# Patient Record
Sex: Male | Born: 1955 | Race: White | Hispanic: No | Marital: Married | Smoking: Never smoker
Health system: Southern US, Community
[De-identification: ages and names within clinical notes are randomized; demographics above are authoritative.]

## PROBLEM LIST (undated history)

## (undated) DIAGNOSIS — N179 Acute kidney failure, unspecified: Secondary | ICD-10-CM

## (undated) DIAGNOSIS — I10 Essential (primary) hypertension: Secondary | ICD-10-CM

## (undated) DIAGNOSIS — G4730 Sleep apnea, unspecified: Secondary | ICD-10-CM

## (undated) DIAGNOSIS — F32A Depression, unspecified: Secondary | ICD-10-CM

## (undated) SURGERY — COLONOSCOPY WITH PROPOFOL
Anesthesia: Monitor Anesthesia Care

---

## 2019-08-27 ENCOUNTER — Other Ambulatory Visit: Payer: Self-pay

## 2019-08-27 ENCOUNTER — Emergency Department (HOSPITAL_COMMUNITY)
Admission: EM | Admit: 2019-08-27 | Discharge: 2019-08-27 | Disposition: A | Discharge status: Home / Self Care | Payer: BC Managed Care – PPO | Attending: Emergency Medicine | Admitting: Emergency Medicine

## 2019-08-27 DIAGNOSIS — R040 Epistaxis: Secondary | ICD-10-CM | POA: Diagnosis not present

## 2019-08-27 DIAGNOSIS — I10 Essential (primary) hypertension: Secondary | ICD-10-CM | POA: Insufficient documentation

## 2019-08-27 MED ORDER — SILVER NITRATE-POT NITRATE 75-25 % EX MISC: 2.0000 | Freq: Once | CUTANEOUS | Status: DC

## 2019-08-27 MED ORDER — OXYMETAZOLINE HCL 0.05 % NA SOLN
1.0000 | Freq: Once | NASAL | Status: AC
  Administered 2019-08-27: 1 via NASAL
  Filled 2019-08-27: qty 30

## 2019-08-27 MED ORDER — LIDOCAINE-EPINEPHRINE (PF) 2 %-1:200000 IJ SOLN: 10.0000 mL | Freq: Once | INTRAMUSCULAR | Status: DC

## 2019-08-27 MED ORDER — LIDOCAINE-EPINEPHRINE 2 %-1:100000 IJ SOLN
20.0000 mL | Freq: Once | INTRAMUSCULAR | Status: AC
  Administered 2019-08-27: 15:00:00 20 mL via INTRADERMAL
  Filled 2019-08-27: qty 1

## 2019-08-27 NOTE — ED Provider Notes (Signed)
[facility] DEPT Provider Note   CSN: [id number] Arrival date & time: 08/27/19  1421     History Chief Complaint  Patient presents with  . Epitaxis    Nose Bleed    Adam Moreno is a 64 y.o. male.  64 y/o male with PMH HTN presenting to the ER for nose bleed. Patient reports over the past 4 days he has had intermittent nose bleed, worse at nighttime and usually stopping on its own. Reports today around 1pm having a nose bleed which did not stop so he came to the ER. Does not check BP at home. Has been taking his BP regularly. Denies nasal trauma        No past medical history on file.  There are no problems to display for this patient.        No family history on file.  Social History   Tobacco Use  . Smoking status: Not on file  Substance Use Topics  . Alcohol use: Not on file  . Drug use: Not on file    Home Medications Prior to Admission medications   Not on File    Allergies    Patient has no allergy information on record.  Review of Systems   Review of Systems  Constitutional: Negative for fever.  HENT: Positive for nosebleeds. Negative for congestion, ear discharge, sinus pressure, sinus pain and sneezing.   Respiratory: Negative for cough and shortness of breath.   Cardiovascular: Negative for chest pain.  Gastrointestinal: Negative for abdominal pain, nausea and vomiting.  Skin: Negative for rash.  Neurological: Negative for dizziness, syncope, light-headedness and headaches.  Hematological: Does not bruise/bleed easily.    Physical Exam Updated Vital Signs BP (!) 185/108   Pulse 97   Temp 98.4 F (36.9 C) (Oral)   Resp 18   SpO2 93%   Physical Exam Vitals and nursing note reviewed.  Constitutional:      General: He is not in acute distress.    Appearance: Normal appearance. He is not toxic-appearing or diaphoretic.  HENT:     Head: Normocephalic.     Nose:     Right Nostril: Epistaxis present.   Comments: Anterior nose bleed noted R nare Eyes:     Conjunctiva/sclera: Conjunctivae normal.  Pulmonary:     Effort: Pulmonary effort is normal.  Skin:    General: Skin is dry.  Neurological:     Mental Status: He is alert and oriented to person, place, and time.  Psychiatric:        Mood and Affect: Mood normal.     ED Results / Procedures / Treatments   Labs (all labs ordered are listed, but only abnormal results are displayed) Labs Reviewed - No data to display  EKG None  Radiology No results found.  Procedures .Epistaxis Management  Date/Time: 08/27/2019 3:42 PM Performed by: Alveria Apley, PA-C Authorized by: Alveria Apley, PA-C   Consent:    Consent obtained:  Verbal   Consent given by:  Patient   Risks discussed:  Bleeding, infection, nasal injury and pain   Alternatives discussed:  No treatment, alternative treatment and referral Anesthesia (see MAR for exact dosages):    Anesthesia method:  Topical application   Topical anesthetic:  Epinephrine and lidocaine gel Procedure details:    Treatment site:  R anterior   Treatment method:  Silver nitrate   Treatment complexity:  Limited   Treatment episode: recurring   Post-procedure details:  Assessment:  Bleeding stopped   Patient tolerance of procedure:  Tolerated well, no immediate complications   (including critical care time)  Medications Ordered in ED Medications  silver nitrate applicators applicator 2 Stick (has no administration in time range)  oxymetazoline (AFRIN) 0.05 % nasal spray 1 spray (1 spray Each Nare Given by Other 08/27/19 1457)  lidocaine-EPINEPHrine (XYLOCAINE W/EPI) 2 %-1:100000 (with pres) injection 20 mL (20 mLs Intradermal Given by Other 08/27/19 1457)    ED Course  I have reviewed the triage vital signs and the nursing notes.  Pertinent labs & imaging results that were available during my care of the patient were reviewed by me and considered in my medical decision making  (see chart for details).  Clinical Course as of Aug 26 1540  Fri Aug 27, 2019  1457 Patient presenting with anterior nose bleed. No lightheadedness or syncope. HTN 185/108, other vitals normal. Attempted to stop bleed with mixture of lidocaine/epinephrine and afrin nasal spray and pressure.    [KM]  [id number] Patient had improvement in the bleed with topical medications but still oozing. Had good relief with silver nitrate sticks. Sent home with return precautions.   [KM]    Clinical Course User Index [KM] Kristine Royal   MDM Rules/Calculators/A&P                      Based on review of vitals, medical screening exam, lab work and/or imaging, there does not appear to be an acute, emergent etiology for the patient's symptoms. Counseled pt on good return precautions and encouraged both PCP and ED follow-up as needed.  Prior to discharge, I also discussed incidental imaging findings with patient in detail and advised appropriate, recommended follow-up in detail.  Clinical Impression: 1. Epistaxis     Disposition: Discharge  Prior to providing a prescription for a controlled substance, I independently reviewed the patient's recent prescription history on the [facility]. The patient had no recent or regular prescriptions and was deemed appropriate for a brief, less than 3 day prescription of narcotic for acute analgesia.  This note was prepared with assistance of Systems analyst. Occasional wrong-word or sound-a-like substitutions may have occurred due to the inherent limitations of voice recognition software.  Final Clinical Impression(s) / ED Diagnoses Final diagnoses:  Epistaxis    Rx / DC Orders ED Discharge Orders    None       Kristine Royal 08/27/19 1542    Lacretia Leigh, MD 08/28/19 1343

## 2019-08-27 NOTE — ED Notes (Signed)
Pt verbalizes understanding of DC instructions. Pt belongings returned and is ambulatory out of ED.  

## 2019-08-27 NOTE — ED Triage Notes (Signed)
Pt arrived POV ambulatory into ED from home CC Nose bleed X 4 days. Pt states " every night I have one but today I just couldn't get it to stop out of right nostril. Pt reports blood is bright red but clots quickly. Pt denies facial trauma/ injury and any new meds.  Upon arrival pt had a medium sized towel soaked in blood.    Hx HTN

## 2019-08-27 NOTE — ED Notes (Signed)
Signature pad N/A

## 2019-08-27 NOTE — Discharge Instructions (Addendum)
You were seen today for a nose bleed. We applied silver nitrate sticks and a topical medication to stop the bleed. If the bleed reoccurs then spray the afrin spray and hold pressure. If the bleeding continues you may return for reevaluation. Follow up with your regular doctor for re-evaluation of your blood pressure. You may get a humidifier to keep the air moist at home and apply Vaseline for moisture. Thank you for allowing me to care for you today. Please return to the emergency department if you have new or worsening symptoms. Take your medications as instructed.

## 2019-09-06 DIAGNOSIS — D485 Neoplasm of uncertain behavior of skin: Secondary | ICD-10-CM | POA: Diagnosis not present

## 2019-09-15 DIAGNOSIS — D485 Neoplasm of uncertain behavior of skin: Secondary | ICD-10-CM | POA: Diagnosis not present

## 2020-02-28 DIAGNOSIS — Z13220 Encounter for screening for lipoid disorders: Secondary | ICD-10-CM | POA: Diagnosis not present

## 2020-02-28 DIAGNOSIS — I10 Essential (primary) hypertension: Secondary | ICD-10-CM | POA: Diagnosis not present

## 2020-02-28 DIAGNOSIS — M79605 Pain in left leg: Secondary | ICD-10-CM | POA: Diagnosis not present

## 2020-02-28 DIAGNOSIS — R7301 Impaired fasting glucose: Secondary | ICD-10-CM | POA: Diagnosis not present

## 2020-04-21 DIAGNOSIS — M5416 Radiculopathy, lumbar region: Secondary | ICD-10-CM | POA: Diagnosis not present

## 2020-04-27 ENCOUNTER — Other Ambulatory Visit: Payer: Self-pay | Admitting: Orthopedic Surgery

## 2020-04-27 DIAGNOSIS — M5416 Radiculopathy, lumbar region: Secondary | ICD-10-CM

## 2020-05-23 ENCOUNTER — Ambulatory Visit
Admission: RE | Admit: 2020-05-23 | Discharge: 2020-05-23 | Disposition: A | Discharge status: Home / Self Care | Payer: BC Managed Care – PPO | Source: Ambulatory Visit | Attending: Orthopedic Surgery | Admitting: Orthopedic Surgery

## 2020-05-23 DIAGNOSIS — M48061 Spinal stenosis, lumbar region without neurogenic claudication: Secondary | ICD-10-CM | POA: Diagnosis not present

## 2020-05-23 DIAGNOSIS — M5416 Radiculopathy, lumbar region: Secondary | ICD-10-CM

## 2020-05-23 IMAGING — MR MR LUMBAR SPINE W/O CM
4 of 5 series · 26 of 48 positions shown · non-contrast
Comparison: None.

CLINICAL DATA: Lumbar radiculopathy.  Bilateral leg numbness.

EXAM:
MRI LUMBAR SPINE WITHOUT CONTRAST
TECHNIQUE: Multiplanar, multisequence MR imaging of the lumbar spine was
performed. No intravenous contrast was administered.

[Series 3: T2 post-contrast · sagittal · 4.0mm · 0.57mm/px · 6 of 16 slices shown]
[im 1/16]
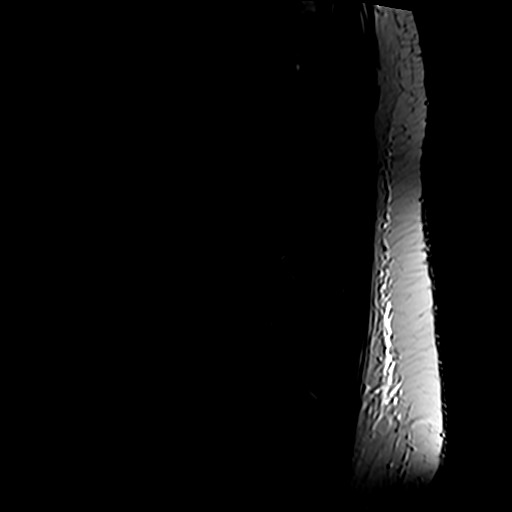
[im 4/16]
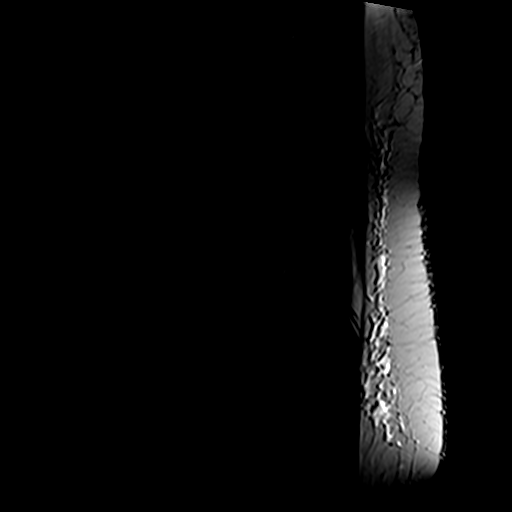
[im 7/16]
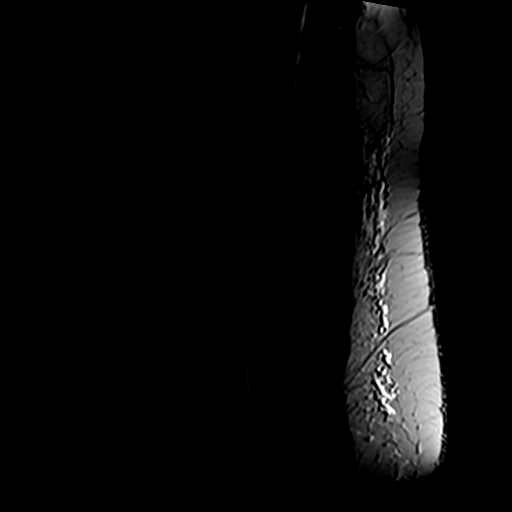
[im 10/16]
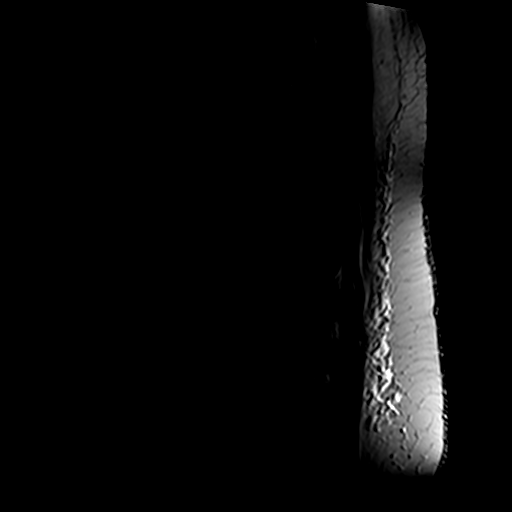
[im 13/16]
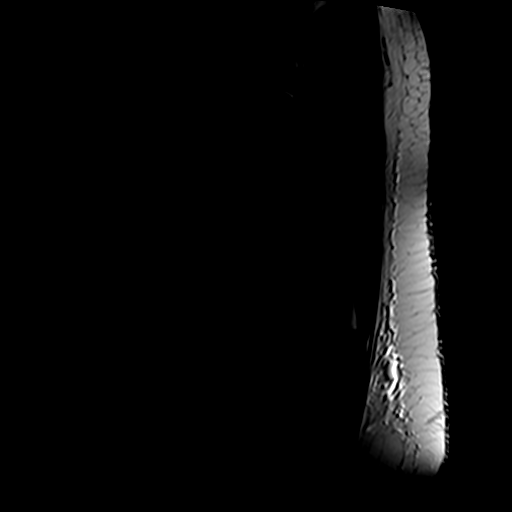
[im 16/16]
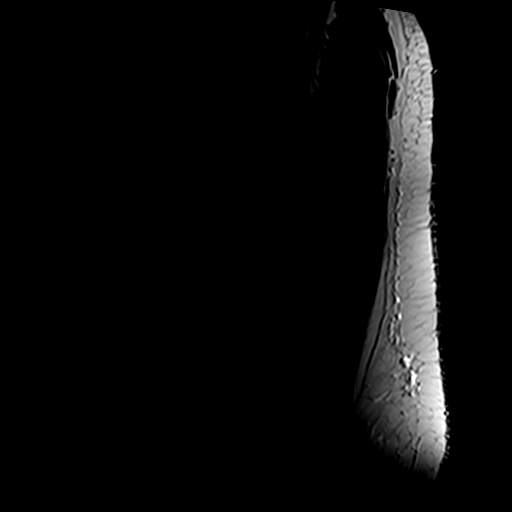

[Series 5: T1 · sagittal · 4.0mm · 0.57mm/px · 6 of 16 slices shown (1 of 2)]
[im 1/16]
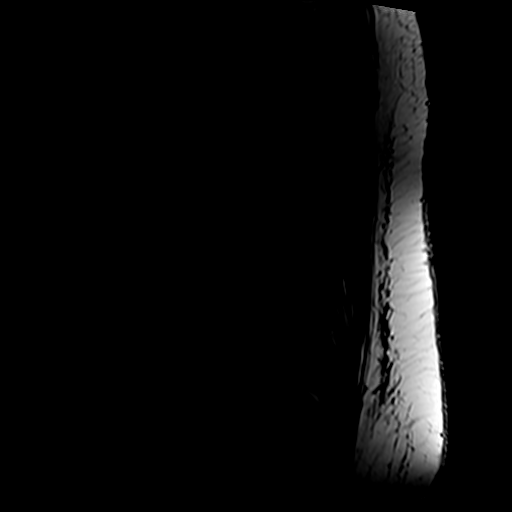
[im 4/16]
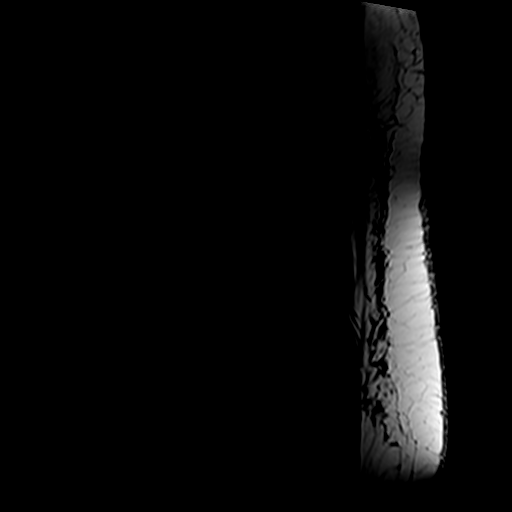
[im 7/16]
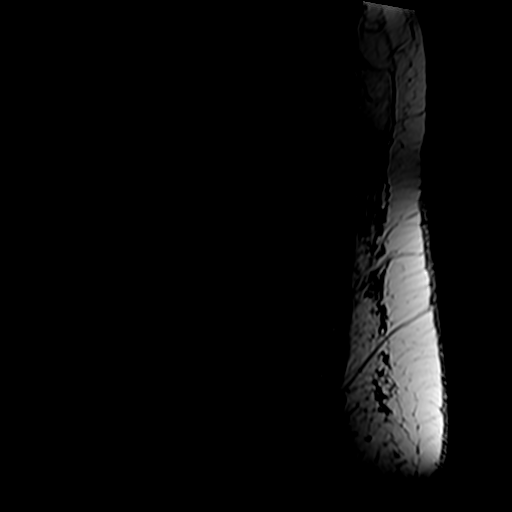
[im 10/16]
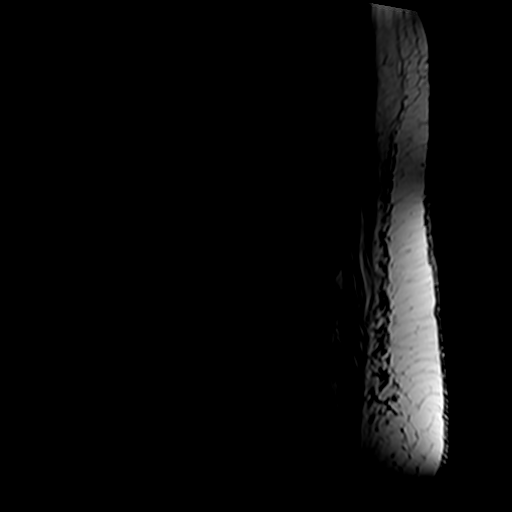
[im 13/16]
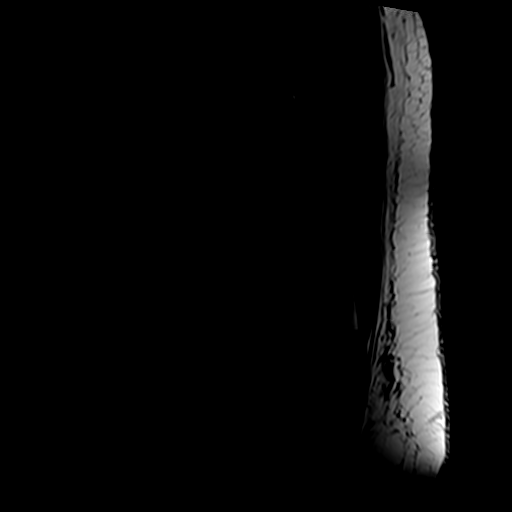
[im 16/16]
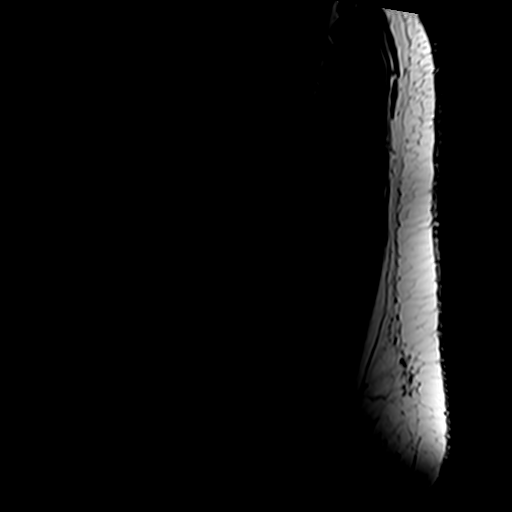

[Series 6: T2 · axial · 4.0mm · 0.70mm/px · z∈[-164,+67]mm · 9 of 41 slices shown]
[im 1/41]
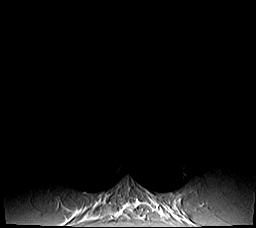
[im 6/41]
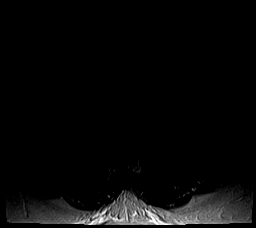
[im 12/41]
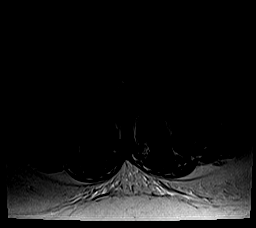
[im 18/41]
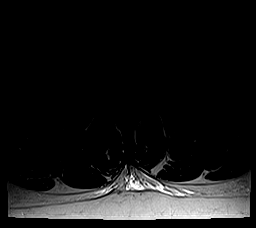
[im 21/41]
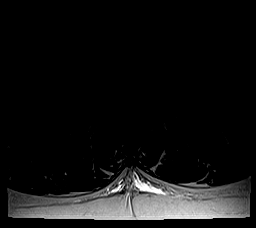
[im 23/41]
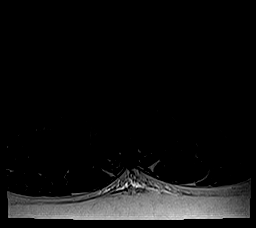
[im 29/41]
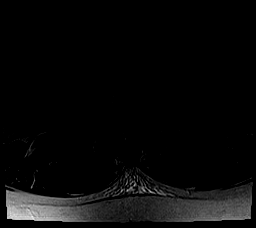
[im 35/41]
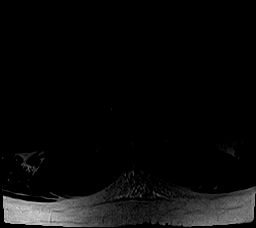
[im 41/41]
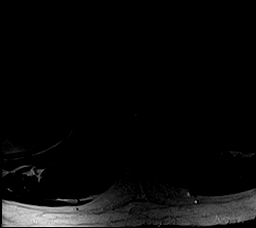

[Series 7: T1 · axial · 4.0mm · 0.35mm/px · z∈[-164,+36]mm · 5 of 42 slices shown (2 of 2)]
[im 1/42]
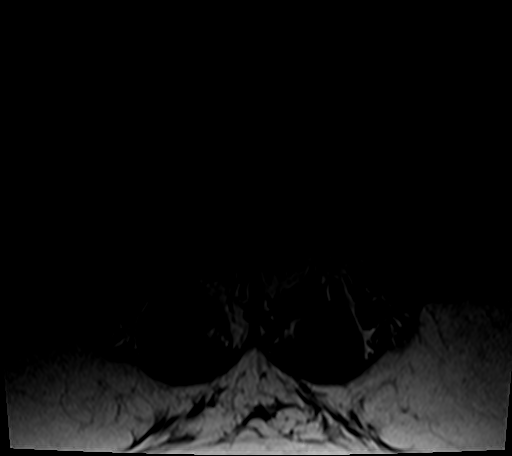
[im 6/42]
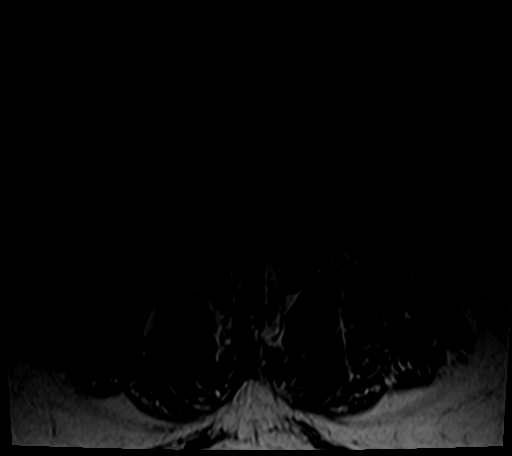
[im 12/42]
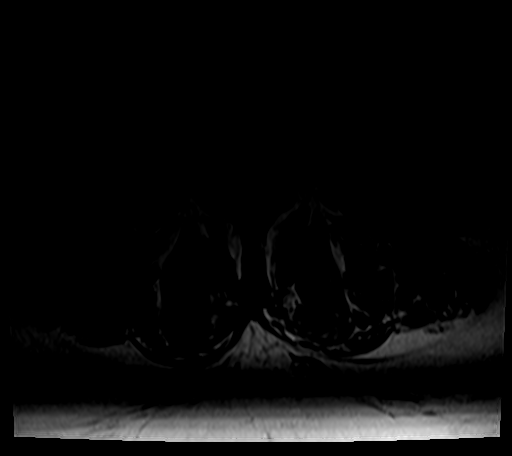
[im 21/42]
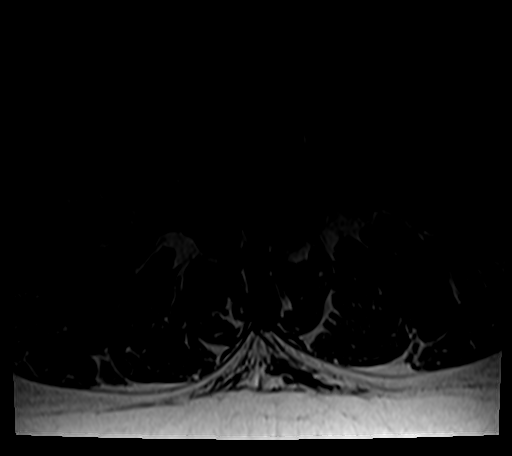
[im 36/42]
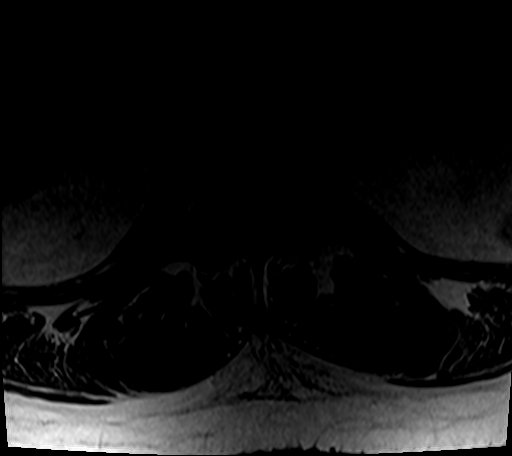

[26 of 48 positions shown; findings below may reference images not displayed]

FINDINGS: Segmentation:  Normal

Alignment:  Normal alignment with straightening of the lordosis.

Vertebrae:  Normal bone marrow.  Negative for fracture or mass.

Conus medullaris and cauda equina: Conus extends to the L1-2 level.
Conus and cauda equina appear normal.

Paraspinal and other soft tissues: Negative for paraspinous mass,
adenopathy, or fluid collection.

Disc levels:

T12-L1: Asymmetric facet hypertrophy on the left causing
subarticular stenosis.

L1-2: Normal disc. Mild facet degeneration without significant
stenosis

L2-3: Advanced facet and ligamentum flavum hypertrophy causing
moderate spinal stenosis. Mild subarticular stenosis bilaterally

L3-4: Severe facet and ligamentum flavum hypertrophy bilaterally
causing moderate to severe spinal stenosis. Moderate subarticular
stenosis bilaterally

L4-5: Mild disc degeneration and endplate spurring. Moderate facet
and ligamentum flavum hypertrophy. Mild spinal stenosis. Moderate
subarticular stenosis bilaterally

L5-S1: Small central disc protrusion. Diffuse facet hypertrophy
bilaterally. Moderate subarticular and foraminal stenosis on the
right. Mild left subarticular stenosis.
IMPRESSION: Multilevel spinal and foraminal stenosis as above. Stenosis
predominately due to multilevel facet degeneration.

## 2020-05-30 DIAGNOSIS — R7303 Prediabetes: Secondary | ICD-10-CM | POA: Diagnosis not present

## 2020-05-30 DIAGNOSIS — F3342 Major depressive disorder, recurrent, in full remission: Secondary | ICD-10-CM | POA: Diagnosis not present

## 2020-05-30 DIAGNOSIS — Z0000 Encounter for general adult medical examination without abnormal findings: Secondary | ICD-10-CM | POA: Diagnosis not present

## 2020-05-30 DIAGNOSIS — Z125 Encounter for screening for malignant neoplasm of prostate: Secondary | ICD-10-CM | POA: Diagnosis not present

## 2020-05-30 DIAGNOSIS — I10 Essential (primary) hypertension: Secondary | ICD-10-CM | POA: Diagnosis not present

## 2020-07-12 DIAGNOSIS — R202 Paresthesia of skin: Secondary | ICD-10-CM | POA: Diagnosis not present

## 2020-07-12 DIAGNOSIS — I10 Essential (primary) hypertension: Secondary | ICD-10-CM | POA: Diagnosis not present

## 2020-08-12 ENCOUNTER — Other Ambulatory Visit: Payer: Self-pay | Admitting: Gastroenterology

## 2020-09-26 ENCOUNTER — Other Ambulatory Visit: Payer: Self-pay | Admitting: Gastroenterology

## 2020-10-09 DIAGNOSIS — H6092 Unspecified otitis externa, left ear: Secondary | ICD-10-CM | POA: Diagnosis not present

## 2020-10-09 DIAGNOSIS — M26602 Left temporomandibular joint disorder, unspecified: Secondary | ICD-10-CM | POA: Diagnosis not present

## 2020-10-27 ENCOUNTER — Other Ambulatory Visit: Payer: Self-pay | Admitting: Gastroenterology

## 2020-10-30 DIAGNOSIS — I10 Essential (primary) hypertension: Secondary | ICD-10-CM | POA: Diagnosis not present

## 2020-10-30 DIAGNOSIS — R7303 Prediabetes: Secondary | ICD-10-CM | POA: Diagnosis not present

## 2020-11-01 ENCOUNTER — Encounter (HOSPITAL_COMMUNITY): Payer: Self-pay | Admitting: Gastroenterology

## 2020-11-02 ENCOUNTER — Other Ambulatory Visit: Payer: Self-pay

## 2020-11-03 ENCOUNTER — Other Ambulatory Visit (HOSPITAL_COMMUNITY)
Admission: RE | Admit: 2020-11-03 | Discharge: 2020-11-03 | Disposition: A | Discharge status: Home / Self Care | Payer: BC Managed Care – PPO | Source: Ambulatory Visit | Attending: Gastroenterology | Admitting: Gastroenterology

## 2020-11-03 DIAGNOSIS — Z01812 Encounter for preprocedural laboratory examination: Secondary | ICD-10-CM | POA: Diagnosis not present

## 2020-11-03 DIAGNOSIS — Z20822 Contact with and (suspected) exposure to covid-19: Secondary | ICD-10-CM | POA: Insufficient documentation

## 2020-11-04 LAB — SARS CORONAVIRUS 2 (TAT 6-24 HRS): SARS Coronavirus 2: NEGATIVE

## 2020-11-07 ENCOUNTER — Encounter (HOSPITAL_COMMUNITY): Payer: Self-pay | Admitting: Gastroenterology

## 2020-11-07 ENCOUNTER — Encounter (HOSPITAL_COMMUNITY)
Admission: RE | Disposition: A | Discharge status: Home / Self Care | Payer: Self-pay | Source: Ambulatory Visit | Attending: Gastroenterology

## 2020-11-07 ENCOUNTER — Other Ambulatory Visit: Payer: Self-pay

## 2020-11-07 ENCOUNTER — Ambulatory Visit (HOSPITAL_COMMUNITY)
Admission: RE | Admit: 2020-11-07 | Discharge: 2020-11-07 | Disposition: A | Discharge status: Home / Self Care | Payer: BC Managed Care – PPO | Source: Ambulatory Visit | Attending: Gastroenterology | Admitting: Gastroenterology

## 2020-11-07 ENCOUNTER — Ambulatory Visit (HOSPITAL_COMMUNITY): Payer: BC Managed Care – PPO | Admitting: Anesthesiology

## 2020-11-07 DIAGNOSIS — D124 Benign neoplasm of descending colon: Secondary | ICD-10-CM | POA: Diagnosis not present

## 2020-11-07 DIAGNOSIS — Z825 Family history of asthma and other chronic lower respiratory diseases: Secondary | ICD-10-CM | POA: Insufficient documentation

## 2020-11-07 DIAGNOSIS — K5730 Diverticulosis of large intestine without perforation or abscess without bleeding: Secondary | ICD-10-CM | POA: Insufficient documentation

## 2020-11-07 DIAGNOSIS — Z79899 Other long term (current) drug therapy: Secondary | ICD-10-CM | POA: Insufficient documentation

## 2020-11-07 DIAGNOSIS — I10 Essential (primary) hypertension: Secondary | ICD-10-CM | POA: Diagnosis not present

## 2020-11-07 DIAGNOSIS — Z1211 Encounter for screening for malignant neoplasm of colon: Secondary | ICD-10-CM | POA: Insufficient documentation

## 2020-11-07 DIAGNOSIS — D125 Benign neoplasm of sigmoid colon: Secondary | ICD-10-CM | POA: Diagnosis not present

## 2020-11-07 DIAGNOSIS — K641 Second degree hemorrhoids: Secondary | ICD-10-CM | POA: Diagnosis not present

## 2020-11-07 DIAGNOSIS — Z86010 Personal history of colonic polyps: Secondary | ICD-10-CM | POA: Diagnosis not present

## 2020-11-07 DIAGNOSIS — K640 First degree hemorrhoids: Secondary | ICD-10-CM | POA: Insufficient documentation

## 2020-11-07 DIAGNOSIS — Z8249 Family history of ischemic heart disease and other diseases of the circulatory system: Secondary | ICD-10-CM | POA: Insufficient documentation

## 2020-11-07 DIAGNOSIS — K635 Polyp of colon: Secondary | ICD-10-CM | POA: Diagnosis not present

## 2020-11-07 DIAGNOSIS — D123 Benign neoplasm of transverse colon: Secondary | ICD-10-CM | POA: Diagnosis not present

## 2020-11-07 HISTORY — PX: COLONOSCOPY WITH PROPOFOL: SHX5780

## 2020-11-07 HISTORY — PX: POLYPECTOMY: SHX5525

## 2020-11-07 HISTORY — DX: Essential (primary) hypertension: I10

## 2020-11-07 HISTORY — DX: Sleep apnea, unspecified: G47.30

## 2020-11-07 HISTORY — PX: BIOPSY: SHX5522

## 2020-11-07 SURGERY — COLONOSCOPY WITH PROPOFOL
Anesthesia: Monitor Anesthesia Care

## 2020-11-07 MED ORDER — PROPOFOL 500 MG/50ML IV EMUL
INTRAVENOUS | Status: DC | PRN
  Administered 2020-11-07: 30 mg via INTRAVENOUS

## 2020-11-07 MED ORDER — LIDOCAINE HCL (CARDIAC) PF 100 MG/5ML IV SOSY
PREFILLED_SYRINGE | INTRAVENOUS | Status: DC | PRN
  Administered 2020-11-07: 60 mg via INTRAVENOUS

## 2020-11-07 MED ORDER — SODIUM CHLORIDE 0.9 % IV SOLN: INTRAVENOUS | Status: DC

## 2020-11-07 MED ORDER — PROPOFOL 500 MG/50ML IV EMUL
INTRAVENOUS | Status: DC | PRN
  Administered 2020-11-07: 100 ug/kg/min via INTRAVENOUS

## 2020-11-07 MED ORDER — PROPOFOL 500 MG/50ML IV EMUL
INTRAVENOUS | Status: AC
  Filled 2020-11-07: qty 50

## 2020-11-07 MED ORDER — LACTATED RINGERS IV SOLN: INTRAVENOUS | Status: DC | PRN

## 2020-11-07 SURGICAL SUPPLY — 21 items

## 2020-11-07 NOTE — Anesthesia Preprocedure Evaluation (Addendum)
Anesthesia Evaluation  Patient identified by MRN, date of birth, ID band Patient awake    Reviewed: Allergy & Precautions, NPO status , Patient's Chart, lab work & pertinent test results, reviewed documented beta blocker date and time   Airway Mallampati: III  TM Distance: >3 FB Neck ROM: Full    Dental no notable dental hx. (+) Teeth Intact, Dental Advisory Given   Pulmonary sleep apnea (PCP is "certain" he has sleep apnea but hasnt had formal sleep stuydy yet) ,    Pulmonary exam normal breath sounds clear to auscultation       Cardiovascular hypertension, Pt. on medications and Pt. on home beta blockers Normal cardiovascular exam Rhythm:Regular Rate:Normal  BP in preop 174/80- per pt, this is higher than usual. hasn't taken any BP meds today    Neuro/Psych negative neurological ROS  negative psych ROS   GI/Hepatic Neg liver ROS, Hx colon polyps    Endo/Other  Morbid obesityBMI 47  Renal/GU negative Renal ROS  negative genitourinary   Musculoskeletal negative musculoskeletal ROS (+)   Abdominal (+) + obese,   Peds  Hematology negative hematology ROS (+)   Anesthesia Other Findings   Reproductive/Obstetrics negative OB ROS                            Anesthesia Physical Anesthesia Plan  ASA: III  Anesthesia Plan: MAC   Post-op Pain Management:    Induction:   PONV Risk Score and Plan: Treatment may vary due to age or medical condition, Propofol infusion and TIVA  Airway Management Planned: Natural Airway and Simple Face Mask  Additional Equipment: None  Intra-op Plan:   Post-operative Plan:   Informed Consent: I have reviewed the patients History and Physical, chart, labs and discussed the procedure including the risks, benefits and alternatives for the proposed anesthesia with the patient or authorized representative who has indicated his/her understanding and acceptance.        Plan Discussed with: CRNA  Anesthesia Plan Comments:        Anesthesia Quick Evaluation

## 2020-11-07 NOTE — H&P (Signed)
Date of Initial H&P: 10/31/20  History reviewed, patient examined, no change in status, stable for surgery.

## 2020-11-07 NOTE — Transfer of Care (Signed)
Immediate Anesthesia Transfer of Care Note  Patient: Adam Moreno  Procedure(s) Performed: Procedure(s): COLONOSCOPY WITH PROPOFOL (N/A) POLYPECTOMY BIOPSY  Patient Location: PACU  Anesthesia Type:General  Level of Consciousness:  sedated, patient cooperative and responds to stimulation  Airway & Oxygen Therapy:Patient Spontanous Breathing and Patient connected to face mask oxgen  Post-op Assessment:  Report given to PACU RN and Post -op Vital signs reviewed and stable  Post vital signs:  Reviewed and stable  Last Vitals:  Vitals:   11/07/20 1023  BP: (!) 174/80  Pulse: 80  Resp: (!) 21  Temp: 36.5 C  SpO2: 93%    Complications: No apparent anesthesia complications

## 2020-11-07 NOTE — Interval H&P Note (Signed)
History and Physical Interval Note:  11/07/2020 11:36 AM  Adam Moreno  has presented today for surgery, with the diagnosis of history of colon polyps.  The various methods of treatment have been discussed with the patient and family. After consideration of risks, benefits and other options for treatment, the patient has consented to  Procedure(s): COLONOSCOPY WITH PROPOFOL (N/A) as a surgical intervention.  The patient's history has been reviewed, patient examined, no change in status, stable for surgery.  I have reviewed the patient's chart and labs.  Questions were answered to the patient's satisfaction.     [name]

## 2020-11-07 NOTE — Anesthesia Postprocedure Evaluation (Signed)
Anesthesia Post Note  Patient: Adam Moreno  Procedure(s) Performed: COLONOSCOPY WITH PROPOFOL (N/A ) POLYPECTOMY BIOPSY     Patient location during evaluation: PACU Anesthesia Type: MAC Level of consciousness: awake and alert Pain management: pain level controlled Vital Signs Assessment: post-procedure vital signs reviewed and stable Respiratory status: spontaneous breathing, nonlabored ventilation and respiratory function stable Cardiovascular status: blood pressure returned to baseline and stable Postop Assessment: no apparent nausea or vomiting Anesthetic complications: no   No complications documented.  Last Vitals:  Vitals:   11/07/20 1241 11/07/20 1251  BP: 140/61 (!) 151/74  Pulse: 78 77  Resp: 16 (!) 22  Temp:    SpO2: 96% 97%    Last Pain:  Vitals:   11/07/20 1251  TempSrc:   PainSc: 0-No pain                 Pervis Hocking

## 2020-11-07 NOTE — Discharge Instructions (Signed)

## 2020-11-07 NOTE — Op Note (Signed)
[facility] Patient Name: Adam Moreno Procedure Date: 11/07/2020 MRN: [id number] Attending MD: [name] , MD Date of Birth: 10-Apr-1956 CSN: [id number] Age: 65 Admit Type: Outpatient Procedure:                Colonoscopy Indications:              High risk colon cancer surveillance: Personal                            history of colonic polyps, Last colonoscopy:                            September 2015 Providers:                [name], MD, Cleda Daub, RN, Benetta Spar, Technician Referring MD:             Sela Hilding Medicines:                Propofol per Anesthesia, Monitored Anesthesia Care Complications:            No immediate complications. Estimated Blood Loss:     Estimated blood loss was minimal. Procedure:                Pre-Anesthesia Assessment:                           - Prior to the procedure, a History and Physical                            was performed, and patient medications and                            allergies were reviewed. The patient's tolerance of                            previous anesthesia was also reviewed. The risks                            and benefits of the procedure and the sedation                            options and risks were discussed with the patient.                            All questions were answered, and informed consent                            was obtained. Prior Anticoagulants: The patient has                            taken no previous anticoagulant or antiplatelet  agents. ASA Grade Assessment: III - A patient with                            severe systemic disease. After reviewing the risks                            and benefits, the patient was deemed in                            satisfactory condition to undergo the procedure.                           After obtaining informed consent, the colonoscope                             was passed under direct vision. Throughout the                            procedure, the patient's blood pressure, pulse, and                            oxygen saturations were monitored continuously. The                            PCF-H190DL ([id number] Olympus pediatric colonscope                            was introduced through the anus and advanced to the                            the cecum, identified by appendiceal orifice and                            ileocecal valve. The colonoscopy was performed                            without difficulty. The patient tolerated the                            procedure well. The quality of the bowel                            preparation was fair. The terminal ileum, ileocecal                            valve, appendiceal orifice, and rectum were                            photographed. Scope In: 11:49:04 AM Scope Out: 12:25:32 PM Scope Withdrawal Time: 0 hours 28 minutes 25 seconds  Total Procedure Duration: 0 hours 36 minutes 28 seconds  Findings:      The perianal and digital rectal examinations were normal.      A 14 mm polyp was found in the descending colon. The polyp  was sessile.       The polyp was removed with a hot snare. Resection and retrieval were       complete. Estimated blood loss: none.      Four sessile and semi-sessile polyps were found in the sigmoid colon and       descending colon. The polyps were 4 to 10 mm in size. These polyps were       removed with a hot snare. Resection and retrieval were complete.       Estimated blood loss: none.      A 2 mm polyp was found in the sigmoid colon. The polyp was semi-sessile.       The polyp was removed with a cold biopsy forceps. Resection and       retrieval were complete. Estimated blood loss was minimal.      A 3 mm polyp was found in the sigmoid colon. The polyp was semi-sessile.       The polyp was removed with a cold snare. Resection and retrieval were        complete. Estimated blood loss was minimal.      A 4 mm polyp was found in the transverse colon. The polyp was       semi-sessile. The polyp was removed with a hot snare. Resection and       retrieval were complete. Estimated blood loss: none.      A 20 mm polyp was found in the transverse colon. The polyp was sessile.       The polyp was removed with a hot snare. Resection and retrieval were       complete. Estimated blood loss: none.      Multiple small and large-mouthed diverticula were found in the sigmoid       colon.      Internal hemorrhoids were found during retroflexion. The hemorrhoids       were medium-sized and Grade I (internal hemorrhoids that do not       prolapse).      A tattoo was seen in the sigmoid colon. A post-polypectomy scar was       found at the tattoo site. There was no evidence of residual polyp tissue.      The terminal ileum appeared normal. Impression:               - Preparation of the colon was fair.                           - One 14 mm polyp in the descending colon, removed                            with a hot snare. Resected and retrieved.                           - Four 4 to 10 mm polyps in the sigmoid colon and                            in the descending colon, removed with a hot snare.                            Resected and retrieved.                           -  One 2 mm polyp in the sigmoid colon, removed with                            a cold biopsy forceps. Resected and retrieved.                           - One 3 mm polyp in the sigmoid colon, removed with                            a cold snare. Resected and retrieved.                           - One 4 mm polyp in the transverse colon, removed                            with a hot snare. Resected and retrieved.                           - One 20 mm polyp in the transverse colon, removed                            with a hot snare. Resected and retrieved.                           -  Diverticulosis in the sigmoid colon.                           - Internal hemorrhoids.                           - A tattoo was seen in the sigmoid colon. A                            post-polypectomy scar was found at the tattoo site.                            There was no evidence of residual polyp tissue.                           - The examined portion of the ileum was normal. Moderate Sedation:      Not Applicable - Patient had care per Anesthesia. Recommendation:           - Patient has a contact number available for                            emergencies. The signs and symptoms of potential                            delayed complications were discussed with the                            patient. Return to normal activities tomorrow.  Written discharge instructions were provided to the                            patient.                           - High fiber diet.                           - Await pathology results.                           - Repeat colonoscopy for surveillance based on                            pathology results.                           - No aspirin, ibuprofen, naproxen, or other                            non-steroidal anti-inflammatory drugs for 2 weeks. Procedure Code(s):        --- Professional ---                           [phone], Colonoscopy, flexible; with removal of                            tumor(s), polyp(s), or other lesion(s) by snare                            technique                           [id number], 15, Colonoscopy, flexible; with biopsy,                            single or multiple Diagnosis Code(s):        --- Professional ---                           Z86.010, Personal history of colonic polyps                           K63.5, Polyp of colon                           K64.0, First degree hemorrhoids                           K57.30, Diverticulosis of large intestine without                             perforation or abscess without bleeding CPT copyright 2019 [employer]. All rights reserved. The codes documented in this report are preliminary and upon coder review may  be revised to meet current compliance requirements. [name], MD 11/07/2020 12:44:26 PM This report has  been signed electronically. Number of Addenda: 0

## 2020-11-08 ENCOUNTER — Encounter (HOSPITAL_COMMUNITY): Payer: Self-pay | Admitting: Gastroenterology

## 2020-11-08 LAB — SURGICAL PATHOLOGY

## 2021-06-08 DIAGNOSIS — R7303 Prediabetes: Secondary | ICD-10-CM | POA: Diagnosis not present

## 2021-06-08 DIAGNOSIS — Z0000 Encounter for general adult medical examination without abnormal findings: Secondary | ICD-10-CM | POA: Diagnosis not present

## 2021-06-08 DIAGNOSIS — Z13220 Encounter for screening for lipoid disorders: Secondary | ICD-10-CM | POA: Diagnosis not present

## 2021-06-08 DIAGNOSIS — F3342 Major depressive disorder, recurrent, in full remission: Secondary | ICD-10-CM | POA: Diagnosis not present

## 2021-06-08 DIAGNOSIS — I10 Essential (primary) hypertension: Secondary | ICD-10-CM | POA: Diagnosis not present

## 2021-06-08 DIAGNOSIS — Z23 Encounter for immunization: Secondary | ICD-10-CM | POA: Diagnosis not present

## 2021-06-08 DIAGNOSIS — R0609 Other forms of dyspnea: Secondary | ICD-10-CM | POA: Diagnosis not present

## 2021-06-08 DIAGNOSIS — Z125 Encounter for screening for malignant neoplasm of prostate: Secondary | ICD-10-CM | POA: Diagnosis not present

## 2021-06-13 NOTE — Progress Notes (Signed)
  Patient referred by Timberlake, Kathryn S, * for shortness of breath  Subjective:   Adam Moreno, male    DOB: 09/17/1955, 65 y.o.   MRN: [id number]   Chief Complaint  Patient presents with   DOE   New Patient (Initial Visit)    HPI  65 y.o. Caucasian male with hypertension, obesity, OSA not on CPAP, referred for evaluation of shortness of breath.  Patient moved to [place] from [place] 3 years ago.  He is worked in private [employer].  His physical activity is limited due to orthopedic issues, especially with his knees.  He has had exertional dyspnea for last several years with no significant recent change.  He denies any chest pain.  He has been overweight and obese for a long time.  He has mild edema, denies any orthopnea, PND symptoms.   Past Medical History:  Diagnosis Date   Hypertension    Sleep apnea    doesn't wear CPAP     Past Surgical History:  Procedure Laterality Date   BIOPSY  11/07/2020   Procedure: BIOPSY;  Surgeon: Schooler, Vincent, MD;  Location: [facility];  Service: Endoscopy;;   COLONOSCOPY WITH PROPOFOL N/A 11/07/2020   Procedure: COLONOSCOPY WITH PROPOFOL;  Surgeon: Schooler, Vincent, MD;  Location: [facility];  Service: Endoscopy;  Laterality: N/A;   POLYPECTOMY  11/07/2020   Procedure: POLYPECTOMY;  Surgeon: Schooler, Vincent, MD;  Location: [facility];  Service: Endoscopy;;     Social History   Tobacco Use  Smoking Status Never  Smokeless Tobacco Never    Social History   Substance and Sexual Activity  Alcohol Use None     Family History  Problem Relation Age of Onset   Heart attack Father 60       only 1   Hypertension Brother      Current Outpatient Medications on File Prior to Visit  Medication Sig Dispense Refill   amLODipine (NORVASC) 10 MG tablet Take 10 mg by mouth daily.     Cholecalciferol (DIALYVITE VITAMIN D 5000) 125 MCG (5000 UT) capsule Take 5,000 Units by mouth daily.     DULoxetine (CYMBALTA) 60  MG capsule Take 60 mg by mouth daily.     metoprolol succinate (TOPROL-XL) 25 MG 24 hr tablet Take 25 mg by mouth daily.     valsartan-hydrochlorothiazide (DIOVAN-HCT) 320-25 MG tablet Take 1 tablet by mouth daily.     No current facility-administered medications on file prior to visit.    Cardiovascular and other pertinent studies:  EKG 06/14/2021: Sinus rhythm 89 bpm Normal EKG     Recent labs: 06/08/2021: Glucose 110, BUN/Cr 24/0.92. EGFR 93. Na/K 3.8/101. Rest of the CMP normal H/H 15.2/44.6. MCV 85.4. Platelets 269 HbA1C 6.1% Chol 164, TG 134, HDL 35, LDL 105 TSH 2.49 normal    Review of Systems  Cardiovascular:  Positive for dyspnea on exertion and leg swelling. Negative for chest pain, palpitations and syncope.        Vitals:   06/14/21 1319  BP: 139/82  Pulse: 94  Resp: 16  Temp: 98 F (36.7 C)  SpO2: 95%     Body mass index is 45.75 kg/m. Filed Weights   06/14/21 1319  Weight: (!) 328 lb (148.8 kg)     Objective:   Physical Exam Vitals and nursing note reviewed.  Constitutional:      General: He is not in acute distress. Neck:     Vascular: No JVD.  Cardiovascular:       Rate and Rhythm: Normal rate and regular rhythm.     Heart sounds: Normal heart sounds. No murmur heard. Pulmonary:     Effort: Pulmonary effort is normal.     Breath sounds: Normal breath sounds. No wheezing or rales.  Musculoskeletal:     Right lower leg: Edema (Trace) present.     Left lower leg: Edema (Trace) present.        Assessment & Recommendations:   65 y.o. Caucasian male with hypertension, obesity, OSA not on CPAP, referred for evaluation of shortness of breath.  Exertional dyspnea: Likely related to obesity, deconditioning, untreated obstructive sleep apnea.  Low suspicion for ischemic etiology.  Will obtain echocardiogram.  Recommended Lasix for as needed use.  Discussed low-salt diet, calorie negative diet, and increase physical activity as tolerated by  his orthopedic issues.  Further recommendations after echocardiogram.   Thank you for referring the patient to us. Please feel free to contact with any questions.    J , MD Pager: [phone] Office: [phone]  

## 2021-06-14 ENCOUNTER — Ambulatory Visit: Payer: BC Managed Care – PPO | Admitting: Cardiology

## 2021-06-14 ENCOUNTER — Other Ambulatory Visit: Payer: Self-pay

## 2021-06-14 ENCOUNTER — Encounter: Payer: Self-pay | Admitting: Cardiology

## 2021-06-14 VITALS — BP 139/82 | HR 94 | Temp 98.0°F | Resp 16 | Ht 71.0 in | Wt 328.0 lb

## 2021-06-14 DIAGNOSIS — R002 Palpitations: Secondary | ICD-10-CM | POA: Diagnosis not present

## 2021-06-14 DIAGNOSIS — R0609 Other forms of dyspnea: Secondary | ICD-10-CM

## 2021-06-14 MED ORDER — FUROSEMIDE 20 MG PO TABS: 20.0000 mg | ORAL_TABLET | Freq: Every day | ORAL | 3 refills | Status: DC

## 2021-06-15 ENCOUNTER — Encounter: Payer: Self-pay | Admitting: Cardiology

## 2021-06-15 DIAGNOSIS — R0609 Other forms of dyspnea: Secondary | ICD-10-CM | POA: Insufficient documentation

## 2021-07-11 ENCOUNTER — Other Ambulatory Visit: Payer: BC Managed Care – PPO

## 2021-07-19 ENCOUNTER — Ambulatory Visit: Payer: BC Managed Care – PPO

## 2021-07-19 ENCOUNTER — Other Ambulatory Visit: Payer: Self-pay

## 2021-07-19 DIAGNOSIS — R0609 Other forms of dyspnea: Secondary | ICD-10-CM | POA: Diagnosis not present

## 2021-07-23 NOTE — Progress Notes (Signed)
Called and spoke with patient regarding his echocardiogram results.  ?

## 2021-08-07 NOTE — Progress Notes (Signed)
Patient referred by Glenis Smoker, * for shortness of breath  Subjective:   Adam Moreno, male    DOB: [birth year], 66 y.o.   MRN: [id number]   Chief Complaint  Patient presents with   Shortness of Breath   Follow-up    4 week   Results    echo    HPI  66 y.o. Caucasian male with hypertension, obesity, OSA not on CPAP, referred for evaluation of shortness of breath  Reviewed recent echocardiogram results with the patient, details below.   Initial consultation visit 06/2021: Patient moved to [facility] from [place] 3 years ago.  He works in [employer].  His physical activity is limited due to orthopedic issues, especially with his knees.  He has had exertional dyspnea for last several years with no significant recent change.  He denies any chest pain.  He has been overweight and obese for a long time.  He has mild edema, denies any orthopnea, PND symptoms.  Current Outpatient Medications on File Prior to Visit  Medication Sig Dispense Refill   amLODipine (NORVASC) 10 MG tablet Take 10 mg by mouth daily.     Cholecalciferol (DIALYVITE VITAMIN D 5000) 125 MCG (5000 UT) capsule Take 5,000 Units by mouth daily.     DULoxetine (CYMBALTA) 60 MG capsule Take 60 mg by mouth daily.     furosemide (LASIX) 20 MG tablet Take 1 tablet (20 mg total) by mouth daily. 30 tablet 3   metoprolol succinate (TOPROL-XL) 25 MG 24 hr tablet Take 25 mg by mouth daily.     valsartan-hydrochlorothiazide (DIOVAN-HCT) 320-25 MG tablet Take 1 tablet by mouth daily.     No current facility-administered medications on file prior to visit.    Cardiovascular and other pertinent studies:  EKG 06/14/2021: Sinus rhythm 89 bpm Normal EKG   Echocardiogram 07/19/2021:  Study Quality: Technically difficult study.  Normal LV systolic function with visual EF 55-60%. Left ventricle cavity  is normal in size. Moderate left ventricular hypertrophy. Normal global  wall motion. Normal  diastolic filling pattern, normal LAP.  No significant valvular heart disease.  No prior study for comparison.  Recent labs: 06/08/2021: Glucose 110, BUN/Cr 24/0.92. EGFR 93. Na/K 3.8/101. Rest of the CMP normal H/H 15.2/44.6. MCV 85.4. Platelets 269 HbA1C 6.1% Chol 164, TG 134, HDL 35, LDL 105 TSH 2.49 normal    Review of Systems  Cardiovascular:  Positive for dyspnea on exertion and leg swelling. Negative for chest pain, palpitations and syncope.        Vitals:   08/08/21 1048  BP: 124/80  Pulse: 72  Resp: 16  Temp: 98 F (36.7 C)  SpO2: 95%    Body mass index is 48.4 kg/m. Filed Weights   08/08/21 1048  Weight: (!) 347 lb (157.4 kg)     Objective:   Physical Exam Vitals and nursing note reviewed.  Constitutional:      General: He is not in acute distress. Neck:     Vascular: No JVD.  Cardiovascular:     Rate and Rhythm: Normal rate and regular rhythm.     Heart sounds: Normal heart sounds. No murmur heard. Pulmonary:     Effort: Pulmonary effort is normal.     Breath sounds: Normal breath sounds. No wheezing or rales.  Musculoskeletal:     Right lower leg: Edema (Trace) present.     Left lower leg: Edema (Trace) present.        Assessment & Recommendations:  66 y.o. Caucasian male with hypertension, obesity, OSA not on CPAP, referred for evaluation of shortness of breath.  Exertional dyspnea: Likely related to obesity, deconditioning, obstructive sleep apnea.  Low suspicion for ischemic etiology.   Echocardiogram with no significant abnormalities. Recommended Lasix for as needed use.  Discussed low-salt diet, calorie negative diet, and increase physical activity as tolerated by his orthopedic issues. Could consider pulmonary evaluation.  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 He is prediabetic with A1C 6.1%. Recommend Crestor 10 mg daily.   F/u in 6 months   Nigel Mormon, MD Pager: [phone] Office: [phone]

## 2021-08-08 ENCOUNTER — Ambulatory Visit: Payer: BC Managed Care – PPO | Admitting: Cardiology

## 2021-08-08 ENCOUNTER — Other Ambulatory Visit: Payer: Self-pay

## 2021-08-08 ENCOUNTER — Encounter: Payer: Self-pay | Admitting: Cardiology

## 2021-08-08 VITALS — BP 124/80 | HR 72 | Temp 98.0°F | Resp 16 | Ht 71.0 in | Wt 347.0 lb

## 2021-08-08 DIAGNOSIS — E782 Mixed hyperlipidemia: Secondary | ICD-10-CM | POA: Diagnosis not present

## 2021-08-08 DIAGNOSIS — R0609 Other forms of dyspnea: Secondary | ICD-10-CM | POA: Diagnosis not present

## 2021-08-08 MED ORDER — ROSUVASTATIN CALCIUM 10 MG PO TABS: 10.0000 mg | ORAL_TABLET | Freq: Every day | ORAL | 3 refills | Status: AC

## 2021-09-14 ENCOUNTER — Other Ambulatory Visit: Payer: Self-pay | Admitting: Cardiology

## 2021-09-14 DIAGNOSIS — R0609 Other forms of dyspnea: Secondary | ICD-10-CM

## 2021-11-14 ENCOUNTER — Emergency Department (HOSPITAL_COMMUNITY)
Admission: EM | Admit: 2021-11-14 | Discharge: 2021-11-14 | Disposition: A | Discharge status: Home / Self Care | Payer: BC Managed Care – PPO | Attending: Emergency Medicine | Admitting: Emergency Medicine

## 2021-11-14 ENCOUNTER — Encounter (HOSPITAL_COMMUNITY): Payer: Self-pay | Admitting: Emergency Medicine

## 2021-11-14 ENCOUNTER — Emergency Department (HOSPITAL_COMMUNITY): Payer: BC Managed Care – PPO

## 2021-11-14 DIAGNOSIS — R062 Wheezing: Secondary | ICD-10-CM | POA: Diagnosis not present

## 2021-11-14 DIAGNOSIS — D3616 Benign neoplasm of peripheral nerves and autonomic nervous system of pelvis: Secondary | ICD-10-CM | POA: Diagnosis not present

## 2021-11-14 DIAGNOSIS — D3610 Benign neoplasm of peripheral nerves and autonomic nervous system, unspecified: Secondary | ICD-10-CM | POA: Insufficient documentation

## 2021-11-14 DIAGNOSIS — R0902 Hypoxemia: Secondary | ICD-10-CM | POA: Diagnosis not present

## 2021-11-14 DIAGNOSIS — I10 Essential (primary) hypertension: Secondary | ICD-10-CM | POA: Diagnosis not present

## 2021-11-14 DIAGNOSIS — M2578 Osteophyte, vertebrae: Secondary | ICD-10-CM | POA: Diagnosis not present

## 2021-11-14 DIAGNOSIS — G459 Transient cerebral ischemic attack, unspecified: Secondary | ICD-10-CM | POA: Diagnosis not present

## 2021-11-14 DIAGNOSIS — R471 Dysarthria and anarthria: Secondary | ICD-10-CM | POA: Insufficient documentation

## 2021-11-14 DIAGNOSIS — Z8673 Personal history of transient ischemic attack (TIA), and cerebral infarction without residual deficits: Secondary | ICD-10-CM | POA: Diagnosis not present

## 2021-11-14 DIAGNOSIS — Z79899 Other long term (current) drug therapy: Secondary | ICD-10-CM | POA: Insufficient documentation

## 2021-11-14 IMAGING — CT CT HEAD W/O CM
3 series · 15 of 47 positions shown, 18 images · non-contrast
Comparison: None.

CLINICAL DATA: TIA



[Series 2: head wo · axial · 0.48mm/px · z∈[-80,+65]mm · 9 of 35 slices shown, 12 images]
[im 3/35  brain]
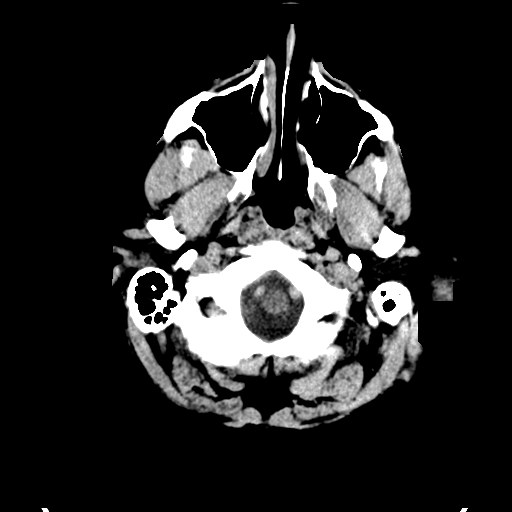
[im 3/35  bone]
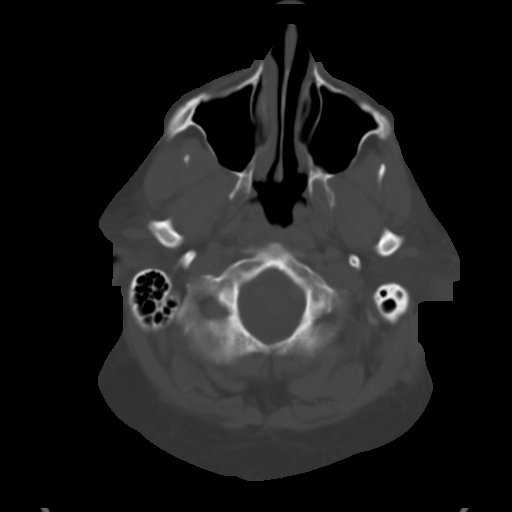
[im 6/35  brain]
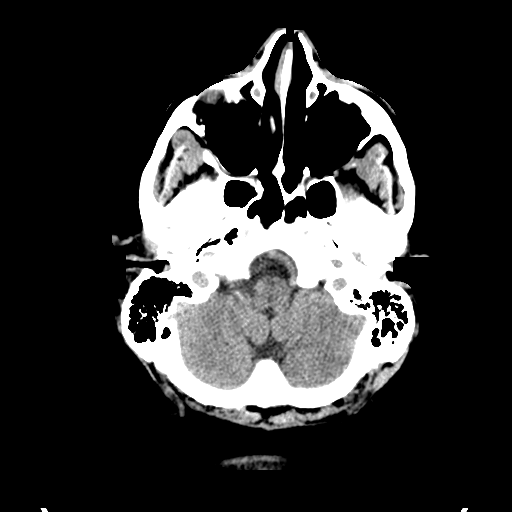
[im 10/35  brain]
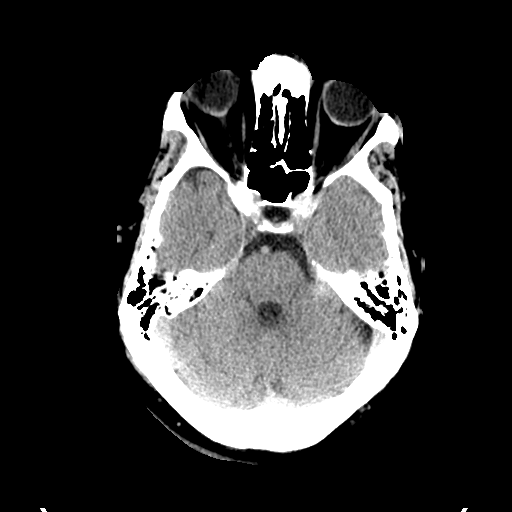
[im 13/35  brain]
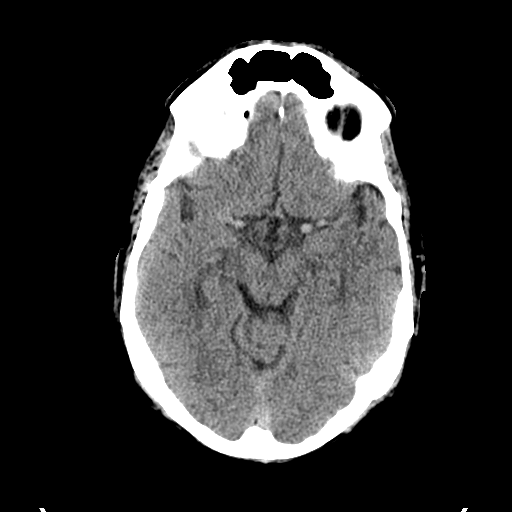
[im 18/35  brain]
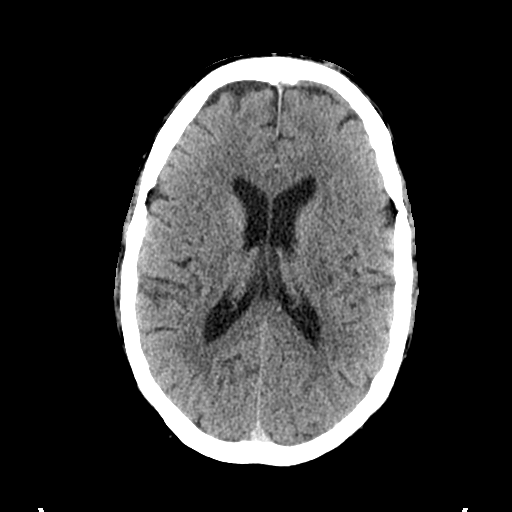
[im 18/35  bone]
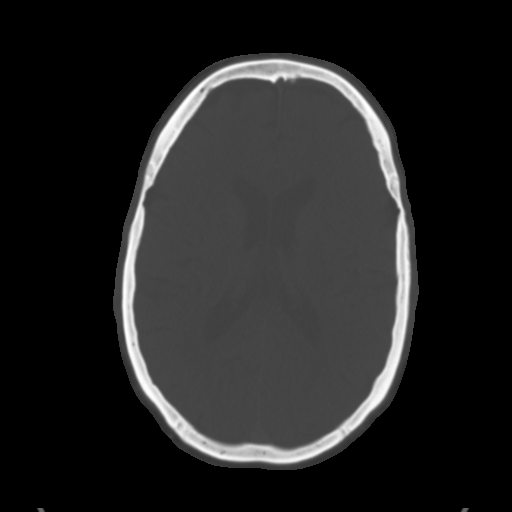
[im 22/35  brain]
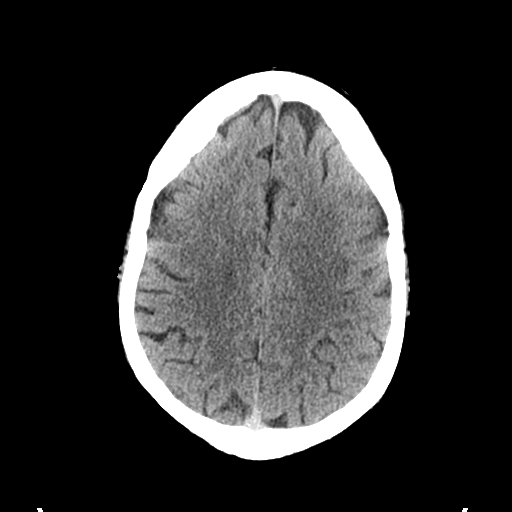
[im 25/35  brain]
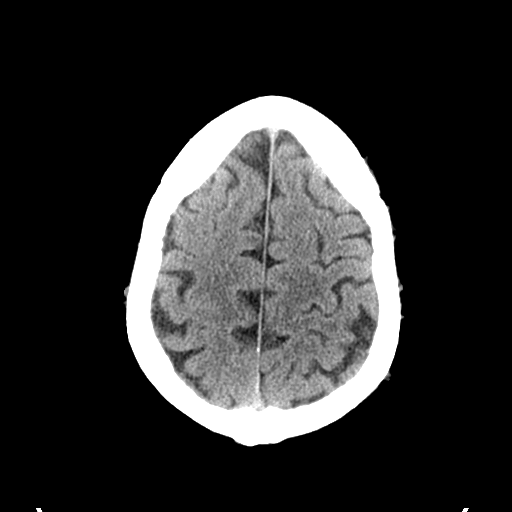
[im 29/35  brain]
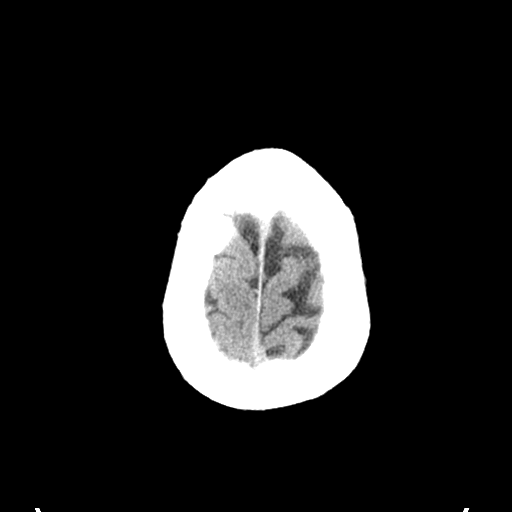
[im 32/35  brain]
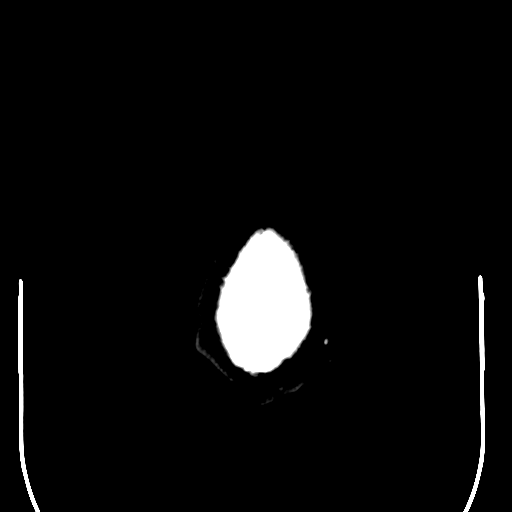
[im 32/35  bone]
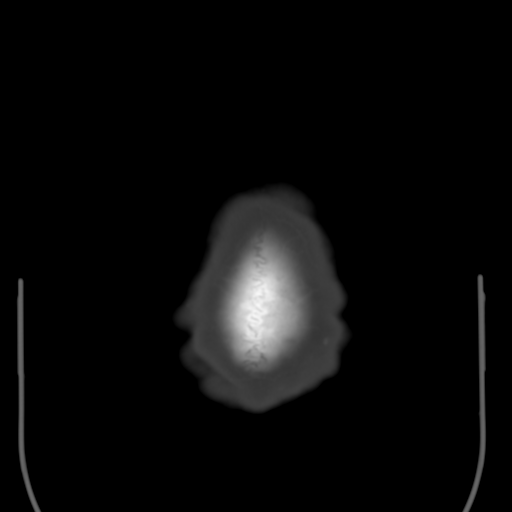

[Series 5: coronal soft tissue · coronal · 0.34mm/px · 3 of 81 slices shown]
[im 27/81  brain]
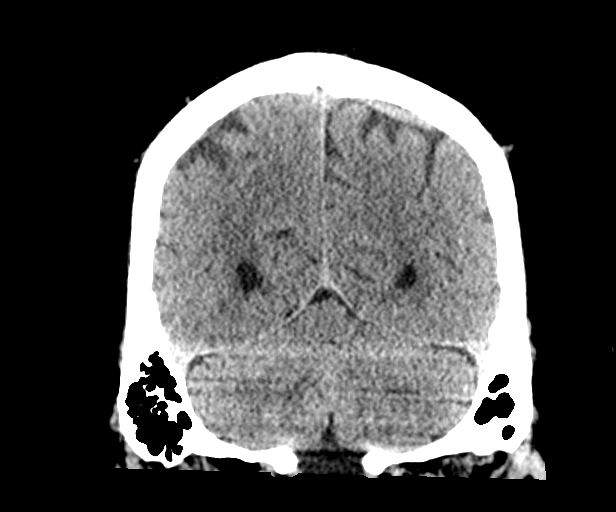
[im 36/81  brain]
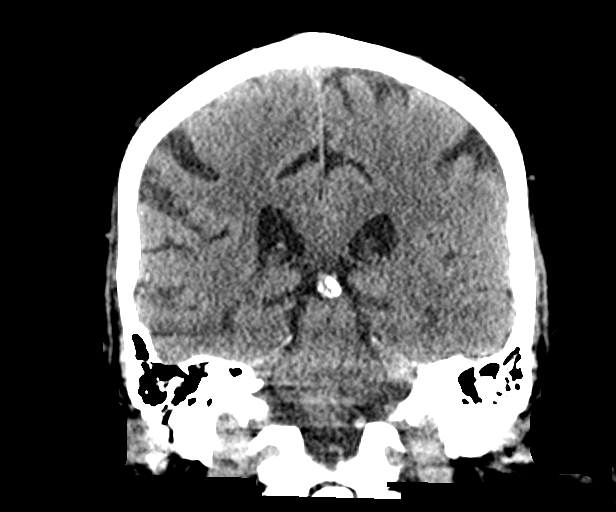
[im 45/81  brain]
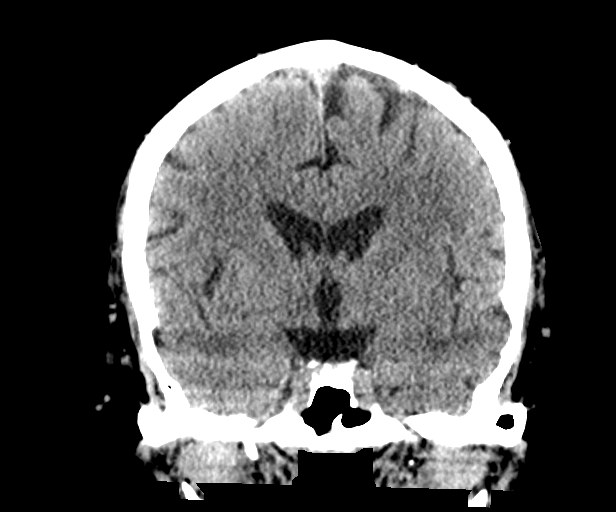

[Series 6: sagittal soft tissue · sagittal · 0.36mm/px · 3 of 61 slices shown]
[im 21/61  brain]
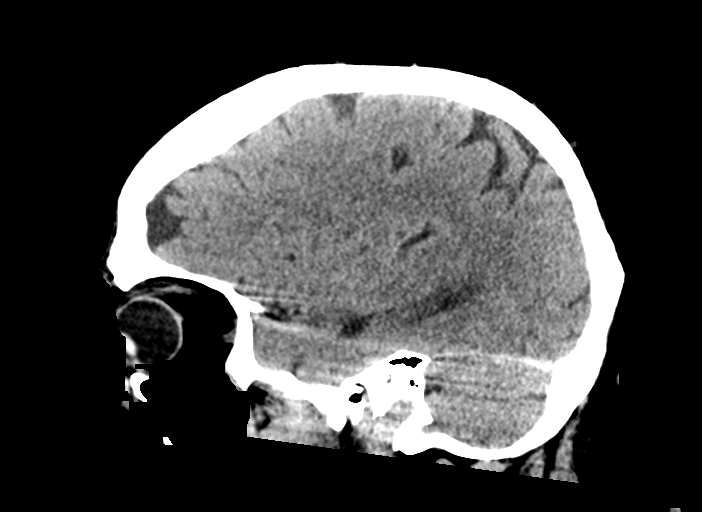
[im 31/61  brain]
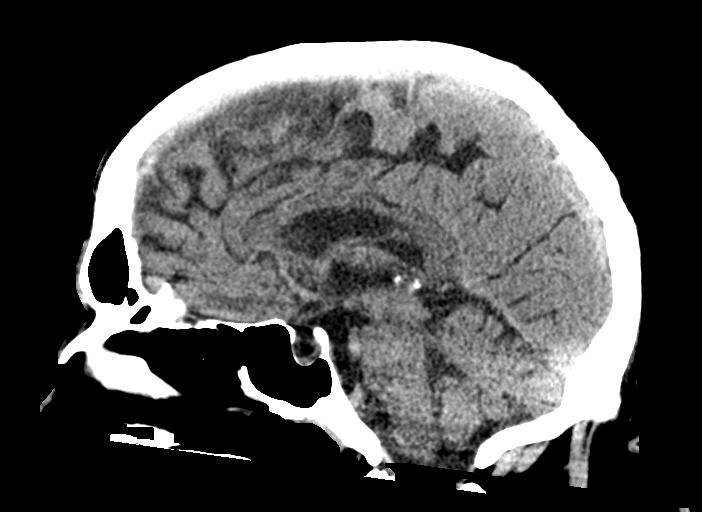
[im 41/61  brain]
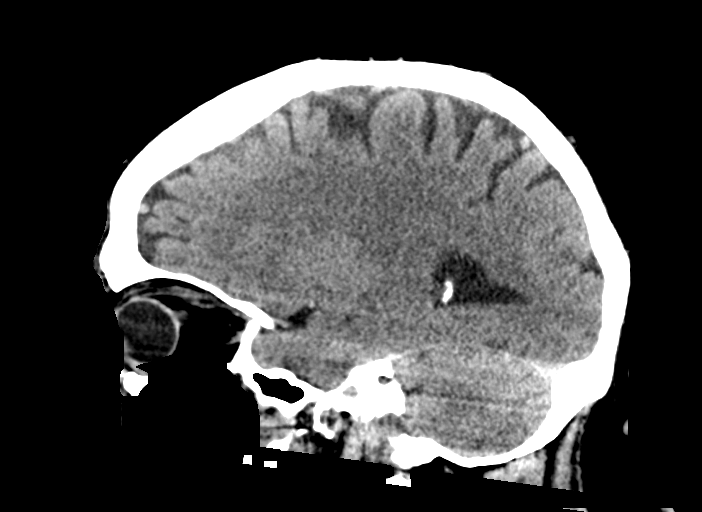

[15 of 47 positions shown; findings below may reference images not displayed]

FINDINGS: Brain: No evidence of acute infarction, hemorrhage, hydrocephalus,
extra-axial collection or mass lesion/mass effect.

Vascular: Negative for hyperdense vessel

Skull: Negative

Sinuses/Orbits: Paranasal sinuses clear.  Negative orbit

Other: None
IMPRESSION: Negative CT head

## 2021-11-14 IMAGING — MR MR HEAD W/O CM
10 series · 48 of 48 positions shown · non-contrast
Comparison: None.

CLINICAL DATA: Transient ischemic attack (TIA)

EXAM:
MRI HEAD WITHOUT CONTRAST
TECHNIQUE: Multiplanar, multiecho pulse sequences of the brain and surrounding
structures were obtained without intravenous contrast.

[Series 5: DWI · axial · 3.0mm · 1.36mm/px · z∈[-29,+111]mm · 12 of 104 slices shown (1 of 4)]
[im 1/104]
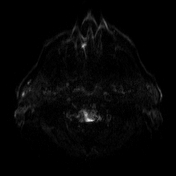
[im 10/104]
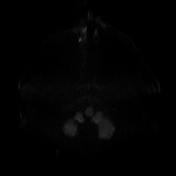
[im 19/104]
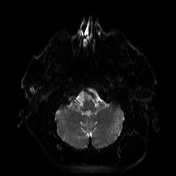
[im 29/104]
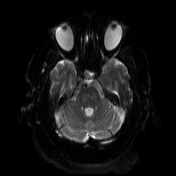
[im 38/104]
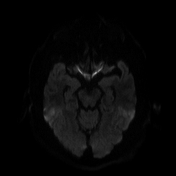
[im 47/104]
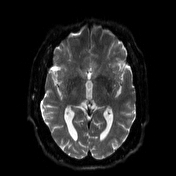
[im 57/104]
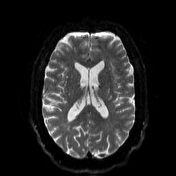
[im 66/104]
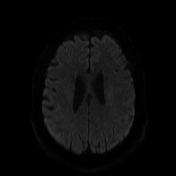
[im 75/104]
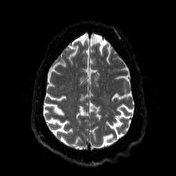
[im 85/104]
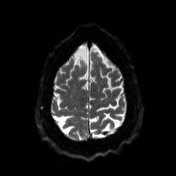
[im 94/104]
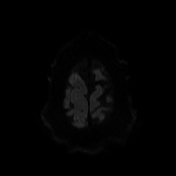
[im 104/104]
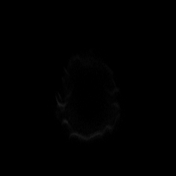

[Series 6: DWI · axial · 3.0mm · 1.36mm/px · z∈[-29,+111]mm · 5 of 52 slices shown (2 of 4)]
[im 1/52]
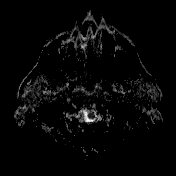
[im 13/52]
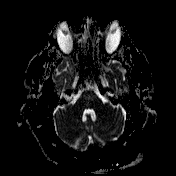
[im 26/52]
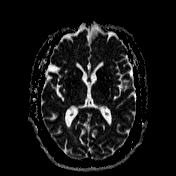
[im 39/52]
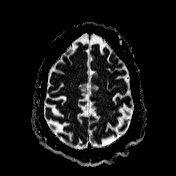
[im 52/52]
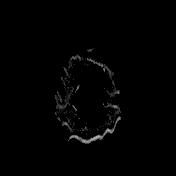

[Series 7: T1 · sagittal · 5.0mm · 0.75mm/px · 2 of 24 slices shown (1 of 2)]
[im 1/24]
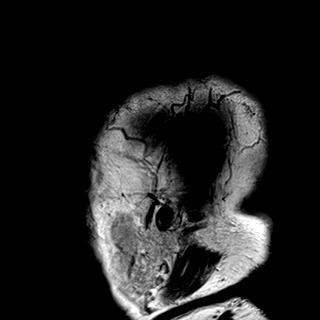
[im 24/24]
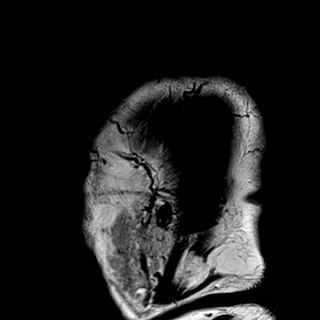

[Series 8: T2 · axial · 5.0mm · 0.65mm/px · z∈[-36,+112]mm · 3 of 26 slices shown (1 of 2)]
[im 1/26]
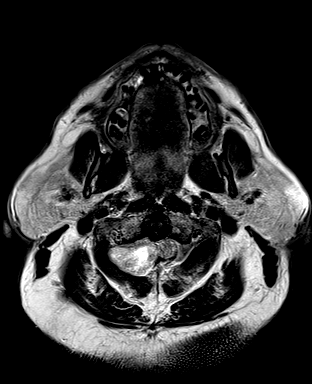
[im 13/26]
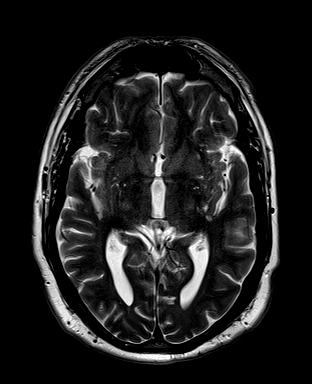
[im 26/26]
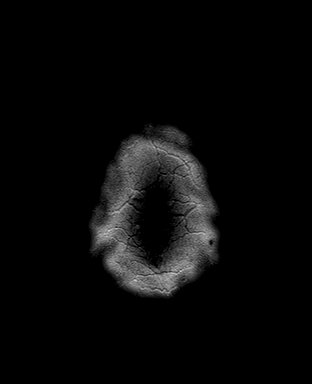

[Series 9: swi_images · axial · 3.0mm · 0.78mm/px · z∈[-37,+113]mm · 6 of 56 slices shown]
[im 1/56]
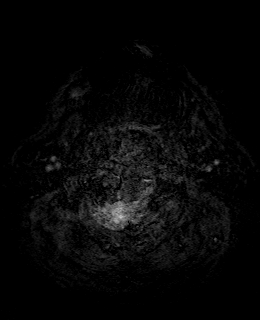
[im 12/56]
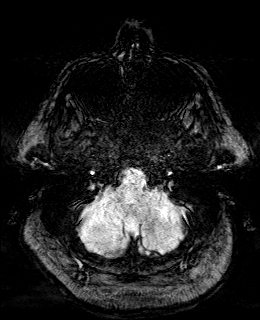
[im 23/56]
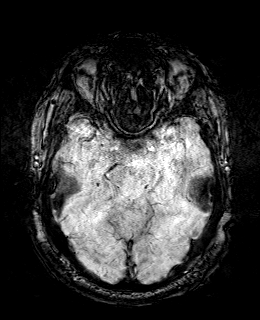
[im 34/56]
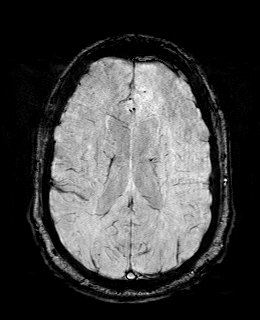
[im 45/56]
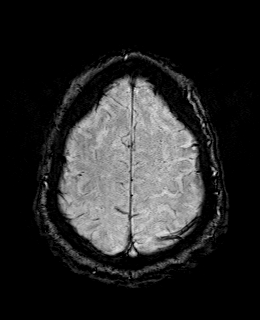
[im 56/56]
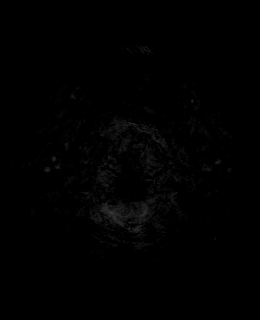

[Series 11: FLAIR · axial · 3.0mm · 0.98mm/px · z∈[-33,+112]mm · 5 of 54 slices shown]
[im 1/54]
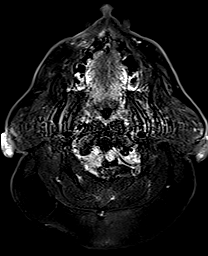
[im 14/54]
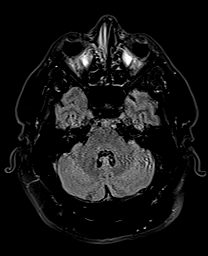
[im 27/54]
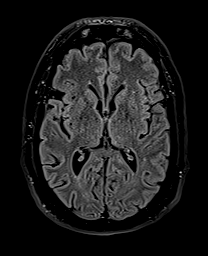
[im 40/54]
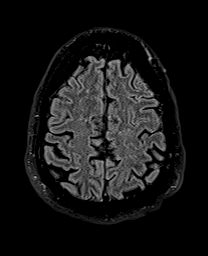
[im 54/54]
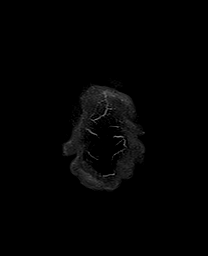

[Series 12: T1 · axial · 3.0mm · 0.49mm/px · z∈[-32,+113]mm · 5 of 54 slices shown (2 of 2)]
[im 1/54]
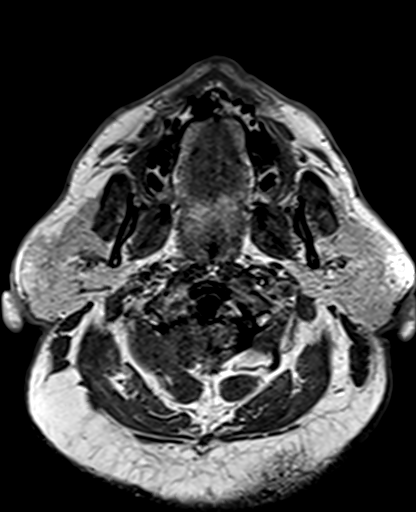
[im 14/54]
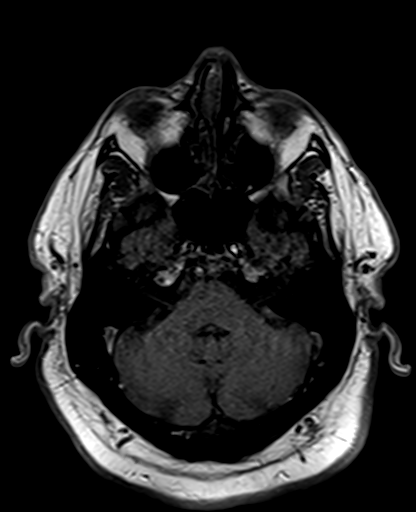
[im 27/54]
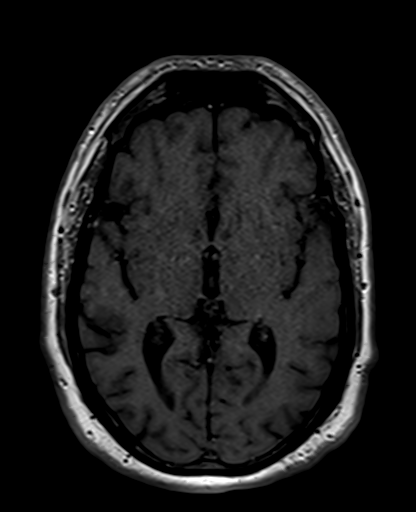
[im 40/54]
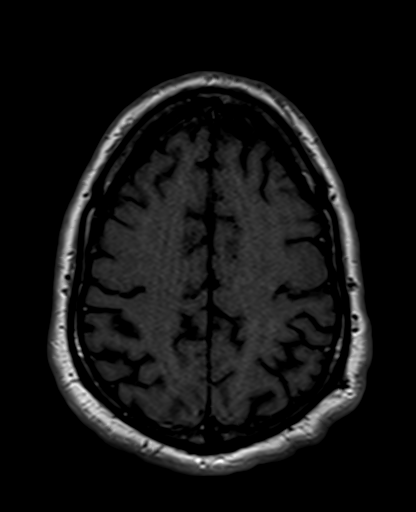
[im 54/54]
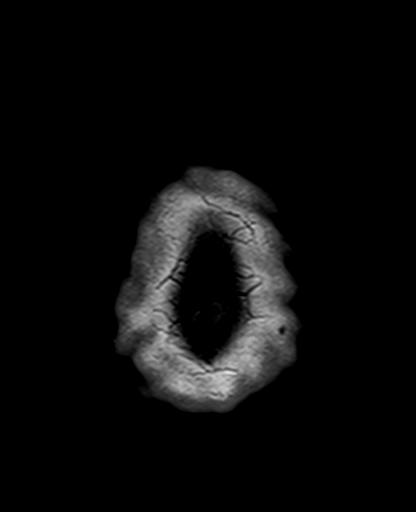

[Series 13: DWI · coronal · 5.0mm · 1.31mm/px · 5 of 52 slices shown (3 of 4)]
[im 1/52]
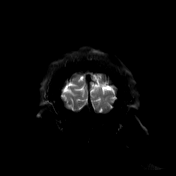
[im 13/52]
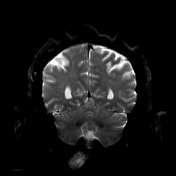
[im 26/52]
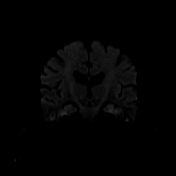
[im 39/52]
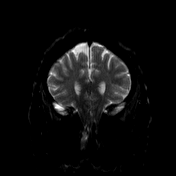
[im 52/52]
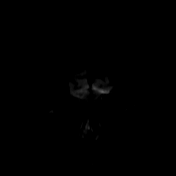

[Series 14: DWI · coronal · 5.0mm · 1.31mm/px · 3 of 26 slices shown (4 of 4)]
[im 1/26]
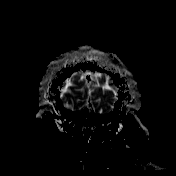
[im 13/26]
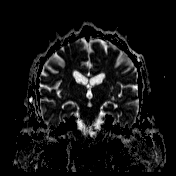
[im 26/26]
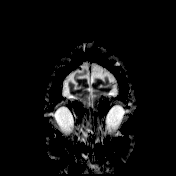

[Series 15: T2 · coronal · 5.0mm · 0.86mm/px · 2 of 24 slices shown (2 of 2)]
[im 1/24]
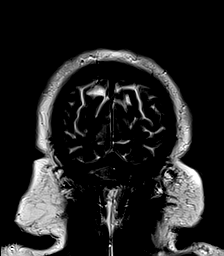
[im 24/24]
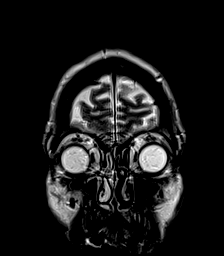

[48 of 48 positions shown; findings below may reference images not displayed]

FINDINGS: Brain: There is no acute infarction or intracranial hemorrhage.
There is no intracranial mass, mass effect, or edema. There is no
hydrocephalus or extra-axial fluid collection. Ventricles and sulci
are within normal limits in size and configuration. Scattered small
foci of T2 hyperintensity in the supratentorial and pontine white
matter are nonspecific but may reflect mild chronic microvascular
ischemic changes.

Vascular: Major vessel flow voids at the skull base are preserved.

Skull and upper cervical spine: Normal marrow signal is preserved.
Partially imaged likely extradural 3 cm lesion at the right aspect
of the spinal canal at the C2 level. There is leftward displacement
of the cord.

Sinuses/Orbits: Paranasal sinuses are aerated. Orbits are
unremarkable.

Other: Sella is unremarkable.  Mastoid air cells are clear.
IMPRESSION: No acute infarction, hemorrhage, or intracranial mass. Mild chronic
microvascular ischemic changes.

Cervical spine MRI with contrast is recommended for evaluation of a
partially imaged likely extradural 3 cm lesion at the right aspect
of the spinal canal at the C2 level. Please including the following
when placing the order: extend axial imaging through the
craniocervical junction.

## 2021-11-14 MED ORDER — GADOBUTROL 1 MMOL/ML IV SOLN
10.0000 mL | Freq: Once | INTRAVENOUS | Status: AC | PRN
  Administered 2021-11-14: 10 mL via INTRAVENOUS

## 2021-11-14 MED ORDER — LORAZEPAM 2 MG/ML IJ SOLN
1.0000 mg | Freq: Once | INTRAMUSCULAR | Status: AC
  Administered 2021-11-14: 1 mg via INTRAVENOUS
  Filled 2021-11-14: qty 1

## 2021-11-14 MED ORDER — LORAZEPAM 1 MG PO TABS
1.0000 mg | ORAL_TABLET | Freq: Once | ORAL | Status: AC
  Administered 2021-11-14: 1 mg via ORAL
  Filled 2021-11-14: qty 1

## 2021-11-14 MED ORDER — DEXAMETHASONE SODIUM PHOSPHATE 10 MG/ML IJ SOLN
10.0000 mg | Freq: Once | INTRAMUSCULAR | Status: AC
Start: 2021-11-14 — End: 2021-11-14
  Administered 2021-11-14: 10 mg via INTRAVENOUS
  Filled 2021-11-14: qty 1

## 2021-11-14 MED ORDER — METHYLPREDNISOLONE 4 MG PO TBPK: ORAL_TABLET | Freq: Every day | ORAL | 0 refills | Status: AC

## 2021-11-14 NOTE — Discharge Instructions (Addendum)
You were seen here today for evaluation of your speech and swallowing problems, your imaging did not show any signs of a stroke.  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 I spoke with Dr. Saintclair Halsted who will see you in his office tomorrow. Please call today/early tomorrow to schedule to see when they can work you in. We are sending you home on a Medrol dose pack as well. Please take as written on the box.  ? ?Contact a health care provider if: ?Your pain medicine is not working. ?Your symptoms get worse. ?The growth seems to be getting larger. ?Get help right away if: ?You notice a growth that is quickly getting larger or that changes in color. ?You have a growth that bleeds or does not heal. ?You develop new weakness or numbness in your face, arm, or leg. ?

## 2021-11-14 NOTE — ED Triage Notes (Signed)
Per pt, states he has been having difficulty speaking and swallowing for the past 5-6 days-states episodes last only a couple of minutes ?

## 2021-11-14 NOTE — ED Notes (Signed)
Pt to MRI

## 2021-11-14 NOTE — ED Provider Triage Note (Signed)
Emergency Medicine Provider Triage Evaluation Note ? ?Adam Moreno , a 66 y.o. male  was evaluated in triage.  Pt complains of trouble saying some words and trouble swallowing for the past 6 days intermittently. He reports the episodes can last anywhere from 30 seconds to 2-3 minutes. Denies any blurry vision or headaches. Denies any unilateral weakness. Denies any chest pain or SOB. ? ?Review of Systems  ?Positive:  ?Negative:  ? ?Physical Exam  ?There were no vitals taken for this visit. ?Gen:   Awake, no distress   ?Resp:  Normal effort, expiratory wheeze R>L  ?MSK:   Moves extremities without difficulty  ?Other:  Cranial nerves II-XII intact. PERRLA. EOMI. Answering questions appropriately with appropriate speech.  ? ?Medical Decision Making  ?Medically screening exam initiated at 10:00 AM.  Appropriate orders placed.  Adam Moreno was informed that the remainder of the evaluation will be completed by another provider, this initial triage assessment does not replace that evaluation, and the importance of remaining in the ED until their evaluation is complete. ? ?Given the time will order CT and MRI. Basic labs ordered. The patient is satting 92% on room air without any increase work of breathing, does not complain of SOB. Denies smoking.  ?  ?Adam Puller, PA-C ?11/14/21 1007 ? ?

## 2021-11-14 NOTE — ED Provider Notes (Signed)
?Adam Moreno ?Provider Note ? ? ?CSN: [id number] ?Arrival date & time: 11/14/21  5956 ? ?  ? ?History ?Chief Complaint  ?Patient presents with  ? Dysphagia  ? Aphasia  ? ? ?Connar Moreno is a 66 y.o. male with h/o HTN and sleep apnea presents to the ED for evaluation of trouble with his speech and swallowing intermittently for the past 6 days. He reports that these episodes can last between 30 seconds -2/3 minutes. He denies any blurry vision, headache, or unilateral weakness. He reports he only has trouble with words such as "education" or "[place]".   ? ?HPI ? ?  ? ?Home Medications ?Prior to Admission medications   ?Medication Sig Start Date End Date Taking? Authorizing Provider  ?amLODipine (NORVASC) 10 MG tablet Take 10 mg by mouth daily. 09/12/20   [provider]  ?Cholecalciferol (DIALYVITE VITAMIN D 5000) 125 MCG (5000 UT) capsule Take 5,000 Units by mouth daily.    [provider]  ?DULoxetine (CYMBALTA) 60 MG capsule Take 60 mg by mouth daily.    [provider]  ?furosemide (LASIX) 20 MG tablet TAKE 1 TABLET BY MOUTH EVERY DAY 09/14/21   Patwardhan, Manish J, MD  ?metoprolol succinate (TOPROL-XL) 25 MG 24 hr tablet Take 25 mg by mouth daily. 09/11/20   [provider]  ?rosuvastatin (CRESTOR) 10 MG tablet Take 1 tablet (10 mg total) by mouth daily. 08/08/21 11/06/21  Patwardhan, Reynold Bowen, MD  ?valsartan-hydrochlorothiazide (DIOVAN-HCT) 320-25 MG tablet Take 1 tablet by mouth daily. 08/04/20   [provider]  ?   ? ?Allergies    ?Patient has no known allergies.   ? ?Review of Systems   ?Review of Systems  ?HENT:  Positive for trouble swallowing.   ?Eyes:  Negative for visual disturbance.  ?Neurological:  Positive for speech difficulty. Negative for weakness and headaches.  ? ?Physical Exam ?Updated Vital Signs ?BP (!) 149/98 (BP Location: Right Arm)   Pulse 88   Temp 98.1 ?F (36.7 ?C) (Oral)   Resp 18   Ht 6' (1.829 m)   Wt  (!) 154.2 kg   SpO2 96%   BMI 46.11 kg/m?  ?Physical Exam ?Vitals and nursing note reviewed.  ?Constitutional:   ?   General: He is not in acute distress. ?   Appearance: Normal appearance. He is obese. He is not toxic-appearing.  ?HENT:  ?   Head: Normocephalic and atraumatic.  ?   Comments: Controlling secretions. ?   Mouth/Throat:  ?   Mouth: Mucous membranes are moist.  ?Eyes:  ?   General: No scleral icterus. ?   Extraocular Movements: Extraocular movements intact.  ?   Pupils: Pupils are equal, round, and reactive to light.  ?Cardiovascular:  ?   Rate and Rhythm: Normal rate and regular rhythm.  ?Pulmonary:  ?   Effort: Pulmonary effort is normal. No respiratory distress.  ?   Breath sounds: Normal breath sounds.  ?Abdominal:  ?   General: Bowel sounds are normal.  ?   Palpations: Abdomen is soft.  ?   Tenderness: There is no abdominal tenderness. There is no guarding or rebound.  ?Musculoskeletal:     ?   General: No deformity.  ?   Cervical back: Normal range of motion.  ?Skin: ?   General: Skin is warm and dry.  ?Neurological:  ?   General: No focal deficit present.  ?   Mental Status: He is alert and oriented to person, place,  and time. Mental status is at baseline.  ?   GCS: GCS eye subscore is 4. GCS verbal subscore is 5. GCS motor subscore is 6.  ?   Cranial Nerves: Cranial nerves 2-12 are intact. No cranial nerve deficit, dysarthria or facial asymmetry.  ?   Sensory: No sensory deficit.  ?   Motor: No weakness.  ?   Coordination: Coordination normal. Finger-Nose-Finger Test normal.  ?   Gait: Gait normal.  ?   Comments: Patient answering questions appropriately with appropriate speech.  Controlling secretions.  Finger-nose intact.  Normal coordination. Cranial nerves II through XII intact.  EOMI.  PERRLA.  Sensation intact throughout.  Normal gait.  GCS of 15.  ? ? ?ED Results / Procedures / Treatments   ?Labs ?(all labs ordered are listed, but only abnormal results are displayed) ?Labs Reviewed - No  data to display ? ?EKG ?None ? ?Radiology ?DG Chest 2 View ? ?Result Date: 11/14/2021 ?CLINICAL DATA:  Wheezing, hypoxia. EXAM: CHEST - 2 VIEW COMPARISON:  None. FINDINGS: The heart size and mediastinal contours are within normal limits. Both lungs are clear. The visualized skeletal structures are unremarkable. IMPRESSION: No active cardiopulmonary disease. Electronically Signed   By: Marijo Conception M.D.   On: 11/14/2021 10:21  ? ?CT Head Wo Contrast ? ?Result Date: 11/14/2021 ?CLINICAL DATA:  TIA EXAM: CT HEAD WITHOUT CONTRAST TECHNIQUE: Contiguous axial images were obtained from the base of the skull through the vertex without intravenous contrast. RADIATION DOSE REDUCTION: This exam was performed according to the departmental dose-optimization program which includes automated exposure control, adjustment of the mA and/or kV according to patient size and/or use of iterative reconstruction technique. COMPARISON:  None. FINDINGS: Brain: No evidence of acute infarction, hemorrhage, hydrocephalus, extra-axial collection or mass lesion/mass effect. Vascular: Negative for hyperdense vessel Skull: Negative Sinuses/Orbits: Paranasal sinuses clear.  Negative orbit Other: None IMPRESSION: Negative CT head Electronically Signed   By: Franchot Gallo M.D.   On: 11/14/2021 11:02  ? ?MR BRAIN WO CONTRAST ? ?Result Date: 11/14/2021 ?CLINICAL DATA:  Transient ischemic attack (TIA) EXAM: MRI HEAD WITHOUT CONTRAST TECHNIQUE: Multiplanar, multiecho pulse sequences of the brain and surrounding structures were obtained without intravenous contrast. COMPARISON:  None. FINDINGS: Brain: There is no acute infarction or intracranial hemorrhage. There is no intracranial mass, mass effect, or edema. There is no hydrocephalus or extra-axial fluid collection. Ventricles and sulci are within normal limits in size and configuration. Scattered small foci of T2 hyperintensity in the supratentorial and pontine white matter are nonspecific but may  reflect mild chronic microvascular ischemic changes. Vascular: Major vessel flow voids at the skull base are preserved. Skull and upper cervical spine: Normal marrow signal is preserved. 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 There is leftward displacement of the cord. Sinuses/Orbits: Paranasal sinuses are aerated. Orbits are unremarkable. Other: Sella is unremarkable.  Mastoid air cells are clear. IMPRESSION: No acute infarction, hemorrhage, or intracranial mass. Mild chronic microvascular ischemic changes. Cervical spine MRI with contrast is recommended for evaluation of a partially imaged likely extradural 3 cm lesion at the right aspect of the spinal canal at the C2 level. Please including the following when placing the order: extend axial imaging through the craniocervical junction. Electronically Signed   By: Macy Mis M.D.   On: 11/14/2021 11:51  ? ?MR Cervical Spine W or Wo Contrast ? ?Result Date: 11/14/2021 ?CLINICAL DATA:  Extradural lesion seen  at C2 on prior MRI EXAM: MRI CERVICAL SPINE WITHOUT AND WITH CONTRAST TECHNIQUE: Multiplanar and multiecho pulse sequences of the cervical spine, to include the craniocervical junction and cervicothoracic junction, were obtained without and with intravenous contrast. CONTRAST:  13m GADAVIST GADOBUTROL 1 MMOL/ML IV SOLN COMPARISON:  Same-day brain MRI FINDINGS: Alignment: Normal. Vertebrae: Vertebral body heights are preserved. Background marrow signal is normal. There is no abnormal marrow enhancement or edema. Cord/canal: There is a 4.3 cm x 2.7 cm by 2.6 cm peripherally enhancing, centrally nonenhancing centrally T2 hyperintense solid lesion centered posterior to the right C1-C2 articulation with extension into the right epidural space and resulting leftward displacement of the cord. There is mild cord compression but no cord signal abnormality. There remains CSF space around the cord at the  site of mass effect (8-25). The remainder of the cord is normal in signal and morphology. Posterior Fossa, vertebral arteries, paraspinal tissues: The posterior fossa is assessed on the brain MRI obtained earlier

## 2021-11-15 DIAGNOSIS — Z6841 Body Mass Index (BMI) 40.0 and over, adult: Secondary | ICD-10-CM | POA: Diagnosis not present

## 2021-11-15 DIAGNOSIS — D334 Benign neoplasm of spinal cord: Secondary | ICD-10-CM | POA: Diagnosis not present

## 2021-12-03 DIAGNOSIS — D333 Benign neoplasm of cranial nerves: Secondary | ICD-10-CM | POA: Diagnosis not present

## 2021-12-03 DIAGNOSIS — R490 Dysphonia: Secondary | ICD-10-CM | POA: Diagnosis not present

## 2021-12-04 ENCOUNTER — Other Ambulatory Visit: Payer: Self-pay | Admitting: Neurological Surgery

## 2021-12-06 NOTE — Pre-Procedure Instructions (Signed)
Surgical Instructions ? ? ? Your procedure is scheduled on Tuesday, Dec 11, 2021 at 11:42 AM. ? Report to [facility] Main Entrance "A" at 9:45 A.M., then check in with the Admitting office. ? Call this number if you have problems the morning of surgery: ? [phone] ? ? If you have any questions prior to your surgery date call [phone]: Open Monday-Friday 8am-4pm ? ? ? Remember: ? Do not eat after midnight the night before your surgery ? ?You may drink clear liquids until 8:45 AM the morning of your surgery.   ?Clear liquids allowed are: Water, Non-Citrus Juices (without pulp), Carbonated Beverages, Clear Tea, Black Coffee Only (NO MILK, CREAM OR POWDERED CREAMER of any kind), and Gatorade. ?  ? Take these medicines the morning of surgery with A SIP OF WATER: ? ?amLODipine (NORVASC) ?DULoxetine (CYMBALTA) ?metoprolol succinate (TOPROL-XL) ?rosuvastatin (CRESTOR) ? ? ?As of today, STOP taking any Aspirin (unless otherwise instructed by your surgeon) Aleve, Naproxen, Ibuprofen, Motrin, Advil, Goody's, BC's, all herbal medications, fish oil, and all vitamins. ?         ?           ?Do NOT Smoke (Tobacco/Vaping) for 24 hours prior to your procedure. ? ?If you use a CPAP at night, you may bring your mask/headgear for your overnight stay. ?  ?Contacts, glasses, piercing's, hearing aid's, dentures or partials may not be worn into surgery, please bring cases for these belongings.  ?  ?For patients admitted to the hospital, discharge time will be determined by your treatment team. ?  ?Patients discharged the day of surgery will not be allowed to drive home, and someone needs to stay with them for 24 hours. ? ?SURGICAL WAITING ROOM VISITATION ?Patients having surgery or a procedure may have two support people in the waiting area. ?Visitors may stay in the waiting area during the procedure and switch out with other visitors if needed. ?Children under the age of 22 must have an adult accompany them who is not the  patient. ?If the patient needs to stay at the hospital during part of their recovery, the visitor guidelines for inpatient rooms apply. ? ?Please refer to the [facility] website for the visitor guidelines for Inpatients (after your surgery is over and you are in a regular room).  ? ? ?Special instructions:   ?[facility]- Preparing For Surgery ? ?Before surgery, you can play an important role. Because skin is not sterile, your skin needs to be as free of germs as possible. You can reduce the number of germs on your skin by washing with CHG (chlorahexidine gluconate) Soap before surgery.  CHG is an antiseptic cleaner which kills germs and bonds with the skin to continue killing germs even after washing.   ? ?Oral Hygiene is also important to reduce your risk of infection.  Remember - BRUSH YOUR TEETH THE MORNING OF SURGERY WITH YOUR REGULAR TOOTHPASTE ? ?Please do not use if you have an allergy to CHG or antibacterial soaps. If your skin becomes reddened/irritated stop using the CHG.  ?Do not shave (including legs and underarms) for at least 48 hours prior to first CHG shower. It is OK to shave your face. ? ?Please follow these instructions carefully. ?  ?Shower the NIGHT BEFORE SURGERY and the MORNING OF SURGERY ? ?If you chose to wash your hair, wash your hair first as usual with your normal shampoo. ? ?After you shampoo, rinse your hair and body thoroughly to remove the shampoo. ? ?Use  CHG Soap as you would any other liquid soap. You can apply CHG directly to the skin and wash gently with a scrungie or a clean washcloth.  ? ?Apply the CHG Soap to your body ONLY FROM THE NECK DOWN.  Do not use on open wounds or open sores. Avoid contact with your eyes, ears, mouth and genitals (private parts). Wash Face and genitals (private parts)  with your normal soap.  ? ?Wash thoroughly, paying special attention to the area where your surgery will be performed. ? ?Thoroughly rinse your body with warm water from the neck  down. ? ?DO NOT shower/wash with your normal soap after using and rinsing off the CHG Soap. ? ?Pat yourself dry with a CLEAN TOWEL. ? ?Wear CLEAN PAJAMAS to bed the night before surgery ? ?Place CLEAN SHEETS on your bed the night before your surgery ? ?DO NOT SLEEP WITH PETS. ? ? ?Day of Surgery: ?Take a shower with CHG soap. ?Do not wear jewelry. ?Do not wear lotions, powders, colognes, or deodorant. ?Do not shave 48 hours prior to surgery.  Men may shave face and neck. ?Do not bring valuables to the hospital.  ?[facility] is not responsible for any belongings or valuables. ?Wear Clean/Comfortable clothing the morning of surgery ?Do not apply any deodorants/lotions.   ?Remember to brush your teeth WITH YOUR REGULAR TOOTHPASTE. ?  ?Please read over the following fact sheets that you were given. ? ?If you received a COVID test during your pre-op visit  it is requested that you wear a mask when out in public, stay away from anyone that may not be feeling well and notify your surgeon if you develop symptoms. If you have been in contact with anyone that has tested positive in the last 10 days please notify you surgeon. ?  ? ?

## 2021-12-07 ENCOUNTER — Encounter (HOSPITAL_COMMUNITY): Payer: Self-pay

## 2021-12-07 ENCOUNTER — Encounter (HOSPITAL_COMMUNITY)
Admission: RE | Admit: 2021-12-07 | Discharge: 2021-12-07 | Disposition: A | Discharge status: Home / Self Care | Payer: BC Managed Care – PPO | Source: Ambulatory Visit | Attending: Neurological Surgery | Admitting: Neurological Surgery

## 2021-12-07 ENCOUNTER — Other Ambulatory Visit: Payer: Self-pay

## 2021-12-07 VITALS — BP 103/69 | HR 93 | Temp 98.2°F | Resp 18 | Ht 72.0 in | Wt 325.2 lb

## 2021-12-07 DIAGNOSIS — Z01818 Encounter for other preprocedural examination: Secondary | ICD-10-CM

## 2021-12-07 DIAGNOSIS — M47812 Spondylosis without myelopathy or radiculopathy, cervical region: Secondary | ICD-10-CM | POA: Insufficient documentation

## 2021-12-07 DIAGNOSIS — Z01812 Encounter for preprocedural laboratory examination: Secondary | ICD-10-CM | POA: Diagnosis not present

## 2021-12-07 HISTORY — DX: Depression, unspecified: F32.A

## 2021-12-07 LAB — BASIC METABOLIC PANEL
Anion gap: 15 (ref 5–15)
BUN: 41 mg/dL — ABNORMAL HIGH (ref 8–23)
CO2: 25 mmol/L (ref 22–32)
Calcium: 9.4 mg/dL (ref 8.9–10.3)
Chloride: 98 mmol/L (ref 98–111)
Creatinine, Ser: 1.9 mg/dL — ABNORMAL HIGH (ref 0.61–1.24)
GFR, Estimated: 39 mL/min — ABNORMAL LOW (ref >60)
Glucose, Bld: 114 mg/dL — ABNORMAL HIGH (ref 70–99)
Potassium: 3.7 mmol/L (ref 3.5–5.1)
Sodium: 138 mmol/L (ref 135–145)

## 2021-12-07 LAB — CBC
HCT: 51.1 % (ref 39.0–52.0)
Hemoglobin: 17.8 g/dL — ABNORMAL HIGH (ref 13.0–17.0)
MCH: 29.8 pg (ref 26.0–34.0)
MCHC: 34.8 g/dL (ref 30.0–36.0)
MCV: 85.6 fL (ref 80.0–100.0)
Platelets: 379 10*3/uL (ref 150–400)
RBC: 5.97 MIL/uL — ABNORMAL HIGH (ref 4.22–5.81)
RDW: 12.9 % (ref 11.5–15.5)
WBC: 12.9 10*3/uL — ABNORMAL HIGH (ref 4.0–10.5)
nRBC: 0 % (ref 0.0–0.2)

## 2021-12-07 LAB — SURGICAL PCR SCREEN
MRSA, PCR: NEGATIVE
Staphylococcus aureus: NEGATIVE

## 2021-12-07 LAB — TYPE AND SCREEN
ABO/RH(D): O POS
Antibody Screen: NEGATIVE

## 2021-12-07 NOTE — Progress Notes (Signed)
PCP -  ?Cardiologist - Vernell Leep MD ? ?PPM/ICD - denies ?Device Orders -  ?Rep Notified -  ? ?Chest x-ray - 11/14/21 ?EKG - 11/15/21 ?Stress Test -  ?ECHO -  ?Cardiac Cath -  ? ?Sleep Study - denies;apnea score =6.Stop BANG form faxed to PCP ?CPAP -none  ? ?Fasting Blood Sugar - na ?Checks Blood Sugar _____ times a day ? ?Blood Thinner Instructions:na ?Aspirin Instructions:na ? ?ERAS Protcol -clear liquids until 0845 ?PRE-SURGERY Ensure or G2- no ? ?COVID TEST- na ? ? ?Anesthesia review: yes;cardiac history ? ?Patient denies shortness of breath, fever, cough and chest pain at PAT appointment ? ? ?All instructions explained to the patient, with a verbal understanding of the material. Patient agrees to go over the instructions while at home for a better understanding. Patient also instructed to wear a mask when out in public prior to surgery. The opportunity to ask questions was provided. ?  ?

## 2021-12-07 NOTE — Progress Notes (Signed)
Secure voicemail left for Jessica in Dr. Colleen Can office and [employer] sent to Dr.Ostergard informing them of pt's creatinine 1.9 on BMP drawn today and WBC's 12.9 on CBC. ?

## 2021-12-10 ENCOUNTER — Other Ambulatory Visit: Payer: Self-pay | Admitting: Radiation Therapy

## 2021-12-10 NOTE — Progress Notes (Signed)
Anesthesia Chart Review: ? ?Pt preop labs notable for creatinine 1.90, up from baseline ~ 0.95. Dr. Zada Finders notified and advised pt dehydrated due to dysphagia secondary to tumor and needs to proceed with surgery.  ? ?I did reach out to pt's PCP Dr. Lindell Noe as well to notify of elevated creatinine and advise Dr. Colleen Can recommendation.  ? ?Remainder of preop labs unremarkable.  ? ?Reviewed with anesthesiologist Dr. Royce Macadamia.  He recommended repeat chemistry on day of surgery. ? ?EKG 11/15/21: Sinus rhythm. Rate 85. Low voltage, precordial leads. ? ?MR Cervical spine 11/14/21: ?IMPRESSION: ?1. Peripherally enhancing lesion measuring 4.3 cm x 2.7 cm x 2.6 cm ?centered posterior to the right C1-C2 articulation with extension ?into the right epidural space with resulting mass effect and ?displacement of the cord but no cord signal abnormality. 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 ?2. Multilevel degenerative changes as above, most advanced at C6-C7 ?where there is mild-to-moderate spinal canal stenosis and moderate ?left worse than right neural foraminal stenosis. ? ? ? ?Karoline Caldwell, PA-C ?[facility] Short Stay Center/Anesthesiology ?Phone [phone] ?12/10/2021 3:55 PM ? ? ?

## 2021-12-10 NOTE — Anesthesia Preprocedure Evaluation (Addendum)
Anesthesia Evaluation  ?Patient identified by MRN, date of birth, ID band ?Patient awake ? ? ? ?Reviewed: ?Allergy & Precautions, NPO status , Patient's Chart, lab work & pertinent test results ? ?Airway ?Mallampati: II ? ?TM Distance: >3 FB ? ? ? ? Dental ?  ?Pulmonary ?neg pulmonary ROS, sleep apnea ,  ?  ?breath sounds clear to auscultation ? ? ? ? ? ? Cardiovascular ?hypertension, + DOE  ? ?Rhythm:Regular Rate:Normal ? ?History noted ?Dr. Nyoka Cowden  ?  ?Neuro/Psych ?  ? GI/Hepatic ?negative GI ROS, Neg liver ROS,   ?Endo/Other  ?negative endocrine ROS ? Renal/GU ?negative Renal ROS  ? ?  ?Musculoskeletal ? ? Abdominal ?  ?Peds ? Hematology ?  ?Anesthesia Other Findings ? ? Reproductive/Obstetrics ? ?  ? ? ? ? ? ? ? ? ? ? ? ? ? ?  ?  ? ? ? ? ? ? ? ?Anesthesia Physical ?Anesthesia Plan ? ?ASA: 3 ? ?Anesthesia Plan: General  ? ?Post-op Pain Management:   ? ?Induction: Intravenous ? ?PONV Risk Score and Plan: 3 and Ondansetron, Dexamethasone and Midazolam ? ?Airway Management Planned: Oral ETT ? ?Additional Equipment:  ? ?Intra-op Plan:  ? ?Post-operative Plan: Extubation in OR ? ?Informed Consent: I have reviewed the patients History and Physical, chart, labs and discussed the procedure including the risks, benefits and alternatives for the proposed anesthesia with the patient or authorized representative who has indicated his/her understanding and acceptance.  ? ? ? ?Dental advisory given ? ?Plan Discussed with: CRNA and Anesthesiologist ? ?Anesthesia Plan Comments: (See PAT note by Karoline Caldwell, PA-C ?)  ? ? ? ? ? ?Anesthesia Quick Evaluation ? ?

## 2021-12-11 ENCOUNTER — Other Ambulatory Visit: Payer: Self-pay

## 2021-12-11 ENCOUNTER — Inpatient Hospital Stay (HOSPITAL_COMMUNITY): Payer: BC Managed Care – PPO | Admitting: Anesthesiology

## 2021-12-11 ENCOUNTER — Inpatient Hospital Stay (HOSPITAL_COMMUNITY): Payer: BC Managed Care – PPO

## 2021-12-11 ENCOUNTER — Inpatient Hospital Stay (HOSPITAL_COMMUNITY): Payer: BC Managed Care – PPO | Admitting: Vascular Surgery

## 2021-12-11 ENCOUNTER — Encounter (HOSPITAL_COMMUNITY): Admission: RE | Disposition: E | Payer: Self-pay | Source: Home / Self Care | Attending: Neurological Surgery

## 2021-12-11 ENCOUNTER — Encounter (HOSPITAL_COMMUNITY): Payer: Self-pay | Admitting: Neurological Surgery

## 2021-12-11 ENCOUNTER — Inpatient Hospital Stay (HOSPITAL_COMMUNITY)
Admission: RE | Admit: 2021-12-11 | Discharge: 2022-01-03 | DRG: 003 | Disposition: E | Payer: BC Managed Care – PPO | Attending: Critical Care Medicine | Admitting: Critical Care Medicine

## 2021-12-11 DIAGNOSIS — J189 Pneumonia, unspecified organism: Secondary | ICD-10-CM | POA: Diagnosis not present

## 2021-12-11 DIAGNOSIS — Z66 Do not resuscitate: Secondary | ICD-10-CM | POA: Diagnosis not present

## 2021-12-11 DIAGNOSIS — Z6841 Body Mass Index (BMI) 40.0 and over, adult: Secondary | ICD-10-CM | POA: Diagnosis not present

## 2021-12-11 DIAGNOSIS — R0902 Hypoxemia: Secondary | ICD-10-CM | POA: Diagnosis not present

## 2021-12-11 DIAGNOSIS — K6389 Other specified diseases of intestine: Secondary | ICD-10-CM | POA: Diagnosis not present

## 2021-12-11 DIAGNOSIS — D649 Anemia, unspecified: Secondary | ICD-10-CM

## 2021-12-11 DIAGNOSIS — I959 Hypotension, unspecified: Secondary | ICD-10-CM | POA: Diagnosis not present

## 2021-12-11 DIAGNOSIS — R578 Other shock: Secondary | ICD-10-CM | POA: Diagnosis not present

## 2021-12-11 DIAGNOSIS — N170 Acute kidney failure with tubular necrosis: Secondary | ICD-10-CM | POA: Diagnosis not present

## 2021-12-11 DIAGNOSIS — R6521 Severe sepsis with septic shock: Secondary | ICD-10-CM | POA: Diagnosis not present

## 2021-12-11 DIAGNOSIS — I130 Hypertensive heart and chronic kidney disease with heart failure and stage 1 through stage 4 chronic kidney disease, or unspecified chronic kidney disease: Secondary | ICD-10-CM | POA: Diagnosis present

## 2021-12-11 DIAGNOSIS — I517 Cardiomegaly: Secondary | ICD-10-CM | POA: Diagnosis not present

## 2021-12-11 DIAGNOSIS — D6489 Other specified anemias: Secondary | ICD-10-CM | POA: Diagnosis not present

## 2021-12-11 DIAGNOSIS — Z781 Physical restraint status: Secondary | ICD-10-CM

## 2021-12-11 DIAGNOSIS — R471 Dysarthria and anarthria: Secondary | ICD-10-CM | POA: Diagnosis present

## 2021-12-11 DIAGNOSIS — Z930 Tracheostomy status: Secondary | ICD-10-CM | POA: Diagnosis not present

## 2021-12-11 DIAGNOSIS — N179 Acute kidney failure, unspecified: Secondary | ICD-10-CM | POA: Diagnosis not present

## 2021-12-11 DIAGNOSIS — J9589 Other postprocedural complications and disorders of respiratory system, not elsewhere classified: Secondary | ICD-10-CM | POA: Diagnosis not present

## 2021-12-11 DIAGNOSIS — R1310 Dysphagia, unspecified: Secondary | ICD-10-CM | POA: Diagnosis present

## 2021-12-11 DIAGNOSIS — J690 Pneumonitis due to inhalation of food and vomit: Secondary | ICD-10-CM | POA: Diagnosis not present

## 2021-12-11 DIAGNOSIS — Z95810 Presence of automatic (implantable) cardiac defibrillator: Secondary | ICD-10-CM | POA: Diagnosis not present

## 2021-12-11 DIAGNOSIS — I2699 Other pulmonary embolism without acute cor pulmonale: Secondary | ICD-10-CM | POA: Diagnosis not present

## 2021-12-11 DIAGNOSIS — A419 Sepsis, unspecified organism: Secondary | ICD-10-CM | POA: Diagnosis not present

## 2021-12-11 DIAGNOSIS — F410 Panic disorder [episodic paroxysmal anxiety] without agoraphobia: Secondary | ICD-10-CM | POA: Diagnosis present

## 2021-12-11 DIAGNOSIS — I10 Essential (primary) hypertension: Secondary | ICD-10-CM | POA: Diagnosis not present

## 2021-12-11 DIAGNOSIS — I5033 Acute on chronic diastolic (congestive) heart failure: Secondary | ICD-10-CM | POA: Diagnosis not present

## 2021-12-11 DIAGNOSIS — I469 Cardiac arrest, cause unspecified: Secondary | ICD-10-CM | POA: Diagnosis not present

## 2021-12-11 DIAGNOSIS — I4891 Unspecified atrial fibrillation: Secondary | ICD-10-CM | POA: Diagnosis not present

## 2021-12-11 DIAGNOSIS — D334 Benign neoplasm of spinal cord: Principal | ICD-10-CM | POA: Diagnosis present

## 2021-12-11 DIAGNOSIS — R34 Anuria and oliguria: Secondary | ICD-10-CM | POA: Diagnosis not present

## 2021-12-11 DIAGNOSIS — E8729 Other acidosis: Secondary | ICD-10-CM

## 2021-12-11 DIAGNOSIS — J9601 Acute respiratory failure with hypoxia: Secondary | ICD-10-CM | POA: Diagnosis not present

## 2021-12-11 DIAGNOSIS — R008 Other abnormalities of heart beat: Secondary | ICD-10-CM | POA: Diagnosis not present

## 2021-12-11 DIAGNOSIS — I5031 Acute diastolic (congestive) heart failure: Secondary | ICD-10-CM | POA: Diagnosis not present

## 2021-12-11 DIAGNOSIS — E875 Hyperkalemia: Secondary | ICD-10-CM | POA: Diagnosis not present

## 2021-12-11 DIAGNOSIS — N1831 Chronic kidney disease, stage 3a: Secondary | ICD-10-CM | POA: Diagnosis not present

## 2021-12-11 DIAGNOSIS — G9341 Metabolic encephalopathy: Secondary | ICD-10-CM | POA: Diagnosis not present

## 2021-12-11 DIAGNOSIS — J9811 Atelectasis: Secondary | ICD-10-CM | POA: Diagnosis not present

## 2021-12-11 DIAGNOSIS — E8809 Other disorders of plasma-protein metabolism, not elsewhere classified: Secondary | ICD-10-CM | POA: Diagnosis present

## 2021-12-11 DIAGNOSIS — J95851 Ventilator associated pneumonia: Secondary | ICD-10-CM | POA: Diagnosis not present

## 2021-12-11 DIAGNOSIS — E872 Acidosis, unspecified: Secondary | ICD-10-CM | POA: Diagnosis not present

## 2021-12-11 DIAGNOSIS — F05 Delirium due to known physiological condition: Secondary | ICD-10-CM | POA: Diagnosis not present

## 2021-12-11 DIAGNOSIS — D497 Neoplasm of unspecified behavior of endocrine glands and other parts of nervous system: Secondary | ICD-10-CM | POA: Insufficient documentation

## 2021-12-11 DIAGNOSIS — Z9911 Dependence on respirator [ventilator] status: Secondary | ICD-10-CM | POA: Diagnosis not present

## 2021-12-11 DIAGNOSIS — R339 Retention of urine, unspecified: Secondary | ICD-10-CM | POA: Diagnosis not present

## 2021-12-11 DIAGNOSIS — E876 Hypokalemia: Secondary | ICD-10-CM | POA: Diagnosis not present

## 2021-12-11 DIAGNOSIS — R918 Other nonspecific abnormal finding of lung field: Secondary | ICD-10-CM | POA: Diagnosis not present

## 2021-12-11 DIAGNOSIS — F419 Anxiety disorder, unspecified: Secondary | ICD-10-CM | POA: Diagnosis present

## 2021-12-11 DIAGNOSIS — T4145XA Adverse effect of unspecified anesthetic, initial encounter: Secondary | ICD-10-CM | POA: Diagnosis not present

## 2021-12-11 DIAGNOSIS — M47812 Spondylosis without myelopathy or radiculopathy, cervical region: Secondary | ICD-10-CM | POA: Diagnosis not present

## 2021-12-11 DIAGNOSIS — R7989 Other specified abnormal findings of blood chemistry: Principal | ICD-10-CM

## 2021-12-11 DIAGNOSIS — E782 Mixed hyperlipidemia: Secondary | ICD-10-CM | POA: Diagnosis present

## 2021-12-11 DIAGNOSIS — Y92239 Unspecified place in hospital as the place of occurrence of the external cause: Secondary | ICD-10-CM | POA: Diagnosis not present

## 2021-12-11 DIAGNOSIS — T462X5A Adverse effect of other antidysrhythmic drugs, initial encounter: Secondary | ICD-10-CM | POA: Diagnosis not present

## 2021-12-11 DIAGNOSIS — D3611 Benign neoplasm of peripheral nerves and autonomic nervous system of face, head, and neck: Secondary | ICD-10-CM | POA: Diagnosis not present

## 2021-12-11 DIAGNOSIS — Z538 Procedure and treatment not carried out for other reasons: Secondary | ICD-10-CM | POA: Diagnosis not present

## 2021-12-11 DIAGNOSIS — G4733 Obstructive sleep apnea (adult) (pediatric): Secondary | ICD-10-CM | POA: Diagnosis not present

## 2021-12-11 DIAGNOSIS — N281 Cyst of kidney, acquired: Secondary | ICD-10-CM | POA: Diagnosis not present

## 2021-12-11 DIAGNOSIS — F32A Depression, unspecified: Secondary | ICD-10-CM | POA: Diagnosis not present

## 2021-12-11 DIAGNOSIS — G4730 Sleep apnea, unspecified: Secondary | ICD-10-CM

## 2021-12-11 DIAGNOSIS — I5030 Unspecified diastolic (congestive) heart failure: Secondary | ICD-10-CM

## 2021-12-11 DIAGNOSIS — J9600 Acute respiratory failure, unspecified whether with hypoxia or hypercapnia: Secondary | ICD-10-CM

## 2021-12-11 DIAGNOSIS — D492 Neoplasm of unspecified behavior of bone, soft tissue, and skin: Secondary | ICD-10-CM | POA: Diagnosis not present

## 2021-12-11 DIAGNOSIS — R739 Hyperglycemia, unspecified: Secondary | ICD-10-CM | POA: Diagnosis not present

## 2021-12-11 DIAGNOSIS — Z91199 Patient's noncompliance with other medical treatment and regimen due to unspecified reason: Secondary | ICD-10-CM

## 2021-12-11 DIAGNOSIS — R0602 Shortness of breath: Secondary | ICD-10-CM | POA: Diagnosis not present

## 2021-12-11 DIAGNOSIS — I4892 Unspecified atrial flutter: Secondary | ICD-10-CM | POA: Diagnosis not present

## 2021-12-11 DIAGNOSIS — M4802 Spinal stenosis, cervical region: Secondary | ICD-10-CM | POA: Diagnosis not present

## 2021-12-11 DIAGNOSIS — K9189 Other postprocedural complications and disorders of digestive system: Secondary | ICD-10-CM | POA: Diagnosis not present

## 2021-12-11 DIAGNOSIS — Z981 Arthrodesis status: Secondary | ICD-10-CM | POA: Diagnosis not present

## 2021-12-11 DIAGNOSIS — I952 Hypotension due to drugs: Secondary | ICD-10-CM | POA: Diagnosis not present

## 2021-12-11 DIAGNOSIS — R7401 Elevation of levels of liver transaminase levels: Secondary | ICD-10-CM | POA: Diagnosis not present

## 2021-12-11 DIAGNOSIS — J90 Pleural effusion, not elsewhere classified: Secondary | ICD-10-CM | POA: Diagnosis not present

## 2021-12-11 DIAGNOSIS — I480 Paroxysmal atrial fibrillation: Secondary | ICD-10-CM | POA: Diagnosis not present

## 2021-12-11 DIAGNOSIS — R001 Bradycardia, unspecified: Secondary | ICD-10-CM | POA: Diagnosis not present

## 2021-12-11 DIAGNOSIS — E8770 Fluid overload, unspecified: Secondary | ICD-10-CM

## 2021-12-11 DIAGNOSIS — R0603 Acute respiratory distress: Secondary | ICD-10-CM | POA: Diagnosis not present

## 2021-12-11 DIAGNOSIS — Z8249 Family history of ischemic heart disease and other diseases of the circulatory system: Secondary | ICD-10-CM

## 2021-12-11 DIAGNOSIS — Z4682 Encounter for fitting and adjustment of non-vascular catheter: Secondary | ICD-10-CM | POA: Diagnosis not present

## 2021-12-11 DIAGNOSIS — R509 Fever, unspecified: Secondary | ICD-10-CM

## 2021-12-11 HISTORY — DX: Acute kidney failure, unspecified: N17.9

## 2021-12-11 HISTORY — PX: POSTERIOR CERVICAL FUSION/FORAMINOTOMY: SHX5038

## 2021-12-11 LAB — URINALYSIS, ROUTINE W REFLEX MICROSCOPIC
Bilirubin Urine: NEGATIVE
Glucose, UA: NEGATIVE mg/dL
Ketones, ur: NEGATIVE mg/dL
Leukocytes,Ua: NEGATIVE
Nitrite: NEGATIVE
Protein, ur: NEGATIVE mg/dL
RBC / HPF: >50 RBC/hpf — ABNORMAL HIGH (ref 0–5)
Specific Gravity, Urine: 1.013 (ref 1.005–1.030)
pH: 5 (ref 5.0–8.0)

## 2021-12-11 LAB — BASIC METABOLIC PANEL
Anion gap: 11 (ref 5–15)
BUN: 35 mg/dL — ABNORMAL HIGH (ref 8–23)
CO2: 26 mmol/L (ref 22–32)
Calcium: 9.2 mg/dL (ref 8.9–10.3)
Chloride: 101 mmol/L (ref 98–111)
Creatinine, Ser: 1.35 mg/dL — ABNORMAL HIGH (ref 0.61–1.24)
GFR, Estimated: 59 mL/min — ABNORMAL LOW (ref >60)
Glucose, Bld: 136 mg/dL — ABNORMAL HIGH (ref 70–99)
Potassium: 3.2 mmol/L — ABNORMAL LOW (ref 3.5–5.1)
Sodium: 138 mmol/L (ref 135–145)

## 2021-12-11 LAB — COMPREHENSIVE METABOLIC PANEL
ALT: 30 U/L (ref 0–44)
AST: 27 U/L (ref 15–41)
Albumin: 3.3 g/dL — ABNORMAL LOW (ref 3.5–5.0)
Alkaline Phosphatase: 63 U/L (ref 38–126)
Anion gap: 8 (ref 5–15)
BUN: 31 mg/dL — ABNORMAL HIGH (ref 8–23)
CO2: 28 mmol/L (ref 22–32)
Calcium: 8.7 mg/dL — ABNORMAL LOW (ref 8.9–10.3)
Chloride: 102 mmol/L (ref 98–111)
Creatinine, Ser: 1.54 mg/dL — ABNORMAL HIGH (ref 0.61–1.24)
GFR, Estimated: 50 mL/min — ABNORMAL LOW (ref >60)
Glucose, Bld: 154 mg/dL — ABNORMAL HIGH (ref 70–99)
Potassium: 3.1 mmol/L — ABNORMAL LOW (ref 3.5–5.1)
Sodium: 138 mmol/L (ref 135–145)
Total Bilirubin: 1 mg/dL (ref 0.3–1.2)
Total Protein: 6.5 g/dL (ref 6.5–8.1)

## 2021-12-11 LAB — CBC
HCT: 42.9 % (ref 39.0–52.0)
Hemoglobin: 14.6 g/dL (ref 13.0–17.0)
MCH: 29.4 pg (ref 26.0–34.0)
MCHC: 34 g/dL (ref 30.0–36.0)
MCV: 86.3 fL (ref 80.0–100.0)
Platelets: 291 10*3/uL (ref 150–400)
RBC: 4.97 MIL/uL (ref 4.22–5.81)
RDW: 13 % (ref 11.5–15.5)
WBC: 10.8 10*3/uL — ABNORMAL HIGH (ref 4.0–10.5)
nRBC: 0 % (ref 0.0–0.2)

## 2021-12-11 LAB — MAGNESIUM: Magnesium: 1.8 mg/dL (ref 1.7–2.4)

## 2021-12-11 LAB — ABO/RH: ABO/RH(D): O POS

## 2021-12-11 LAB — TROPONIN I (HIGH SENSITIVITY): Troponin I (High Sensitivity): 27 ng/L — ABNORMAL HIGH (ref <18)

## 2021-12-11 IMAGING — DX DG CHEST 1V PORT
1 series · 1 of 1 positions shown · non-contrast
Comparison: Prior chest radiographs [DATE] and earlier.

CLINICAL DATA: Provided history: Postop, shortness of breath.

EXAM:
PORTABLE CHEST 1 VIEW

[chest]
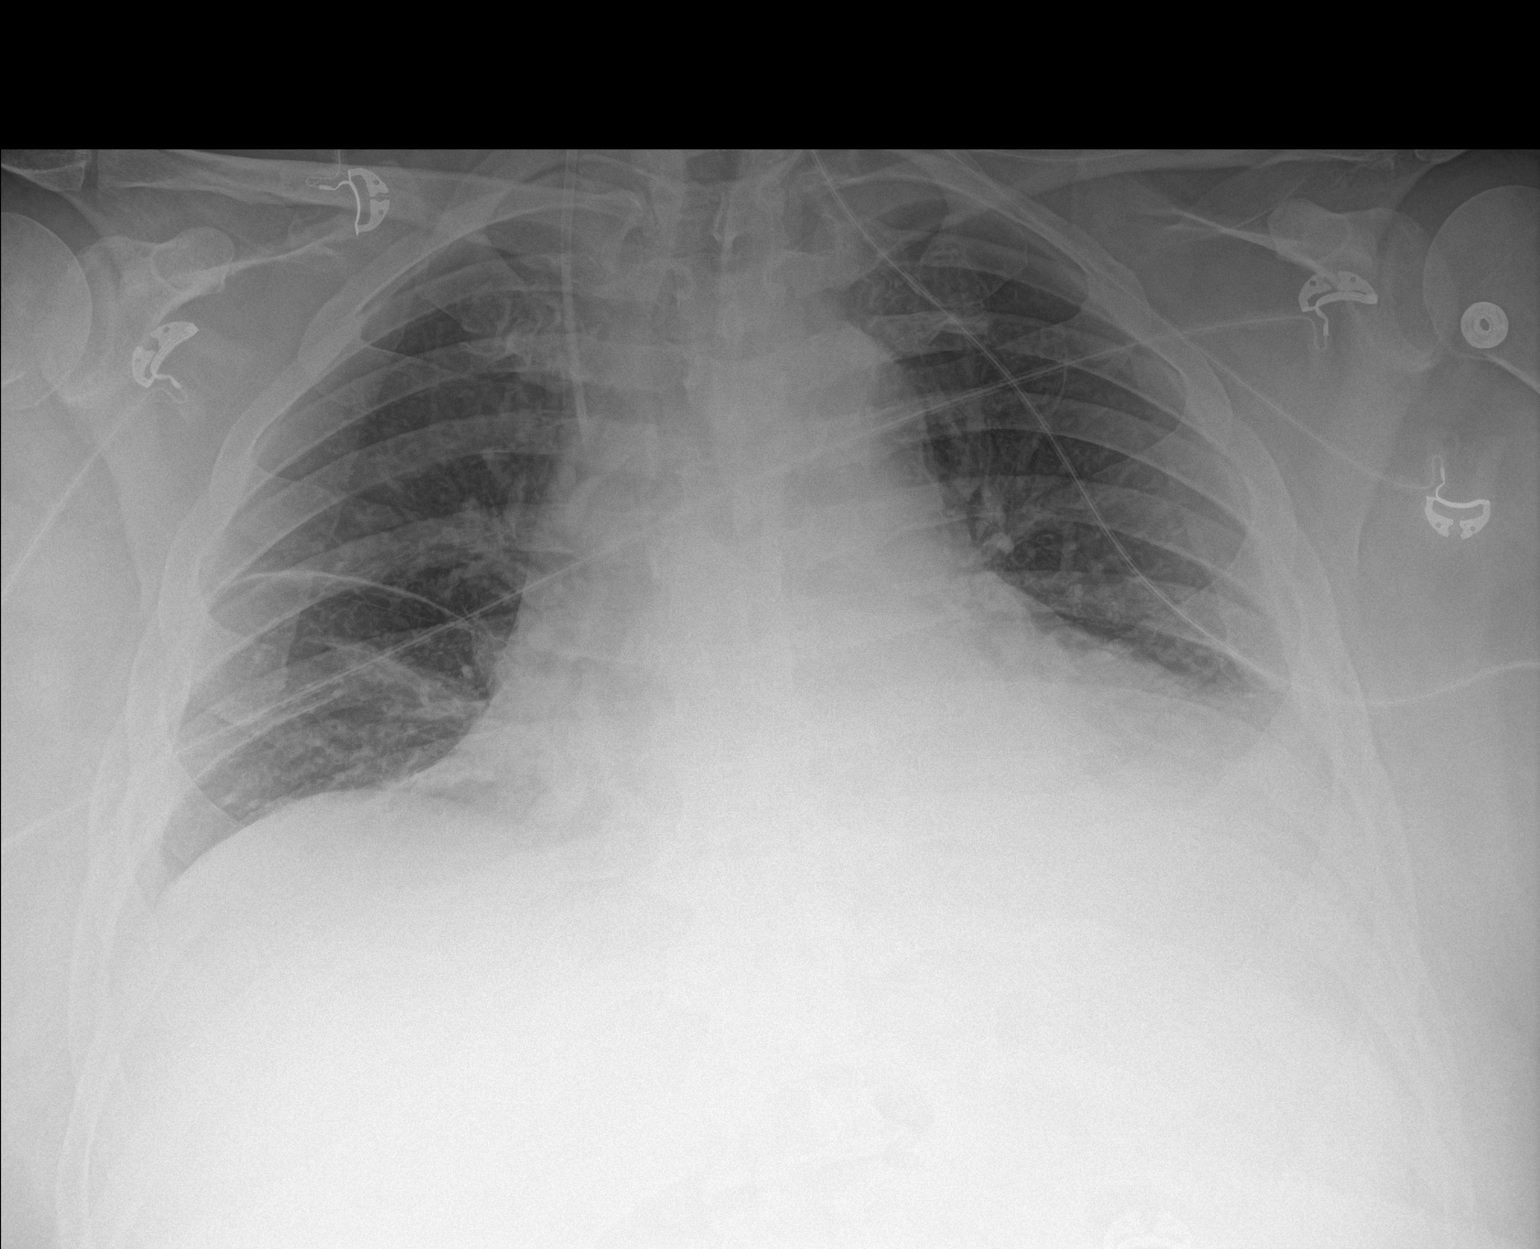

[1 of 1 positions shown; findings below may reference images not displayed]

FINDINGS: Right IJ approach central venous catheter with tip projecting at the
level of the mid-to-lower SVC. Shallow inspiration radiograph,
limiting evaluation of heart size. Ill-defined opacities within the
bilateral lung bases, likely reflecting atelectasis. No evidence of
pleural effusion or pneumothorax. No acute bony abnormality
identified.
IMPRESSION: Right IJ approach central venous catheter with tip projecting at the
level of the mid-to-lower SVC.

Shallow inspiration radiograph.

Ill-defined opacities at the bilateral lung bases, likely reflecting
atelectasis.

## 2021-12-11 SURGERY — POSTERIOR CERVICAL FUSION/FORAMINOTOMY LEVEL 1
Anesthesia: General

## 2021-12-11 MED ORDER — LIDOCAINE-EPINEPHRINE 1 %-1:100000 IJ SOLN
INTRAMUSCULAR | Status: AC
  Filled 2021-12-11: qty 1

## 2021-12-11 MED ORDER — CEFAZOLIN IN SODIUM CHLORIDE 3-0.9 GM/100ML-% IV SOLN
INTRAVENOUS | Status: AC
  Filled 2021-12-11: qty 100

## 2021-12-11 MED ORDER — ALBUMIN HUMAN 5 % IV SOLN
12.5000 g | Freq: Once | INTRAVENOUS | Status: AC
  Administered 2021-12-11: 12.5 g via INTRAVENOUS

## 2021-12-11 MED ORDER — LIDOCAINE 2% (20 MG/ML) 5 ML SYRINGE
INTRAMUSCULAR | Status: DC | PRN
Start: 2021-12-11 — End: 2021-12-11
  Administered 2021-12-11: 100 mg via INTRAVENOUS

## 2021-12-11 MED ORDER — CHLORHEXIDINE GLUCONATE CLOTH 2 % EX PADS: 6.0000 | MEDICATED_PAD | Freq: Once | CUTANEOUS | Status: DC

## 2021-12-11 MED ORDER — MIDAZOLAM HCL 2 MG/2ML IJ SOLN
INTRAMUSCULAR | Status: DC | PRN
  Administered 2021-12-11: 2 mg via INTRAVENOUS

## 2021-12-11 MED ORDER — POTASSIUM CHLORIDE CRYS ER 20 MEQ PO TBCR
20.0000 meq | EXTENDED_RELEASE_TABLET | ORAL | Status: DC
  Filled 2021-12-11: qty 1

## 2021-12-11 MED ORDER — PHENYLEPHRINE 80 MCG/ML (10ML) SYRINGE FOR IV PUSH (FOR BLOOD PRESSURE SUPPORT)
PREFILLED_SYRINGE | INTRAVENOUS | Status: AC
  Filled 2021-12-11: qty 10

## 2021-12-11 MED ORDER — SUGAMMADEX SODIUM 200 MG/2ML IV SOLN
INTRAVENOUS | Status: DC | PRN
  Administered 2021-12-11: 100 mg via INTRAVENOUS
  Administered 2021-12-11: 300 mg via INTRAVENOUS

## 2021-12-11 MED ORDER — LACTATED RINGERS IV SOLN: INTRAVENOUS | Status: DC

## 2021-12-11 MED ORDER — THROMBIN 5000 UNITS EX SOLR
CUTANEOUS | Status: AC
  Filled 2021-12-11: qty 5000

## 2021-12-11 MED ORDER — SODIUM CHLORIDE 0.9% FLUSH: 3.0000 mL | INTRAVENOUS | Status: DC | PRN

## 2021-12-11 MED ORDER — POTASSIUM CHLORIDE 10 MEQ/50ML IV SOLN
10.0000 meq | INTRAVENOUS | Status: AC
  Administered 2021-12-11 (×4): 10 meq via INTRAVENOUS
  Filled 2021-12-11 (×4): qty 50

## 2021-12-11 MED ORDER — CEFAZOLIN SODIUM-DEXTROSE 2-4 GM/100ML-% IV SOLN
2.0000 g | Freq: Three times a day (TID) | INTRAVENOUS | Status: AC
  Administered 2021-12-11 – 2021-12-12 (×2): 2 g via INTRAVENOUS
  Filled 2021-12-11 (×2): qty 100

## 2021-12-11 MED ORDER — PHENYLEPHRINE HCL-NACL 20-0.9 MG/250ML-% IV SOLN
INTRAVENOUS | Status: DC | PRN
Start: 2021-12-11 — End: 2021-12-11
  Administered 2021-12-11: 40 ug/min via INTRAVENOUS

## 2021-12-11 MED ORDER — CHLORHEXIDINE GLUCONATE 0.12 % MT SOLN: 15.0000 mL | Freq: Once | OROMUCOSAL | Status: AC

## 2021-12-11 MED ORDER — CHLORHEXIDINE GLUCONATE 0.12 % MT SOLN
OROMUCOSAL | Status: AC
  Administered 2021-12-11: 15 mL via OROMUCOSAL
  Filled 2021-12-11: qty 15

## 2021-12-11 MED ORDER — CYCLOBENZAPRINE HCL 10 MG PO TABS: 10.0000 mg | ORAL_TABLET | Freq: Three times a day (TID) | ORAL | Status: DC | PRN

## 2021-12-11 MED ORDER — FENTANYL CITRATE (PF) 250 MCG/5ML IJ SOLN
INTRAMUSCULAR | Status: DC | PRN
Start: 2021-12-11 — End: 2021-12-11
  Administered 2021-12-11 (×3): 50 ug via INTRAVENOUS

## 2021-12-11 MED ORDER — OXYCODONE HCL 5 MG PO TABS
10.0000 mg | ORAL_TABLET | ORAL | Status: DC | PRN
  Administered 2021-12-15 (×2): 10 mg via ORAL
  Filled 2021-12-11 (×2): qty 2

## 2021-12-11 MED ORDER — DEXMEDETOMIDINE (PRECEDEX) IN NS 20 MCG/5ML (4 MCG/ML) IV SYRINGE
PREFILLED_SYRINGE | INTRAVENOUS | Status: DC | PRN
  Administered 2021-12-11: 12 ug via INTRAVENOUS
  Administered 2021-12-11: 8 ug via INTRAVENOUS

## 2021-12-11 MED ORDER — LABETALOL HCL 5 MG/ML IV SOLN
INTRAVENOUS | Status: DC | PRN
  Administered 2021-12-11: 10 mg via INTRAVENOUS

## 2021-12-11 MED ORDER — ONDANSETRON HCL 4 MG/2ML IJ SOLN: 4.0000 mg | Freq: Four times a day (QID) | INTRAMUSCULAR | Status: DC | PRN

## 2021-12-11 MED ORDER — SUCCINYLCHOLINE CHLORIDE 200 MG/10ML IV SOSY
PREFILLED_SYRINGE | INTRAVENOUS | Status: AC
  Filled 2021-12-11: qty 10

## 2021-12-11 MED ORDER — CEFAZOLIN IN SODIUM CHLORIDE 3-0.9 GM/100ML-% IV SOLN
3.0000 g | INTRAVENOUS | Status: AC
  Administered 2021-12-11: 3 g via INTRAVENOUS

## 2021-12-11 MED ORDER — POLYETHYLENE GLYCOL 3350 17 G PO PACK: 17.0000 g | PACK | Freq: Every day | ORAL | Status: DC | PRN

## 2021-12-11 MED ORDER — PHENYLEPHRINE HCL-NACL 20-0.9 MG/250ML-% IV SOLN
0.0000 ug/min | INTRAVENOUS | Status: DC
  Administered 2021-12-11: 30 ug/min via INTRAVENOUS
  Filled 2021-12-11: qty 250

## 2021-12-11 MED ORDER — FENTANYL CITRATE (PF) 250 MCG/5ML IJ SOLN
INTRAMUSCULAR | Status: AC
  Filled 2021-12-11: qty 5

## 2021-12-11 MED ORDER — FENTANYL CITRATE (PF) 100 MCG/2ML IJ SOLN: 25.0000 ug | INTRAMUSCULAR | Status: DC | PRN

## 2021-12-11 MED ORDER — SUCCINYLCHOLINE CHLORIDE 200 MG/10ML IV SOSY
PREFILLED_SYRINGE | INTRAVENOUS | Status: DC | PRN
  Administered 2021-12-11: 140 mg via INTRAVENOUS

## 2021-12-11 MED ORDER — MIDAZOLAM HCL 2 MG/2ML IJ SOLN
INTRAMUSCULAR | Status: AC
  Filled 2021-12-11: qty 2

## 2021-12-11 MED ORDER — PROPOFOL 10 MG/ML IV BOLUS
INTRAVENOUS | Status: AC
  Filled 2021-12-11: qty 20

## 2021-12-11 MED ORDER — SODIUM CHLORIDE 0.9 % IV SOLN: INTRAVENOUS | Status: DC

## 2021-12-11 MED ORDER — PROPOFOL 10 MG/ML IV BOLUS
INTRAVENOUS | Status: DC | PRN
Start: 2021-12-11 — End: 2021-12-11
  Administered 2021-12-11: 50 mg via INTRAVENOUS
  Administered 2021-12-11: 150 mg via INTRAVENOUS

## 2021-12-11 MED ORDER — ROCURONIUM BROMIDE 10 MG/ML (PF) SYRINGE
PREFILLED_SYRINGE | INTRAVENOUS | Status: AC
  Filled 2021-12-11: qty 10

## 2021-12-11 MED ORDER — EPINEPHRINE 1 MG/10ML IJ SOSY
PREFILLED_SYRINGE | INTRAMUSCULAR | Status: DC | PRN
  Administered 2021-12-11: 100 ug via INTRAVENOUS

## 2021-12-11 MED ORDER — ORAL CARE MOUTH RINSE: 15.0000 mL | Freq: Once | OROMUCOSAL | Status: AC

## 2021-12-11 MED ORDER — LACTATED RINGERS IV SOLN: INTRAVENOUS | Status: DC | PRN

## 2021-12-11 MED ORDER — ONDANSETRON HCL 4 MG PO TABS: 4.0000 mg | ORAL_TABLET | Freq: Four times a day (QID) | ORAL | Status: DC | PRN

## 2021-12-11 MED ORDER — BACITRACIN ZINC 500 UNIT/GM EX OINT
TOPICAL_OINTMENT | CUTANEOUS | Status: AC
  Filled 2021-12-11: qty 28.35

## 2021-12-11 MED ORDER — OXYCODONE HCL 5 MG PO TABS: 5.0000 mg | ORAL_TABLET | ORAL | Status: DC | PRN

## 2021-12-11 MED ORDER — PHENYLEPHRINE HCL (PRESSORS) 10 MG/ML IV SOLN
INTRAVENOUS | Status: AC
  Filled 2021-12-11: qty 1

## 2021-12-11 MED ORDER — SODIUM CHLORIDE 0.9 % IV SOLN: 250.0000 mL | INTRAVENOUS | Status: DC

## 2021-12-11 MED ORDER — ACETAMINOPHEN 650 MG RE SUPP: 650.0000 mg | RECTAL | Status: DC | PRN

## 2021-12-11 MED ORDER — SODIUM CHLORIDE 0.9% FLUSH
3.0000 mL | Freq: Two times a day (BID) | INTRAVENOUS | Status: DC
  Administered 2021-12-12 – 2021-12-20 (×13): 3 mL via INTRAVENOUS

## 2021-12-11 MED ORDER — HYDROMORPHONE HCL 1 MG/ML IJ SOLN
1.0000 mg | INTRAMUSCULAR | Status: DC | PRN
  Administered 2021-12-15 (×2): 1 mg via INTRAVENOUS
  Filled 2021-12-11 (×2): qty 1

## 2021-12-11 MED ORDER — ALBUTEROL SULFATE (2.5 MG/3ML) 0.083% IN NEBU
INHALATION_SOLUTION | RESPIRATORY_TRACT | Status: AC
  Filled 2021-12-11: qty 9

## 2021-12-11 MED ORDER — ACETAMINOPHEN 325 MG PO TABS
650.0000 mg | ORAL_TABLET | ORAL | Status: DC | PRN
  Administered 2021-12-15 – 2021-12-16 (×5): 650 mg via ORAL
  Filled 2021-12-11 (×5): qty 2

## 2021-12-11 MED ORDER — DOCUSATE SODIUM 100 MG PO CAPS: 100.0000 mg | ORAL_CAPSULE | Freq: Two times a day (BID) | ORAL | Status: DC

## 2021-12-11 MED ORDER — BUPIVACAINE HCL (PF) 0.5 % IJ SOLN
INTRAMUSCULAR | Status: AC
  Filled 2021-12-11: qty 30

## 2021-12-11 MED ORDER — LIDOCAINE 2% (20 MG/ML) 5 ML SYRINGE
INTRAMUSCULAR | Status: AC
  Filled 2021-12-11: qty 5

## 2021-12-11 MED ORDER — ROCURONIUM BROMIDE 10 MG/ML (PF) SYRINGE
PREFILLED_SYRINGE | INTRAVENOUS | Status: DC | PRN
  Administered 2021-12-11: 70 mg via INTRAVENOUS

## 2021-12-11 MED ORDER — PHENYLEPHRINE 80 MCG/ML (10ML) SYRINGE FOR IV PUSH (FOR BLOOD PRESSURE SUPPORT)
PREFILLED_SYRINGE | INTRAVENOUS | Status: DC | PRN
  Administered 2021-12-11: 80 ug via INTRAVENOUS
  Administered 2021-12-11: 120 ug via INTRAVENOUS
  Administered 2021-12-11: 80 ug via INTRAVENOUS

## 2021-12-11 MED ORDER — PHENOL 1.4 % MT LIQD
1.0000 | OROMUCOSAL | Status: DC | PRN
  Filled 2021-12-11: qty 177

## 2021-12-11 MED ORDER — ALBUMIN HUMAN 5 % IV SOLN
INTRAVENOUS | Status: AC
  Filled 2021-12-11: qty 250

## 2021-12-11 MED ORDER — MENTHOL 3 MG MT LOZG
1.0000 | LOZENGE | OROMUCOSAL | Status: DC | PRN
  Filled 2021-12-11: qty 9

## 2021-12-11 MED ORDER — ALBUTEROL SULFATE (2.5 MG/3ML) 0.083% IN NEBU
INHALATION_SOLUTION | RESPIRATORY_TRACT | Status: AC
  Filled 2021-12-11: qty 3

## 2021-12-11 SURGICAL SUPPLY — 59 items
BAG COUNTER SPONGE SURGICOUNT (BAG) ×1 IMPLANT
BAND RUBBER #18 3X1/16 STRL (MISCELLANEOUS) ×2 IMPLANT
BENZOIN TINCTURE PRP APPL 2/3 (GAUZE/BANDAGES/DRESSINGS) IMPLANT
BLADE CLIPPER SURG (BLADE) IMPLANT
BLADE SURG 11 STRL SS (BLADE) ×1 IMPLANT
BUR MATCHSTICK NEURO 3.0 LAGG (BURR) ×1 IMPLANT
BUR PRECISION FLUTE 5.0 (BURR) ×1 IMPLANT
CANISTER SUCT 3000ML PPV (MISCELLANEOUS) ×1 IMPLANT
CNTNR URN SCR LID CUP LEK RST (MISCELLANEOUS) ×1 IMPLANT
CONT SPEC 4OZ STRL OR WHT (MISCELLANEOUS)
DECANTER SPIKE VIAL GLASS SM (MISCELLANEOUS) ×1 IMPLANT
DERMABOND ADVANCED (GAUZE/BANDAGES/DRESSINGS)
DERMABOND ADVANCED .7 DNX12 (GAUZE/BANDAGES/DRESSINGS) ×1 IMPLANT
DRAPE C-ARM 42X72 X-RAY (DRAPES) ×2 IMPLANT
DRAPE LAPAROTOMY 100X72 PEDS (DRAPES) ×1 IMPLANT
DRAPE MICROSCOPE LEICA (MISCELLANEOUS) IMPLANT
DRAPE SURG 17X23 STRL (DRAPES) ×1 IMPLANT
DURAPREP 26ML APPLICATOR (WOUND CARE) ×1 IMPLANT
ELECT REM PT RETURN 9FT ADLT (ELECTROSURGICAL)
ELECTRODE REM PT RTRN 9FT ADLT (ELECTROSURGICAL) ×1 IMPLANT
GAUZE 4X4 16PLY ~~LOC~~+RFID DBL (SPONGE) IMPLANT
GAUZE SPONGE 4X4 12PLY STRL (GAUZE/BANDAGES/DRESSINGS) IMPLANT
GLOVE BIOGEL PI IND STRL 7.5 (GLOVE) ×2 IMPLANT
GLOVE BIOGEL PI INDICATOR 7.5 (GLOVE)
GLOVE ECLIPSE 7.5 STRL STRAW (GLOVE) ×2 IMPLANT
GLOVE EXAM NITRILE LRG STRL (GLOVE) IMPLANT
GLOVE EXAM NITRILE XL STR (GLOVE) IMPLANT
GLOVE EXAM NITRILE XS STR PU (GLOVE) IMPLANT
GOWN STRL REUS W/ TWL LRG LVL3 (GOWN DISPOSABLE) ×4 IMPLANT
GOWN STRL REUS W/ TWL XL LVL3 (GOWN DISPOSABLE) IMPLANT
GOWN STRL REUS W/TWL 2XL LVL3 (GOWN DISPOSABLE) IMPLANT
GOWN STRL REUS W/TWL LRG LVL3 (GOWN DISPOSABLE)
GOWN STRL REUS W/TWL XL LVL3 (GOWN DISPOSABLE)
HEMOSTAT POWDER KIT SURGIFOAM (HEMOSTASIS) ×1 IMPLANT
KIT BASIN OR (CUSTOM PROCEDURE TRAY) ×1 IMPLANT
KIT TURNOVER KIT B (KITS) ×1 IMPLANT
NDL HYPO 18GX1.5 BLUNT FILL (NEEDLE) IMPLANT
NDL SPNL 18GX3.5 QUINCKE PK (NEEDLE) IMPLANT
NEEDLE HYPO 18GX1.5 BLUNT FILL (NEEDLE) IMPLANT
NEEDLE HYPO 22GX1.5 SAFETY (NEEDLE) ×1 IMPLANT
NEEDLE SPNL 18GX3.5 QUINCKE PK (NEEDLE) IMPLANT
NS IRRIG 1000ML POUR BTL (IV SOLUTION) ×1 IMPLANT
PACK LAMINECTOMY NEURO (CUSTOM PROCEDURE TRAY) ×1 IMPLANT
PAD ARMBOARD 7.5X6 YLW CONV (MISCELLANEOUS) ×5 IMPLANT
PIN MAYFIELD SKULL DISP (PIN) ×1 IMPLANT
SPONGE SURGIFOAM ABS GEL 100 (HEMOSTASIS) IMPLANT
SPONGE T-LAP 4X18 ~~LOC~~+RFID (SPONGE) IMPLANT
STRIP CLOSURE SKIN 1/2X4 (GAUZE/BANDAGES/DRESSINGS) IMPLANT
SUT MNCRL AB 3-0 PS2 18 (SUTURE) ×1 IMPLANT
SUT MON AB 3-0 SH 27 (SUTURE)
SUT MON AB 3-0 SH27 (SUTURE) ×1 IMPLANT
SUT PROLENE 6 0 BV (SUTURE) IMPLANT
SUT VIC AB 0 CT1 18XCR BRD8 (SUTURE) ×1 IMPLANT
SUT VIC AB 0 CT1 8-18 (SUTURE)
SYR 3ML LL SCALE MARK (SYRINGE) IMPLANT
TOWEL GREEN STERILE (TOWEL DISPOSABLE) ×1 IMPLANT
TOWEL GREEN STERILE FF (TOWEL DISPOSABLE) ×1 IMPLANT
TRAY FOLEY MTR SLVR 16FR STAT (SET/KITS/TRAYS/PACK) ×2 IMPLANT
WATER STERILE IRR 1000ML POUR (IV SOLUTION) ×1 IMPLANT

## 2021-12-11 NOTE — Anesthesia Procedure Notes (Signed)
Central Venous Catheter Insertion ?Performed by: Belinda Block, MD, anesthesiologist ?Start/End05/17/2023 1:00 PM, 12/30/2021 1:15 PM ?Patient location: OR. ?Preanesthetic checklist: patient identified, IV checked, site marked, risks and benefits discussed, surgical consent, monitors and equipment checked, pre-op evaluation and timeout performed ?Position: Trendelenburg ?Lidocaine 1% used for infiltration and patient sedated ?Hand hygiene performed , maximum sterile barriers used  and Seldinger technique used ?Double lumen ?Procedure performed using ultrasound guided technique. ?Ultrasound Notes:anatomy identified ?Attempts: 1 ?Following insertion, line sutured. ?Post procedure assessment: blood return through all ports ? ?Patient tolerated the procedure well with no immediate complications. ? ? ? ?

## 2021-12-11 NOTE — Anesthesia Procedure Notes (Addendum)
Procedure Name: Intubation ?Date/Time: 12/23/2021 12:45 PM ?Performed by: Amadeo Garnet, CRNA ?Pre-anesthesia Checklist: Patient identified, Emergency Drugs available, Suction available and Patient being monitored ?Patient Re-evaluated:Patient Re-evaluated prior to induction ?Oxygen Delivery Method: Circle system utilized ?Preoxygenation: Pre-oxygenation with 100% oxygen ?Induction Type: IV induction ?Ventilation: Mask ventilation without difficulty and Oral airway inserted - appropriate to patient size ?Laryngoscope Size: Glidescope and 4 ?Grade View: Grade I ?Tube type: Oral ?Tube size: 7.5 mm ?Number of attempts: 1 ?Airway Equipment and Method: Stylet and Oral airway ?Placement Confirmation: ETT inserted through vocal cords under direct vision, positive ETCO2 and breath sounds checked- equal and bilateral ?Secured at: 23 cm ?Tube secured with: Tape ?Dental Injury: Teeth and Oropharynx as per pre-operative assessment  ? ? ? ? ?

## 2021-12-11 NOTE — Anesthesia Postprocedure Evaluation (Signed)
Anesthesia Post Note ? ?Patient: Adam Moreno ? ?Procedure(s) Performed: CANCELED PROCEDURE ? ?  ? ?Patient location during evaluation: PACU ?Anesthesia Type: General ?Level of consciousness: awake ?Pain management: pain level controlled ?Respiratory status: spontaneous breathing ?Cardiovascular status: stable ?Postop Assessment: no apparent nausea or vomiting ?Anesthetic complications: no ? ? ?No notable events documented. ? ?Last Vitals:  ?Vitals:  ? 12/10/2021 1515 12/29/2021 1600  ?BP: (!) 101/59 92/68  ?Pulse: 78   ?Resp: 20   ?Temp: (!) 36.1 ?C   ?SpO2: 94%   ?  ?Last Pain:  ?Vitals:  ? 12/12/2021 1515  ?TempSrc:   ?PainSc: 0-No pain  ? ? ?  ?  ?  ?  ?  ?  ? ?Christino Mcglinchey ? ? ? ? ?

## 2021-12-11 NOTE — Progress Notes (Signed)
Patient refused CPAP. No unit in room at this time. 

## 2021-12-11 NOTE — H&P (Signed)
Surgical H&P Update ? ?HPI: 66 y.o. with a history of intermittent dysarthria and dysphagia. Workup showed a large mass c/w a nerve sheath tumor int he right side of the canal at C1-2. No changes in health since they were last seen, today no dysarthria or dysphagia last night, but intermittently feels like it has been worsening.  ? ?PMHx:  ?Past Medical History:  ?Diagnosis Date  ? Depression   ? Hypertension   ? Sleep apnea   ? doesn't wear CPAP  ? ?FamHx:  ?Family History  ?Problem Relation Age of Onset  ? Heart attack Father 11  ?     only 1  ? Hypertension Brother   ? ?SocHx:  reports that he has never smoked. He has never used smokeless tobacco. He reports current alcohol use. He reports that he does not currently use drugs after having used the following drugs: Marijuana, Cocaine, Methamphetamines, and LSD. ? ?Physical Exam: ?Aox3, PERRL, EOMI, FS & SS, tongue midline with full ROM and bulk, palate symmetric, vocal quality slightly nasal, strength 5/5x4, SILTx4 ? ?Assesment/Plan: ?66 y.o. 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 Risks, benefits, and alternatives discussed and the patient would like to continue with surgery. ? ?-OR today ?-4NP post-op ? ?Judith Part, MD ?12/25/2021 ?12:04 PM  ?

## 2021-12-11 NOTE — Consult Note (Signed)
? ?NAME:  Adam Moreno, MRN:  [id number], DOB:  02/11/1956, LOS: 0 ?ADMISSION DATE:  12/27/2021 CONSULTATION DATE:  12/12/2021 ?REFERRING MD:  Zada Finders - NSGY CHIEF COMPLAINT:  Hypotension  ? ?History of Present Illness:  ?66 year old man who presented to [facility] 5/9 for planned laminectomy/surgical resection of C1-C2 nerve sheath tumor with NSGY (Dr. Zada Finders). 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 ? ?Patient presented for scheduled laminectomy/nerve sheath tumor resection 5/9. Post-induction/intubation prior to the procedure, patient became hypotensive and it was unclear if he had a pulse (true pulselessness versus instrumentation). Epi was administered x 1 and a short course of CPR was completed with return of palpable pulse. Case was aborted. Patient was successfully extubated in the OR with appropriate mental status. Placed on Neosynephrine for BP support. Patient was transferred to ICU for further workup. ? ?PCCM consulted for assistance with hypotension workup. ? ?Pertinent Medical History:  ? ?Past Medical History:  ?Diagnosis Date  ? Depression   ? Hypertension   ? Sleep apnea   ? doesn't wear CPAP  ? ?Significant Hospital Events: ?Including procedures, antibiotic start and stop dates in addition to other pertinent events   ?5/9 - Presented to [facility] for planned C1-C2 nerve sheath tumor resection/laminectomy. Hypotensive shortly after induction/intubation requiring case to be aborted. Extubated in OR. Transferred to ICU for further cardiac workup. ? ?Interim History / Subjective:  ?PCCM consulted for assistance with hypotension workup ?Feeling ok, reports mild HA ?Denies CP/SOB, n/v, numbness/weakness ?EKG normal in PACU ?CXR with bibasilar atelectasis, low lung volumes ?Labs pending ? ?Objective:  ?Blood pressure 129/77, pulse 90, temperature 98.1 ?F (36.7 ?C), temperature source Oral, resp. rate 18, height 6'  (1.829 m), weight (!) 147.4 kg, SpO2 95 %. ?   ?   ?No intake or output data in the 24 hours ending 12/06/2021 1352 ?Filed Weights  ? 12/17/2021 0950  ?Weight: (!) 147.4 kg  ? ?Physical Examination: ?General: Overall well-appearing middle-aged man in NAD. Appears slightly drowsy. ?HEENT: [place]/AT, anicteric sclera, PERRL, moist mucous membranes. ?Neuro: Awake, oriented x 4. Responds to verbal stimuli. Following commands consistently. Moves all 4 extremities spontaneously.  ?CV: RRR, no m/g/r. ?PULM: Breathing even and unlabored on 5L O2. Lung fields diminished bilaterally due to low lung volumes. No wheeze/rhonchi or rales. ?GI: Obese, soft, nontender, mildly distended. Normoactive bowel sounds. ?Extremities: No LE edema noted. ?Skin: Warm/dry, no rashes. ? ?Resolved Hospital Problem List:  ? ? ?Assessment & Plan:  ?Mr. [name] is seen in consultation at the request of Dr. Zada Finders ([place]) for recommendations on further evaluation and management of hypotension in the setting of OR induction/intubation. ? ?Hypotension in the setting of anesthesia induction/intubation ??Cardiac arrest ?Post-induction/intubation prior to the procedure, patient became hypotensive and it was unclear if he had a pulse (true pulselessness versus instrumentation). Epi was administered x 1 and a short course of CPR was completed with return of palpable pulse. EKG unremarkable. ?- Admit to ICU for close monitoring ?- Goal MAP > 65 ?- Neo-Synephrine titrated to goal MAP ?- Cardiology consult ?- F/u Echo ?- Trend troponins, BNP ? ?Respiratory insufficiency, peri-procedural ?History of OSA ?- Continue supplemental O2 support ?- Wean O2 for sat > 90% ?- Pulmonary hygiene ?- CPAP QHS, if willing to tolerate ? ?Schwannoma of C1-C2 nerve sheath ?Presented 5/9 for planned laminectomy/nerve sheath tumor resection with Dr. Zada Finders ([place]). Case aborted in the setting of hypotension post-induction/intubation. ?- NSGY primary ?- Case postponed  in the setting of  hypotensive episode/?pulselessness ?- Awaiting further cardiac workup ? ?Hypokalemia ?CKD? ?Mild dysuria, likely catheter-associated ?- Trend BMP ?- Replete electrolytes as indicated ?- Monitor I&Os ?- F/u urine studies ?- Avoid nephrotoxic agents as able ?- Ensure adequate renal perfusion ? ?Hypertension ?Home regimen: Norvasc, Lasix, Toprol-XL, valsartan-HCTZ ?- Hold home antihypertensives at present in the setting of hypotension ?- Resume diuretics as clinically appropriate ? ?Best Practice: (right click and "Reselect all SmartList Selections" daily)  ? ?Per Primary Team ? ?Labs:  ?CBC: ?Recent Labs  ?Lab 12/07/21 ?0930  ?WBC 12.9*  ?HGB 17.8*  ?HCT 51.1  ?MCV 85.6  ?PLT 379  ? ?Basic Metabolic Panel: ?Recent Labs  ?Lab 12/07/21 ?0930 12/09/2021 ?1035  ?NA 138 138  ?K 3.7 3.2*  ?CL 98 101  ?CO2 25 26  ?GLUCOSE 114* 136*  ?BUN 41* 35*  ?CREATININE 1.90* 1.35*  ?CALCIUM 9.4 9.2  ? ?GFR: ?Estimated Creatinine Clearance: 82.5 mL/min (A) (by C-G formula based on SCr of 1.35 mg/dL (H)). ?Recent Labs  ?Lab 12/07/21 ?0930  ?WBC 12.9*  ? ?Liver Function Tests: ?No results for input(s): AST, ALT, ALKPHOS, BILITOT, PROT, ALBUMIN in the last 168 hours. ?No results for input(s): LIPASE, AMYLASE in the last 168 hours. ?No results for input(s): AMMONIA in the last 168 hours. ? ?ABG: ?No results found for: PHART, PCO2ART, PO2ART, HCO3, TCO2, ACIDBASEDEF, O2SAT  ? ?Coagulation Profile: ?No results for input(s): INR, PROTIME in the last 168 hours. ? ?Cardiac Enzymes: ?No results for input(s): CKTOTAL, CKMB, CKMBINDEX, TROPONINI in the last 168 hours. ? ?HbA1C: ?No results found for: HGBA1C ? ?CBG: ?No results for input(s): GLUCAP in the last 168 hours. ? ?Review of Systems:   ?Review of systems completed with pertinent positives/negatives outlined in above HPI. ? ?Past Medical History:  ?He,  has a past medical history of Depression, Hypertension, and Sleep apnea.  ? ?Surgical History:  ? ?Past Surgical History:  ?Procedure  Laterality Date  ? BIOPSY  11/07/2020  ? Procedure: BIOPSY;  Surgeon: Wilford Corner, MD;  Location: [facility] ENDOSCOPY;  Service: Endoscopy;;  ? COLONOSCOPY WITH PROPOFOL N/A 11/07/2020  ? Procedure: COLONOSCOPY WITH PROPOFOL;  Surgeon: Wilford Corner, MD;  Location: [facility] ENDOSCOPY;  Service: Endoscopy;  Laterality: N/A;  ? POLYPECTOMY  11/07/2020  ? Procedure: POLYPECTOMY;  Surgeon: Wilford Corner, MD;  Location: [facility] ENDOSCOPY;  Service: Endoscopy;;  ?  ?Social History:  ? reports that he has never smoked. He has never used smokeless tobacco. He reports current alcohol use. He reports that he does not currently use drugs after having used the following drugs: Marijuana, Cocaine, Methamphetamines, and LSD.  ? ?Family History:  ?His family history includes Heart attack (age of onset: 54) in his father; Hypertension in his brother.  ? ?Allergies: ?No Known Allergies  ? ?Home Medications: ?Prior to Admission medications   ?Medication Sig Start Date End Date Taking? Authorizing Provider  ?amLODipine (NORVASC) 10 MG tablet Take 10 mg by mouth daily. 09/12/20  Yes [provider]  ?DULoxetine (CYMBALTA) 60 MG capsule Take 60 mg by mouth daily.   Yes [provider]  ?furosemide (LASIX) 20 MG tablet TAKE 1 TABLET BY MOUTH EVERY DAY 09/14/21  Yes Patwardhan, Manish J, MD  ?metoprolol succinate (TOPROL-XL) 25 MG 24 hr tablet Take 25 mg by mouth daily. 09/11/20  Yes [provider]  ?rosuvastatin (CRESTOR) 10 MG tablet Take 1 tablet (10 mg total) by mouth daily. 08/08/21 12/05/2021 Yes Patwardhan, Reynold Bowen, MD  ?valsartan-hydrochlorothiazide (DIOVAN-HCT)  320-25 MG tablet Take 1 tablet by mouth daily. 08/04/20  Yes [provider]  ?  ?Critical care time: 38 minutes  ? ?Lestine Mount, PA-C ?[facility] ?12/23/2021 1:52 PM ? ?Please see [email or web address] for pager details. ? ?From 7A-7P if no response, please call [phone] ?After hours, please call ELink [phone] ?

## 2021-12-11 NOTE — Transfer of Care (Signed)
Immediate Anesthesia Transfer of Care Note ? ?Patient: Adam Moreno ? ?Procedure(s) Performed: CANCELED PROCEDURE ? ?Patient Location: PACU ? ?Anesthesia Type:General ? ?Level of Consciousness: awake, alert  and oriented ? ?Airway & Oxygen Therapy: Patient connected to face mask oxygen ? ?Post-op Assessment: Report given to RN, Post -op Vital signs reviewed and stable and Patient moving all extremities ? ?Post vital signs: Reviewed and stable ? ?Last Vitals:  ?Vitals Value Taken Time  ?BP 94/66 12/07/2021 1509  ?Temp 36.1 ?C 12/25/2021 1415  ?Pulse 77 12/03/2021 1512  ?Resp 24 12/18/2021 1512  ?SpO2 94 % 12/10/2021 1512  ?Vitals shown include unvalidated device data. ? ?Last Pain:  ?Vitals:  ? 12/10/2021 1445  ?TempSrc:   ?PainSc: 0-No pain  ?   ? ?  ? ?Complications: No notable events documented. ?

## 2021-12-11 NOTE — Progress Notes (Signed)
Pt became hypotensive p intubation, unable to obtain a BP with cuff and then unable to feel a palpable pulse, was given a dose of epi with a short course of chest compressions with palpable pulse, see anesthesia notes for further details. Hemodynamically stable at this time, but will cancel case and work up further.  ?

## 2021-12-11 NOTE — Anesthesia Procedure Notes (Addendum)
Arterial Line Insertion ?Start/End05/20/2023 1:15 PM, 12/31/2021 1:18 PM ?Performed by: Renato Shin, CRNA, CRNA ? Patient location: OR. ?Preanesthetic checklist: patient identified, IV checked, site marked, risks and benefits discussed, surgical consent, monitors and equipment checked, pre-op evaluation, timeout performed and anesthesia consent ?Left, radial was placed ?Catheter size: 20 G ?Hand hygiene performed  and maximum sterile barriers used  ? ?Attempts: 2 ?Procedure performed using ultrasound guided technique. ?Ultrasound Notes:anatomy identified, needle tip was noted to be adjacent to the nerve/plexus identified and no ultrasound evidence of intravascular and/or intraneural injection ?Following insertion, Biopatch and dressing applied. ?Post procedure assessment: normal ? ?Patient tolerated the procedure well with no immediate complications. ? ? ?

## 2021-12-11 NOTE — Progress Notes (Signed)
?  Transition of Care (TOC) Screening Note ? ? ?Patient Details  ?Name: Adam Moreno ?Date of Birth: 05-Mar-1956 ? ? ?Transition of Care (TOC) CM/SW Contact:    ?Benard Halsted, LCSW ?Phone Number: ?12/31/2021, 5:20 PM ? ? ? ?Transition of Care Department [facility]) has reviewed patient and no TOC needs have been identified at this time. We will continue to monitor patient advancement through interdisciplinary progression rounds. If new patient transition needs arise, please place a TOC consult. ? ? ?

## 2021-12-12 ENCOUNTER — Encounter (HOSPITAL_COMMUNITY): Payer: Self-pay | Admitting: Neurological Surgery

## 2021-12-12 ENCOUNTER — Inpatient Hospital Stay (HOSPITAL_COMMUNITY): Payer: BC Managed Care – PPO

## 2021-12-12 DIAGNOSIS — N179 Acute kidney failure, unspecified: Secondary | ICD-10-CM

## 2021-12-12 DIAGNOSIS — G4733 Obstructive sleep apnea (adult) (pediatric): Secondary | ICD-10-CM

## 2021-12-12 DIAGNOSIS — R008 Other abnormalities of heart beat: Secondary | ICD-10-CM | POA: Diagnosis not present

## 2021-12-12 DIAGNOSIS — E876 Hypokalemia: Secondary | ICD-10-CM

## 2021-12-12 DIAGNOSIS — D334 Benign neoplasm of spinal cord: Secondary | ICD-10-CM | POA: Diagnosis not present

## 2021-12-12 LAB — CBC
HCT: 43 % (ref 39.0–52.0)
Hemoglobin: 14.4 g/dL (ref 13.0–17.0)
MCH: 28.9 pg (ref 26.0–34.0)
MCHC: 33.5 g/dL (ref 30.0–36.0)
MCV: 86.2 fL (ref 80.0–100.0)
Platelets: 256 10*3/uL (ref 150–400)
RBC: 4.99 MIL/uL (ref 4.22–5.81)
RDW: 13.1 % (ref 11.5–15.5)
WBC: 9.8 10*3/uL (ref 4.0–10.5)
nRBC: 0 % (ref 0.0–0.2)

## 2021-12-12 LAB — ECHOCARDIOGRAM COMPLETE
AR max vel: 2.6 cm2
AV Area VTI: 2.53 cm2
AV Area mean vel: 2.56 cm2
AV Mean grad: 5 mmHg
AV Peak grad: 10.8 mmHg
Ao pk vel: 1.64 m/s
Area-P 1/2: 3.15 cm2
Height: 72 in
S' Lateral: 3.4 cm
Weight: 5200 oz

## 2021-12-12 LAB — COMPREHENSIVE METABOLIC PANEL
ALT: 23 U/L (ref 0–44)
AST: 23 U/L (ref 15–41)
Albumin: 3.4 g/dL — ABNORMAL LOW (ref 3.5–5.0)
Alkaline Phosphatase: 53 U/L (ref 38–126)
Anion gap: 9 (ref 5–15)
BUN: 22 mg/dL (ref 8–23)
CO2: 28 mmol/L (ref 22–32)
Calcium: 8.8 mg/dL — ABNORMAL LOW (ref 8.9–10.3)
Chloride: 103 mmol/L (ref 98–111)
Creatinine, Ser: 1.12 mg/dL (ref 0.61–1.24)
GFR, Estimated: >60 mL/min (ref >60)
Glucose, Bld: 111 mg/dL — ABNORMAL HIGH (ref 70–99)
Potassium: 3.3 mmol/L — ABNORMAL LOW (ref 3.5–5.1)
Sodium: 140 mmol/L (ref 135–145)
Total Bilirubin: 1 mg/dL (ref 0.3–1.2)
Total Protein: 6.6 g/dL (ref 6.5–8.1)

## 2021-12-12 LAB — TROPONIN I (HIGH SENSITIVITY): Troponin I (High Sensitivity): 72 ng/L — ABNORMAL HIGH (ref <18)

## 2021-12-12 LAB — MAGNESIUM: Magnesium: 2 mg/dL (ref 1.7–2.4)

## 2021-12-12 LAB — PHOSPHORUS: Phosphorus: 2.9 mg/dL (ref 2.5–4.6)

## 2021-12-12 IMAGING — DX DG CHEST 1V PORT
1 series · 1 of 1 positions shown · non-contrast
Comparison: None Available.

CLINICAL DATA: Short of breath

EXAM:
PORTABLE CHEST 1 VIEW

[chest]
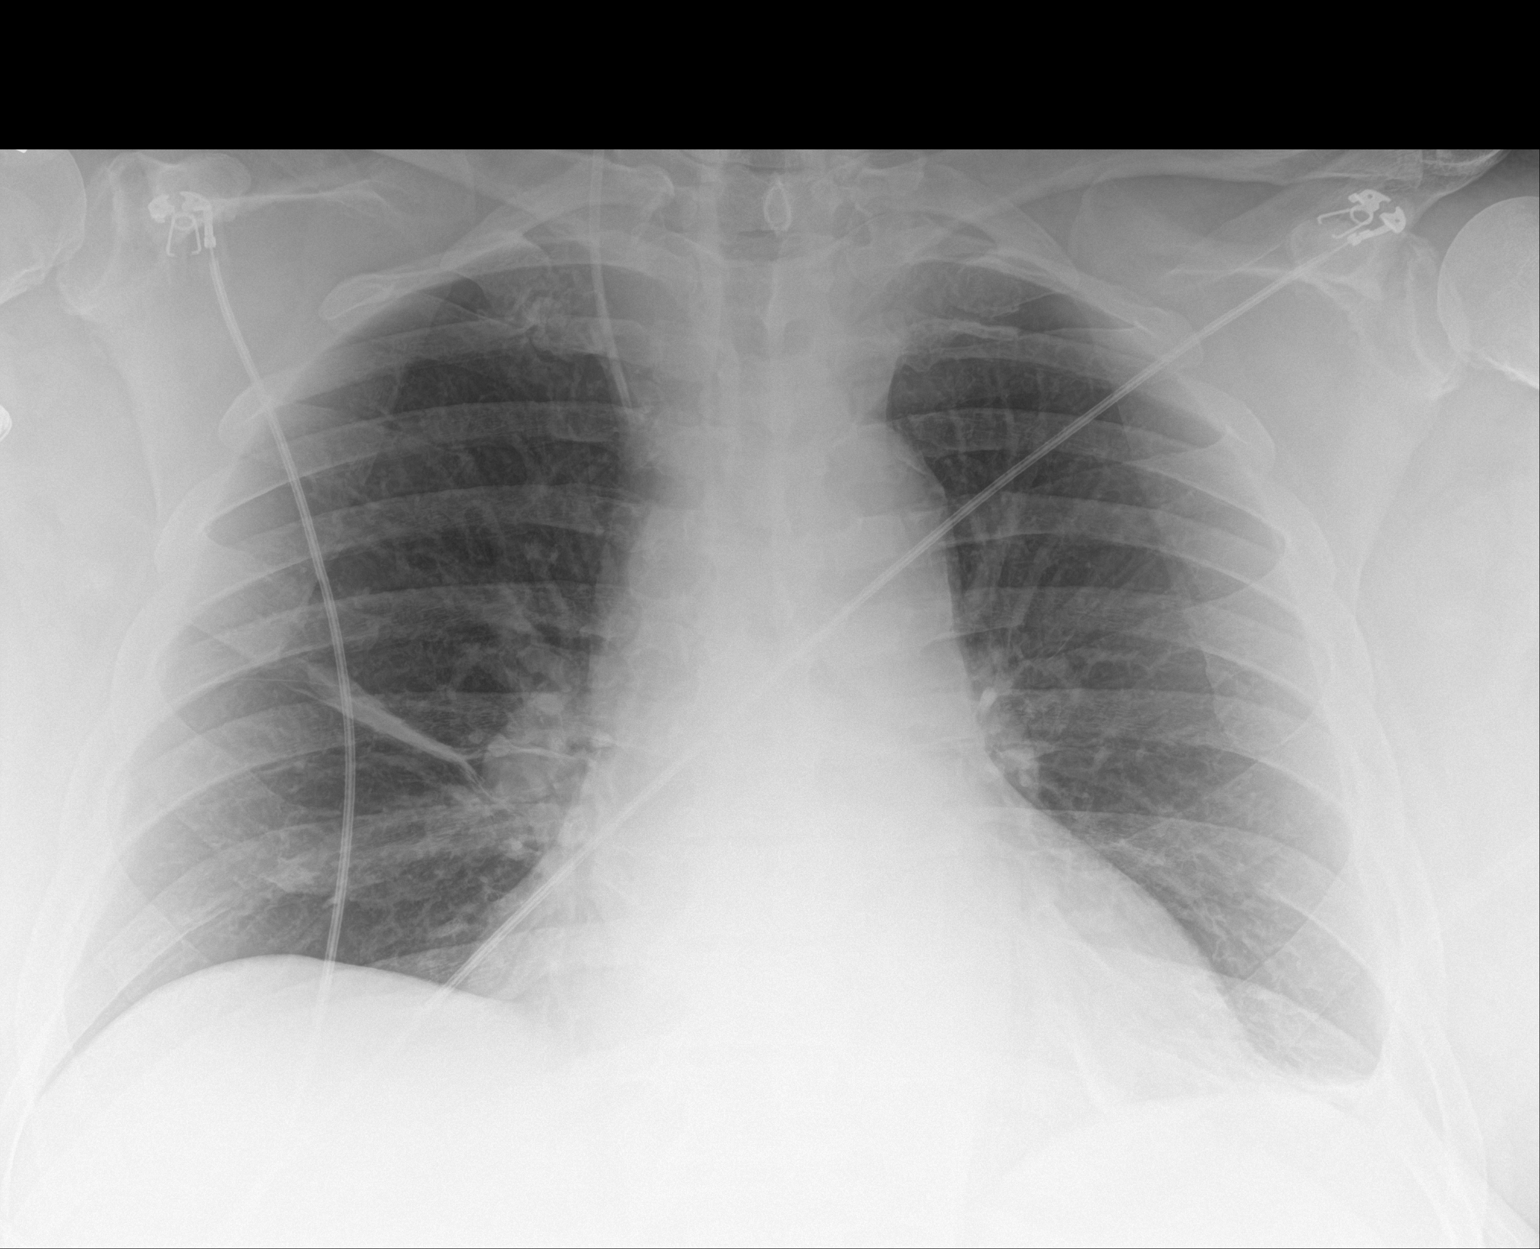

[1 of 1 positions shown; findings below may reference images not displayed]

FINDINGS: Normal cardiac silhouette. Central venous line with tip in mid SVC.
LEFT basilar atelectasis. Platelike atelectasis in the RIGHT lung.
Some improvement aeration to lung bases compared to prior.
IMPRESSION: 1. Improved aeration lung bases compared to prior.
2. LEFT basilar atelectasis.

## 2021-12-12 MED ORDER — CHLORHEXIDINE GLUCONATE CLOTH 2 % EX PADS
6.0000 | MEDICATED_PAD | Freq: Every day | CUTANEOUS | Status: DC
  Administered 2021-12-12 – 2021-12-19 (×9): 6 via TOPICAL

## 2021-12-12 MED ORDER — POTASSIUM CHLORIDE 10 MEQ/50ML IV SOLN
10.0000 meq | INTRAVENOUS | Status: AC
  Administered 2021-12-12 (×4): 10 meq via INTRAVENOUS
  Filled 2021-12-12 (×4): qty 50

## 2021-12-12 NOTE — Progress Notes (Signed)
Neurosurgery Service ?Progress Note ? ?Subjective: No acute events overnight, was weaned off phenylepherine gtt yesterday afternoon w/o issue, no chest pain / SOB ? ?Objective: ?Vitals:  ? 12/12/21 0300 12/12/21 0400 12/12/21 0500 12/12/21 0600  ?BP: 119/72 (!) 108/57 (!) 128/59 (!) 141/90  ?Pulse: 68 68 77 66  ?Resp: '18 15 16 18  '$ ?Temp:  98 ?F (36.7 ?C)    ?TempSrc:  Oral    ?SpO2: 95% 96% 96% 94%  ?Weight:      ?Height:      ? ? ?Physical Exam: ?Aox3, PERRL, EOMI, FS & SS, tongue midline with full ROM and bulk, palate symmetric, strength 5/5x4, SILTx4 ? ?Assessment & Plan: ?65 y.o. 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  ? ?-will send off another troponin, I don't see a second follow up value, workup otherwise unremarkable ?-will d/w OR and anesthesia, potentially could repeat attempt with preop A-line / etc tomorrow afternoon ?-activity / diet as tolerated ? ?Adam Moreno  ?12/12/21 ?7:00 AM ? ?

## 2021-12-12 NOTE — Progress Notes (Signed)
?  Echocardiogram ?2D Echocardiogram has been performed. ? ?Adam Moreno  Adam Moreno ?12/12/2021, 11:58 AM ?

## 2021-12-12 NOTE — Progress Notes (Signed)
Critical value: ? ?Troponin- 72 called in 0855 by lab ?Dr Jeanella Craze notified at [phone] ? ?No new orders taken ?

## 2021-12-12 NOTE — Progress Notes (Signed)
? ?NAME:  Adam Moreno, MRN:  [id number], DOB:  1956/04/08, LOS: 1 ?ADMISSION DATE:  12/25/2021 CONSULTATION DATE:  12/06/2021 ?REFERRING MD:  Zada Finders - NSGY CHIEF COMPLAINT:  Hypotension  ? ?History of Present Illness:  ?66 year old man who presented to [facility] 5/9 for planned laminectomy/surgical resection of C1-C2 nerve sheath tumor with NSGY (Dr. Zada Finders). 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 ? ?Patient presented for scheduled laminectomy/nerve sheath tumor resection 5/9. Post-induction/intubation prior to the procedure, patient became hypotensive and it was unclear if he had a pulse (true pulselessness versus instrumentation). Epi was administered x 1 and a short course of CPR was completed with return of palpable pulse. Case was aborted. Patient was successfully extubated in the OR with appropriate mental status. Placed on Neosynephrine for BP support. Patient was transferred to ICU for further workup. ? ?PCCM consulted for assistance with hypotension workup. ? ?Pertinent Medical History:  ? ?Past Medical History:  ?Diagnosis Date  ? Depression   ? Hypertension   ? Sleep apnea   ? doesn't wear CPAP  ? ?Significant Hospital Events: ?Including procedures, antibiotic start and stop dates in addition to other pertinent events   ?5/9 - Presented to [facility] for planned C1-C2 nerve sheath tumor resection/laminectomy. Hypotensive shortly after induction/intubation requiring case to be aborted. Extubated in OR. Transferred to ICU for further cardiac workup. ? ?Interim History / Subjective:  ?Patient was weaned off of vasopressors, able to maintain MAP of 65 ?Remains on 4 L oxygen via nasal cannula ?Refused CPAP at night ? ?Objective:  ?Blood pressure (!) 117/59, pulse 74, temperature 98.1 ?F (36.7 ?C), temperature source Oral, resp. rate 17, height 6' (1.829 m), weight (!) 147.4 kg, SpO2 94 %. ?   ?   ? ?Intake/Output Summary  (Last 24 hours) at 12/12/2021 1012 ?Last data filed at 12/12/2021 0800 ?[place] per 24 hour  ?Intake 1702.52 ml  ?Output 1300 ml  ?Net 402.52 ml  ? ?Filed Weights  ? 12/07/2021 0950  ?Weight: (!) 147.4 kg  ? ?Physical Examination: ?Physical exam: ?General: Morbidly obese, lying on the bed.  On nasal cannula oxygen ?HEENT: [place]/AT, eyes anicteric.  moist mucus membranes ?Neuro: Alert, awake following commands ?Chest: Coarse breath sounds, no wheezes or rhonchi ?Heart: Regular rate and rhythm, no murmurs or gallops ?Abdomen: Soft, nontender, nondistended, bowel sounds present ?Skin: No rash ? ? ?Resolved Hospital Problem List:  ? ? ?Assessment & Plan:  ?Medication related Hypotension anesthesia induction/intubation ?Question of PEA cardiac arrest after anesthesia induction ?Patient is normotensive now ?Vasopressors were stopped ?There is a question that he may have lost his pulse during that episode of induction and intubation, as CPR was done for 1 rounds with 1 of epi  ?Now he is alert awake and following commands ?Monitor blood pressure closely  ?Serum troponins remains flat ?Follow-up echocardiogram ? ?Morbid obesity ?OSA, noncompliant with CPAP ?Patient is currently on 4 L oxygen via nasal cannula ?Titrated down with O2 sat goal 90% ?He refused CPAP last night, stated he does not like to put it on while asleep ?Encouraged to use CPAP every night to prevent further complications and pulmonary hypertension ? ?Schwannoma of C1-C2 nerve sheath ?Patient was scheduled for surgery on 5/9, which was aborted due to hypotension after anesthesia induction ?Is a scheduled to go back to the OR on 5/11 per discussion with neurosurgery ? ?Hypokalemia ?AKI ?Continue aggressive electrolyte supplement ?Monitor BMP and serum creatinine ?Serum creatinine has trended  down from 1.5-1.1 ?CKD was ruled out ?Avoid nephrotoxic agents as able ? ?Hypertension ?Home regimen: Norvasc, Lasix, Toprol-XL, valsartan-HCTZ ?Hold home antihypertensives at  present in the setting of hypotension ? ?Best Practice: (right click and "Reselect all SmartList Selections" daily)  ? ?Per Primary Team ? ?Labs:  ?CBC: ?Recent Labs  ?Lab 12/07/21 ?0930 12/03/2021 ?1604 12/12/21 ?0503  ?WBC 12.9* 10.8* 9.8  ?HGB 17.8* 14.6 14.4  ?HCT 51.1 42.9 43.0  ?MCV 85.6 86.3 86.2  ?PLT 379 291 256  ? ?Basic Metabolic Panel: ?Recent Labs  ?Lab 12/07/21 ?0930 12/10/2021 ?1035 12/04/2021 ?1459 12/31/2021 ?1830 12/12/21 ?0503  ?NA 138 138 138  --  140  ?K 3.7 3.2* 3.1*  --  3.3*  ?CL 98 101 102  --  103  ?CO2 '25 26 28  '$ --  28  ?GLUCOSE 114* 136* 154*  --  111*  ?BUN 41* 35* 31*  --  22  ?CREATININE 1.90* 1.35* 1.54*  --  1.12  ?CALCIUM 9.4 9.2 8.7*  --  8.8*  ?MG  --   --   --  1.8 2.0  ?PHOS  --   --   --   --  2.9  ? ?GFR: ?Estimated Creatinine Clearance: 99.4 mL/min (by C-G formula based on SCr of 1.12 mg/dL). ?Recent Labs  ?Lab 12/07/21 ?0930 01/01/2022 ?1604 12/12/21 ?0503  ?WBC 12.9* 10.8* 9.8  ? ?Liver Function Tests: ?Recent Labs  ?Lab 12/19/2021 ?1459 12/12/21 ?0503  ?AST 27 23  ?ALT 30 23  ?ALKPHOS 63 53  ?BILITOT 1.0 1.0  ?PROT 6.5 6.6  ?ALBUMIN 3.3* 3.4*  ? ?No results for input(s): LIPASE, AMYLASE in the last 168 hours. ?No results for input(s): AMMONIA in the last 168 hours. ? ?ABG: ?No results found for: PHART, PCO2ART, PO2ART, HCO3, TCO2, ACIDBASEDEF, O2SAT  ? ?Coagulation Profile: ?No results for input(s): INR, PROTIME in the last 168 hours. ? ?Cardiac Enzymes: ?No results for input(s): CKTOTAL, CKMB, CKMBINDEX, TROPONINI in the last 168 hours. ? ?HbA1C: ?No results found for: HGBA1C ? ?CBG: ?No results for input(s): GLUCAP in the last 168 hours. ? ? ? ?  ?Jacky Kindle MD ?[facility] ?See Amion for pager ?If no response to pager, please call [phone] until 7pm ?After 7pm, Please call E-link [phone] ? ?

## 2021-12-13 ENCOUNTER — Encounter (HOSPITAL_COMMUNITY): Admission: RE | Disposition: E | Payer: Self-pay | Source: Home / Self Care | Attending: Neurological Surgery

## 2021-12-13 ENCOUNTER — Encounter (HOSPITAL_COMMUNITY): Payer: Self-pay | Admitting: Neurological Surgery

## 2021-12-13 ENCOUNTER — Inpatient Hospital Stay (HOSPITAL_COMMUNITY): Payer: BC Managed Care – PPO | Admitting: Anesthesiology

## 2021-12-13 ENCOUNTER — Inpatient Hospital Stay (HOSPITAL_COMMUNITY): Payer: BC Managed Care – PPO

## 2021-12-13 DIAGNOSIS — J9589 Other postprocedural complications and disorders of respiratory system, not elsewhere classified: Secondary | ICD-10-CM

## 2021-12-13 DIAGNOSIS — D334 Benign neoplasm of spinal cord: Secondary | ICD-10-CM | POA: Diagnosis not present

## 2021-12-13 DIAGNOSIS — E876 Hypokalemia: Secondary | ICD-10-CM | POA: Diagnosis not present

## 2021-12-13 DIAGNOSIS — N170 Acute kidney failure with tubular necrosis: Secondary | ICD-10-CM

## 2021-12-13 HISTORY — PX: POSTERIOR CERVICAL LAMINECTOMY: SHX2248

## 2021-12-13 LAB — POCT I-STAT 7, (LYTES, BLD GAS, ICA,H+H)
Acid-base deficit: 6 mmol/L — ABNORMAL HIGH (ref 0.0–2.0)
Bicarbonate: 22.3 mmol/L (ref 20.0–28.0)
Calcium, Ion: 1.19 mmol/L (ref 1.15–1.40)
HCT: 43 % (ref 39.0–52.0)
Hemoglobin: 14.6 g/dL (ref 13.0–17.0)
O2 Saturation: 87 %
Potassium: 3.1 mmol/L — ABNORMAL LOW (ref 3.5–5.1)
Sodium: 140 mmol/L (ref 135–145)
TCO2: 24 mmol/L (ref 22–32)
pCO2 arterial: 56 mmHg — ABNORMAL HIGH (ref 32–48)
pH, Arterial: 7.209 — ABNORMAL LOW (ref 7.35–7.45)
pO2, Arterial: 66 mmHg — ABNORMAL LOW (ref 83–108)

## 2021-12-13 LAB — SURGICAL PCR SCREEN
MRSA, PCR: NEGATIVE
Staphylococcus aureus: NEGATIVE

## 2021-12-13 LAB — GLUCOSE, CAPILLARY: Glucose-Capillary: 117 mg/dL — ABNORMAL HIGH (ref 70–99)

## 2021-12-13 SURGERY — POSTERIOR CERVICAL LAMINECTOMY
Anesthesia: General

## 2021-12-13 MED ORDER — DEXAMETHASONE SODIUM PHOSPHATE 10 MG/ML IJ SOLN
INTRAMUSCULAR | Status: AC
  Filled 2021-12-13: qty 1

## 2021-12-13 MED ORDER — MIDAZOLAM HCL 2 MG/2ML IJ SOLN
INTRAMUSCULAR | Status: AC
  Filled 2021-12-13: qty 2

## 2021-12-13 MED ORDER — DOCUSATE SODIUM 50 MG/5ML PO LIQD
100.0000 mg | Freq: Two times a day (BID) | ORAL | Status: DC
  Administered 2021-12-14 – 2021-12-17 (×5): 100 mg
  Filled 2021-12-13 (×5): qty 10

## 2021-12-13 MED ORDER — LACTATED RINGERS IV SOLN: INTRAVENOUS | Status: DC | PRN

## 2021-12-13 MED ORDER — THROMBIN 5000 UNITS EX SOLR
CUTANEOUS | Status: AC
  Filled 2021-12-13: qty 5000

## 2021-12-13 MED ORDER — ORAL CARE MOUTH RINSE
15.0000 mL | Freq: Once | OROMUCOSAL | Status: AC
  Administered 2021-12-13: 15 mL via OROMUCOSAL

## 2021-12-13 MED ORDER — LACTATED RINGERS IV SOLN: INTRAVENOUS | Status: DC

## 2021-12-13 MED ORDER — FENTANYL BOLUS VIA INFUSION
50.0000 ug | INTRAVENOUS | Status: DC | PRN
  Administered 2021-12-14 – 2021-12-15 (×9): 50 ug via INTRAVENOUS
  Filled 2021-12-13: qty 100

## 2021-12-13 MED ORDER — ROCURONIUM BROMIDE 10 MG/ML (PF) SYRINGE
PREFILLED_SYRINGE | INTRAVENOUS | Status: AC
  Filled 2021-12-13: qty 10

## 2021-12-13 MED ORDER — DEXAMETHASONE SODIUM PHOSPHATE 10 MG/ML IJ SOLN
INTRAMUSCULAR | Status: DC | PRN
  Administered 2021-12-13: 10 mg via INTRAVENOUS

## 2021-12-13 MED ORDER — ROCURONIUM BROMIDE 50 MG/5ML IV SOLN
100.0000 mg | Freq: Once | INTRAVENOUS | Status: AC
  Administered 2021-12-13: 100 mg via INTRAVENOUS
  Filled 2021-12-13: qty 10

## 2021-12-13 MED ORDER — FENTANYL CITRATE PF 50 MCG/ML IJ SOSY: 50.0000 ug | PREFILLED_SYRINGE | Freq: Once | INTRAMUSCULAR | Status: DC

## 2021-12-13 MED ORDER — PROPOFOL 10 MG/ML IV BOLUS
INTRAVENOUS | Status: AC
  Filled 2021-12-13: qty 20

## 2021-12-13 MED ORDER — LIDOCAINE-EPINEPHRINE 1 %-1:100000 IJ SOLN
INTRAMUSCULAR | Status: DC | PRN
Start: 2021-12-13 — End: 2021-12-13
  Administered 2021-12-13: 10 mL

## 2021-12-13 MED ORDER — PROPOFOL 1000 MG/100ML IV EMUL
5.0000 ug/kg/min | INTRAVENOUS | Status: DC
  Administered 2021-12-13: 20 ug/kg/min via INTRAVENOUS
  Administered 2021-12-13: 40 ug/kg/min via INTRAVENOUS
  Administered 2021-12-13: 10 ug/kg/min via INTRAVENOUS
  Administered 2021-12-13: 30 ug/kg/min via INTRAVENOUS
  Administered 2021-12-14: 50 ug/kg/min via INTRAVENOUS
  Administered 2021-12-14: 40 ug/kg/min via INTRAVENOUS
  Administered 2021-12-14 (×3): 50 ug/kg/min via INTRAVENOUS
  Administered 2021-12-14: 30 ug/kg/min via INTRAVENOUS
  Administered 2021-12-14 (×2): 40 ug/kg/min via INTRAVENOUS
  Administered 2021-12-14: 35 ug/kg/min via INTRAVENOUS
  Administered 2021-12-15 (×2): 40 ug/kg/min via INTRAVENOUS
  Administered 2021-12-15: 50 ug/kg/min via INTRAVENOUS
  Filled 2021-12-13 (×2): qty 100
  Filled 2021-12-13: qty 200
  Filled 2021-12-13 (×10): qty 100

## 2021-12-13 MED ORDER — LIDOCAINE-EPINEPHRINE 1 %-1:100000 IJ SOLN
INTRAMUSCULAR | Status: AC
  Filled 2021-12-13: qty 1

## 2021-12-13 MED ORDER — FENTANYL CITRATE (PF) 250 MCG/5ML IJ SOLN
INTRAMUSCULAR | Status: AC
  Filled 2021-12-13: qty 5

## 2021-12-13 MED ORDER — ROCURONIUM BROMIDE 10 MG/ML (PF) SYRINGE
PREFILLED_SYRINGE | INTRAVENOUS | Status: AC
Start: 2021-12-13 — End: 2021-12-14
  Filled 2021-12-13: qty 10

## 2021-12-13 MED ORDER — PROPOFOL 10 MG/ML IV BOLUS
INTRAVENOUS | Status: DC | PRN
Start: 2021-12-13 — End: 2021-12-13
  Administered 2021-12-13: 30 mg via INTRAVENOUS
  Administered 2021-12-13: 40 mg via INTRAVENOUS

## 2021-12-13 MED ORDER — CEFAZOLIN SODIUM 10 G IJ SOLR
INTRAMUSCULAR | Status: DC | PRN
  Administered 2021-12-13: 3 g via INTRAVENOUS

## 2021-12-13 MED ORDER — BACITRACIN ZINC 500 UNIT/GM EX OINT
TOPICAL_OINTMENT | CUTANEOUS | Status: AC
  Filled 2021-12-13: qty 28.35

## 2021-12-13 MED ORDER — 0.9 % SODIUM CHLORIDE (POUR BTL) OPTIME
TOPICAL | Status: DC | PRN
  Administered 2021-12-13: 1000 mL

## 2021-12-13 MED ORDER — THROMBIN 5000 UNITS EX SOLR
OROMUCOSAL | Status: DC | PRN
  Administered 2021-12-13: 5 mL via TOPICAL

## 2021-12-13 MED ORDER — CHLORHEXIDINE GLUCONATE 0.12 % MT SOLN
OROMUCOSAL | Status: AC
  Filled 2021-12-13: qty 15

## 2021-12-13 MED ORDER — PHENYLEPHRINE 80 MCG/ML (10ML) SYRINGE FOR IV PUSH (FOR BLOOD PRESSURE SUPPORT)
PREFILLED_SYRINGE | INTRAVENOUS | Status: DC | PRN
Start: 2021-12-13 — End: 2021-12-13
  Administered 2021-12-13: 240 ug via INTRAVENOUS
  Administered 2021-12-13 (×2): 160 ug via INTRAVENOUS
  Administered 2021-12-13: 80 ug via INTRAVENOUS
  Administered 2021-12-13: 160 ug via INTRAVENOUS
  Administered 2021-12-13: 80 ug via INTRAVENOUS

## 2021-12-13 MED ORDER — PROPOFOL 1000 MG/100ML IV EMUL
INTRAVENOUS | Status: AC
Start: 2021-12-13 — End: 2021-12-13
  Filled 2021-12-13: qty 100

## 2021-12-13 MED ORDER — FENTANYL 2500MCG IN NS 250ML (10MCG/ML) PREMIX INFUSION
50.0000 ug/h | INTRAVENOUS | Status: DC
  Administered 2021-12-13: 50 ug/h via INTRAVENOUS
  Administered 2021-12-14: 100 ug/h via INTRAVENOUS
  Filled 2021-12-13 (×2): qty 250

## 2021-12-13 MED ORDER — PHENYLEPHRINE 80 MCG/ML (10ML) SYRINGE FOR IV PUSH (FOR BLOOD PRESSURE SUPPORT)
PREFILLED_SYRINGE | INTRAVENOUS | Status: AC
  Filled 2021-12-13: qty 10

## 2021-12-13 MED ORDER — PHENYLEPHRINE HCL-NACL 20-0.9 MG/250ML-% IV SOLN
INTRAVENOUS | Status: DC | PRN
Start: 2021-12-13 — End: 2021-12-13
  Administered 2021-12-13: 25 ug/min via INTRAVENOUS

## 2021-12-13 MED ORDER — CEFAZOLIN SODIUM 1 G IJ SOLR
INTRAMUSCULAR | Status: AC
  Filled 2021-12-13: qty 30

## 2021-12-13 MED ORDER — ROCURONIUM BROMIDE 10 MG/ML (PF) SYRINGE
PREFILLED_SYRINGE | INTRAVENOUS | Status: AC
  Filled 2021-12-13: qty 20

## 2021-12-13 MED ORDER — FENTANYL CITRATE (PF) 100 MCG/2ML IJ SOLN
INTRAMUSCULAR | Status: DC | PRN
  Administered 2021-12-13: 50 ug via INTRAVENOUS
  Administered 2021-12-13: 100 ug via INTRAVENOUS
  Administered 2021-12-13 (×2): 50 ug via INTRAVENOUS

## 2021-12-13 MED ORDER — ROCURONIUM BROMIDE 10 MG/ML (PF) SYRINGE
PREFILLED_SYRINGE | INTRAVENOUS | Status: DC | PRN
  Administered 2021-12-13 (×3): 50 mg via INTRAVENOUS

## 2021-12-13 MED ORDER — METHYLPREDNISOLONE ACETATE 80 MG/ML IJ SUSP
INTRAMUSCULAR | Status: AC
  Filled 2021-12-13: qty 1

## 2021-12-13 MED ORDER — PHENYLEPHRINE 80 MCG/ML (10ML) SYRINGE FOR IV PUSH (FOR BLOOD PRESSURE SUPPORT)
PREFILLED_SYRINGE | INTRAVENOUS | Status: AC
  Filled 2021-12-13: qty 20

## 2021-12-13 MED ORDER — ONDANSETRON HCL 4 MG/2ML IJ SOLN
INTRAMUSCULAR | Status: AC
  Filled 2021-12-13: qty 2

## 2021-12-13 MED ORDER — ETOMIDATE 2 MG/ML IV SOLN
10.0000 mg | Freq: Once | INTRAVENOUS | Status: AC
  Administered 2021-12-13: 20 mg via INTRAVENOUS

## 2021-12-13 MED ORDER — POLYETHYLENE GLYCOL 3350 17 G PO PACK
17.0000 g | PACK | Freq: Every day | ORAL | Status: DC
  Administered 2021-12-16 – 2021-12-19 (×4): 17 g
  Filled 2021-12-13 (×4): qty 1

## 2021-12-13 MED ORDER — CHLORHEXIDINE GLUCONATE 0.12 % MT SOLN: 15.0000 mL | Freq: Once | OROMUCOSAL | Status: AC

## 2021-12-13 MED ORDER — ETOMIDATE 2 MG/ML IV SOLN
INTRAVENOUS | Status: AC
  Filled 2021-12-13: qty 10

## 2021-12-13 MED ORDER — ONDANSETRON HCL 4 MG/2ML IJ SOLN
INTRAMUSCULAR | Status: DC | PRN
  Administered 2021-12-13: 4 mg via INTRAVENOUS

## 2021-12-13 MED ORDER — MIDAZOLAM HCL 2 MG/2ML IJ SOLN
INTRAMUSCULAR | Status: DC | PRN
  Administered 2021-12-13: 2 mg via INTRAVENOUS

## 2021-12-13 SURGICAL SUPPLY — 56 items
BAG COUNTER SPONGE SURGICOUNT (BAG) ×3 IMPLANT
BAND RUBBER #18 3X1/16 STRL (MISCELLANEOUS) ×4 IMPLANT
BLADE CLIPPER SURG (BLADE) IMPLANT
BLADE SURG 11 STRL SS (BLADE) ×2 IMPLANT
BUR MATCHSTICK NEURO 3.0 LAGG (BURR) IMPLANT
BUR PRECISION FLUTE 5.0 (BURR) ×1 IMPLANT
CANISTER SUCT 3000ML PPV (MISCELLANEOUS) ×2 IMPLANT
DECANTER SPIKE VIAL GLASS SM (MISCELLANEOUS) ×1 IMPLANT
DERMABOND ADVANCED (GAUZE/BANDAGES/DRESSINGS) ×1
DERMABOND ADVANCED .7 DNX12 (GAUZE/BANDAGES/DRESSINGS) ×1 IMPLANT
DRAPE C-ARM 35X43 STRL (DRAPES) ×2 IMPLANT
DRAPE C-ARM 42X72 X-RAY (DRAPES) ×4 IMPLANT
DRAPE LAPAROTOMY 100X72 PEDS (DRAPES) ×2 IMPLANT
DRAPE MICROSCOPE LEICA (MISCELLANEOUS) ×2 IMPLANT
DRAPE SURG 17X23 STRL (DRAPES) ×2 IMPLANT
DURAPREP 26ML APPLICATOR (WOUND CARE) ×2 IMPLANT
ELECT BLADE 6.5 EXT (BLADE) ×2 IMPLANT
ELECT REM PT RETURN 9FT ADLT (ELECTROSURGICAL) ×2
ELECTRODE REM PT RTRN 9FT ADLT (ELECTROSURGICAL) ×1 IMPLANT
GAUZE 4X4 16PLY ~~LOC~~+RFID DBL (SPONGE) IMPLANT
GAUZE SPONGE 4X4 12PLY STRL (GAUZE/BANDAGES/DRESSINGS) IMPLANT
GLOVE BIOGEL PI IND STRL 7.5 (GLOVE) ×1 IMPLANT
GLOVE BIOGEL PI INDICATOR 7.5 (GLOVE) ×1
GLOVE ECLIPSE 7.5 STRL STRAW (GLOVE) ×2 IMPLANT
GLOVE EXAM NITRILE LRG STRL (GLOVE) IMPLANT
GLOVE EXAM NITRILE XL STR (GLOVE) IMPLANT
GLOVE EXAM NITRILE XS STR PU (GLOVE) IMPLANT
GOWN STRL REUS W/ TWL LRG LVL3 (GOWN DISPOSABLE) ×2 IMPLANT
GOWN STRL REUS W/ TWL XL LVL3 (GOWN DISPOSABLE) IMPLANT
GOWN STRL REUS W/TWL 2XL LVL3 (GOWN DISPOSABLE) IMPLANT
GOWN STRL REUS W/TWL LRG LVL3 (GOWN DISPOSABLE) ×2
GOWN STRL REUS W/TWL XL LVL3 (GOWN DISPOSABLE) ×1
HEMOSTAT POWDER KIT SURGIFOAM (HEMOSTASIS) ×2 IMPLANT
KIT BASIN OR (CUSTOM PROCEDURE TRAY) ×2 IMPLANT
KIT TURNOVER KIT B (KITS) ×2 IMPLANT
NDL HYPO 18GX1.5 BLUNT FILL (NEEDLE) IMPLANT
NDL SPNL 18GX3.5 QUINCKE PK (NEEDLE) IMPLANT
NEEDLE HYPO 18GX1.5 BLUNT FILL (NEEDLE) IMPLANT
NEEDLE HYPO 22GX1.5 SAFETY (NEEDLE) ×2 IMPLANT
NEEDLE SPNL 18GX3.5 QUINCKE PK (NEEDLE) IMPLANT
NS IRRIG 1000ML POUR BTL (IV SOLUTION) ×2 IMPLANT
PACK LAMINECTOMY NEURO (CUSTOM PROCEDURE TRAY) ×2 IMPLANT
PAD ARMBOARD 7.5X6 YLW CONV (MISCELLANEOUS) ×6 IMPLANT
PROBE FOR NEUROSURGERY (MISCELLANEOUS) ×1 IMPLANT
SPONGE T-LAP 4X18 ~~LOC~~+RFID (SPONGE) IMPLANT
SUT MNCRL AB 3-0 PS2 18 (SUTURE) ×1 IMPLANT
SUT MON AB 3-0 SH 27 (SUTURE)
SUT MON AB 3-0 SH27 (SUTURE) ×1 IMPLANT
SUT VIC AB 0 CT1 18XCR BRD8 (SUTURE) ×1 IMPLANT
SUT VIC AB 0 CT1 8-18 (SUTURE) ×1
SUT VIC AB 2-0 CP2 18 (SUTURE) ×2 IMPLANT
SUT VIC AB 4-0 RB1 18 (SUTURE) IMPLANT
SYR 3ML LL SCALE MARK (SYRINGE) IMPLANT
TOWEL GREEN STERILE (TOWEL DISPOSABLE) ×2 IMPLANT
TOWEL GREEN STERILE FF (TOWEL DISPOSABLE) ×2 IMPLANT
WATER STERILE IRR 1000ML POUR (IV SOLUTION) ×2 IMPLANT

## 2021-12-13 NOTE — Procedures (Signed)
Arterial Catheter Insertion Procedure Note ? ?Adam Moreno  ?[id number]  ?[birth year] ? ?Date:12/28/2021  ?Time:1:31 PM  ? ? ?Provider Performing: Jacky Kindle  ? ? ?Procedure: Insertion of Arterial Line [phone]) with US guidance (16010)  ? ?Indication(s) ?Blood pressure monitoring and/or need for frequent ABGs ? ?Consent ?Risks of the procedure as well as the alternatives and risks of each were explained to the patient and/or caregiver.  Consent for the procedure was obtained and is signed in the bedside chart ? ?Anesthesia ?None ? ? ?Time Out ?Verified patient identification, verified procedure, site/side was marked, verified correct patient position, special equipment/implants available, medications/allergies/relevant history reviewed, required imaging and test results available. ? ? ?Sterile Technique ?Maximal sterile technique including full sterile barrier drape, hand hygiene, sterile gown, sterile gloves, mask, hair covering, sterile ultrasound probe cover (if used). ? ? ?Procedure Description ?Area of catheter insertion was cleaned with chlorhexidine and draped in sterile fashion. With real-time ultrasound guidance an arterial catheter was placed into the left  Axillary  artery.  Appropriate arterial tracings confirmed on monitor.   ? ? ?Complications/Tolerance ?None; patient tolerated the procedure well. ? ? ?EBL ?Minimal ? ? ?Specimen(s) ?None ? ? ? ? ?

## 2021-12-13 NOTE — Progress Notes (Signed)
Notified elink of the fact that the aline stopped transducing.  RT attempted to get it working again but was unsuccessful.  Aline removed.  Informed elink of decreased urine output.  If BP becomes soft, will consider fluid bolus.   ?

## 2021-12-13 NOTE — Anesthesia Preprocedure Evaluation (Addendum)
Anesthesia Evaluation  ?Patient identified by MRN, date of birth, ID band ?Patient unresponsive ? ? ? ?Reviewed: ?Patient's Chart, lab work & pertinent test results, Unable to perform ROS - Chart review only ? ?Airway ?Mallampati: Intubated ? ?TM Distance: >3 FB ?Neck ROM: Full ? ? ? Dental ?no notable dental hx. ? ?  ?Pulmonary ?neg pulmonary ROS, sleep apnea ,  ?  ?breath sounds clear to auscultation ?+ decreased breath sounds ? ? ? ? ? Cardiovascular ?hypertension, Pt. on home beta blockers and Pt. on medications ?Normal cardiovascular exam ?Rhythm:Regular Rate:Normal ? ?IMPRESSIONS  ? ? ??1. Left ventricular ejection fraction, by estimation, is 60 to 65%. The  ?left ventricle has normal function. Left ventricular endocardial border  ?not optimally defined to evaluate regional wall motion. Left ventricular  ?diastolic parameters are consistent  ?with Grade I diastolic dysfunction (impaired relaxation).  ??2. Right ventricular systolic function is normal. The right ventricular  ?size is normal.  ??3. The mitral valve is abnormal. Trivial mitral valve regurgitation. No  ?evidence of mitral stenosis.  ??4. The aortic valve is tricuspid. There is mild calcification of the  ?aortic valve. There is mild thickening of the aortic valve. Aortic valve  ?regurgitation is not visualized. Aortic valve sclerosis/calcification is  ?present, without any evidence of  ?aortic stenosis.  ? ?Comparison(s): Compared to prior limited echo report on 07/19/21, there is  ?no significant change.  ?  ?Neuro/Psych ?PSYCHIATRIC DISORDERS Depression negative neurological ROS ? negative psych ROS  ? GI/Hepatic ?negative GI ROS, Neg liver ROS,   ?Endo/Other  ?Morbid obesity ? Renal/GU ?negative Renal ROS  ?negative genitourinary ?  ?Musculoskeletal ?negative musculoskeletal ROS ?(+)  ? Abdominal ?  ?Peds ?negative pediatric ROS ?(+)  Hematology ?negative hematology ROS ?(+)   ?Anesthesia Other Findings ? ?  Reproductive/Obstetrics ?negative OB ROS ? ?  ? ? ? ? ? ? ? ? ? ? ? ? ? ?  ?  ? ? ? ? ? ? ? ?Anesthesia Physical ?Anesthesia Plan ? ?ASA: 4 ? ?Anesthesia Plan: General  ? ?Post-op Pain Management:   ? ?Induction: Intravenous ? ?PONV Risk Score and Plan: 4 or greater and Ondansetron, Dexamethasone and Midazolam ? ?Airway Management Planned: Oral ETT ? ?Additional Equipment: Arterial line ? ?Intra-op Plan:  ? ?Post-operative Plan: Post-operative intubation/ventilation ? ?Informed Consent: I have reviewed the patients History and Physical, chart, labs and discussed the procedure including the risks, benefits and alternatives for the proposed anesthesia with the patient or authorized representative who has indicated his/her understanding and acceptance.  ? ? ? ?Dental advisory given ? ?Plan Discussed with: CRNA and Surgeon ? ?Anesthesia Plan Comments:   ? ? ? ? ?Anesthesia Quick Evaluation ? ?

## 2021-12-13 NOTE — Procedures (Signed)
Intubation Procedure Note ? ?Ahmadou Bolz  ?[id number]  ?Apr 24, 1956 ? ?Date:12/19/2021  ?Time:1:29 PM  ? ?Provider Performing:Gaspare Netzel  ? ? ?Procedure: Intubation (40086) ? ?Indication(s) ?Respiratory Failure ? ?Consent ?Risks of the procedure as well as the alternatives and risks of each were explained to the patient and/or caregiver.  Consent for the procedure was obtained and is signed in the bedside chart ? ? ?Anesthesia ?Etomidate and Rocuronium ? ? ?Time Out ?Verified patient identification, verified procedure, site/side was marked, verified correct patient position, special equipment/implants available, medications/allergies/relevant history reviewed, required imaging and test results available. ? ? ?Sterile Technique ?Usual hand hygeine, masks, and gloves were used ? ? ?Procedure Description ?Patient positioned in bed supine.  Sedation given as noted above.  Patient was intubated with endotracheal tube using  MAC 4 .  View was Grade 2 only posterior commissure .  Number of attempts was 1.  Colorimetric CO2 detector was consistent with tracheal placement. ? ? ?Complications/Tolerance ?None; patient tolerated the procedure well. ?Chest X-ray is ordered to verify placement. ? ? ?EBL ?Minimal ? ? ?Specimen(s) ?None ? ?

## 2021-12-13 NOTE — Progress Notes (Signed)
Neurosurgery Service ?Progress Note ? ?Subjective: No acute events overnight, doing well again today, no complaints ? ?Objective: ?Vitals:  ? 12/07/2021 0100 01/01/2022 0200 12/19/2021 0600 12/26/2021 0700  ?BP: 129/69 108/69 (!) 149/88 (!) 153/93  ?Pulse: 71 70  68  ?Resp: (!) '25 15  15  '$ ?Temp:      ?TempSrc:      ?SpO2: 94% 93%  96%  ?Weight:      ?Height:      ? ? ?Physical Exam: ?Aox3, PERRL, EOMI, FS & SS, tongue midline with full ROM and bulk, palate symmetric, strength 5/5x4, SILTx4 ? ?Assessment & Plan: ?66 y.o. 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  ? ?-OR today for lami / resection, going to be intubated in the ICU preop, will need a new A-line for the OR ?-NPO for OR ? ?Adam Moreno  ?12/16/2021 ?7:42 AM ? ?

## 2021-12-13 NOTE — Transfer of Care (Signed)
Immediate Anesthesia Transfer of Care Note ? ?Patient: Adam Moreno ? ?Procedure(s) Performed: POSTERIOR CERVICAL LAMINECTOMY CERVICAL ONE, CERVICAL TWO For Tumor Resection ? ?Patient Location: ICU ? ?Anesthesia Type:General ? ?Level of Consciousness: Patient remains intubated per anesthesia plan ? ?Airway & Oxygen Therapy: Patient remains intubated per anesthesia plan and Patient placed on Ventilator (see vital sign flow sheet for setting) ? ?Post-op Assessment: Report given to RN ? ?Post vital signs: Reviewed and stable ? ?Last Vitals:  ?Vitals Value Taken Time  ?BP 133/70   ?Temp    ?Pulse 91 12/24/2021 1751  ?Resp 17 12/16/2021 1751  ?SpO2 99 % 12/04/2021 1751  ?Vitals shown include unvalidated device data. ? ?Last Pain:  ?Vitals:  ? 12/28/2021 0800  ?TempSrc:   ?PainSc: 0-No pain  ?   ? ?  ? ?Complications: No notable events documented. ?

## 2021-12-13 NOTE — Progress Notes (Signed)
? ?NAME:  Adam Moreno, MRN:  [id number], DOB:  Sep 10, 1955, LOS: 2 ?ADMISSION DATE:  12/10/2021 CONSULTATION DATE:  12/12/2021 ?REFERRING MD:  Zada Finders - NSGY CHIEF COMPLAINT:  Hypotension  ? ?History of Present Illness:  ?66 year old man who presented to [facility] 5/9 for planned laminectomy/surgical resection of C1-C2 nerve sheath tumor with NSGY (Dr. Zada Finders). 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 ? ?Patient presented for scheduled laminectomy/nerve sheath tumor resection 5/9. Post-induction/intubation prior to the procedure, patient became hypotensive and it was unclear if he had a pulse (true pulselessness versus instrumentation). Epi was administered x 1 and a short course of CPR was completed with return of palpable pulse. Case was aborted. Patient was successfully extubated in the OR with appropriate mental status. Placed on Neosynephrine for BP support. Patient was transferred to ICU for further workup. ? ?PCCM consulted for assistance with hypotension workup. ? ?Pertinent Medical History:  ? ?Past Medical History:  ?Diagnosis Date  ? Depression   ? Hypertension   ? Sleep apnea   ? doesn't wear CPAP  ? ?Significant Hospital Events: ?Including procedures, antibiotic start and stop dates in addition to other pertinent events   ?5/9 - Presented to [facility] for planned C1-C2 nerve sheath tumor resection/laminectomy. Hypotensive shortly after induction/intubation requiring case to be aborted. Extubated in OR. Transferred to ICU for further cardiac workup. ? ?Interim History / Subjective:  ?No overnight issues ?Patient refused CPAP again ?He was intubated in ICU as he went into cardiac arrest 2 days ago during induction in OR ? ?Objective:  ?Blood pressure (!) 142/84, pulse 87, temperature 98.8 ?F (37.1 ?C), resp. rate 16, height 6' (1.829 m), weight (!) 147.4 kg, SpO2 98 %. ?   ?Vent Mode: PRVC ?FiO2 (%):  [100 %] 100 % ?Set  Rate:  [18 bmp] 18 bmp ?Vt Set:  [620 mL] 620 mL ?PEEP:  [5 cmH20] 5 cmH20 ?Plateau Pressure:  [14 cmH20] 14 cmH20  ? ?Intake/Output Summary (Last 24 hours) at 12/17/2021 1338 ?Last data filed at 12/19/2021 1000 ?[place] per 24 hour  ?Intake 134.12 ml  ?Output 425 ml  ?Net -290.88 ml  ? ?Filed Weights  ? 12/30/2021 0950  ?Weight: (!) 147.4 kg  ? ?Physical Examination: ?  ?Physical exam: ?General: Acutely ill-appearing morbidly obese male, orally intubated ?HEENT: [place]/AT, eyes anicteric.  ETT and OGT in place ?Neuro: Sedated, not following commands.  Eyes are closed.  Pupils 3 mm bilateral reactive to light ?Chest: Coarse breath sounds, no wheezes or rhonchi ?Heart: Regular rate and rhythm, no murmurs or gallops ?Abdomen: Soft, nontender, nondistended, bowel sounds present ?Skin: No rash ? ? ?Resolved Hospital Problem List:  ? ? ?Assessment & Plan:  ?Medication related Hypotension anesthesia induction/intubation ?Question of PEA cardiac arrest after anesthesia induction ?Patient is actually hypotensive now ?Remain off vasopressors ?Echocardiogram is consistent with grade 1 diastolic dysfunction ?Closely monitor blood pressure ? ?Acute respiratory insufficiency, peri-procedure ?Patient was intubated in ICU for cervical spine tumor resection today ?Continue lung protective ventilation ?Peak and plateau pressure at goal ?Trend ABGs ?Postprocedure we will try to extubate as soon as we can ? ?Morbid obesity ?OSA, noncompliant with CPAP ?Patient refused CPAP overnight ?Encouraged to use CPAP every night to prevent further complications and pulmonary hypertension ? ?Schwannoma of C1-C2 nerve sheath ?Patient was scheduled for resection of tumor today ?Defer management to neurosurgery ? ?Hypokalemia ?AKI ?Continue aggressive electrolyte supplement ?Monitor BMP and serum creatinine ?Serum creatinine has trended  down ?CKD was ruled out ?Avoid nephrotoxic agents as able ? ?Hypertension ?Home regimen: Norvasc, Lasix, Toprol-XL,  valsartan-HCTZ ?Resume oral antihypertensive after surgery ? ?Best Practice: (right click and "Reselect all SmartList Selections" daily)  ? ?Per Primary Team ? ?Labs:  ?CBC: ?Recent Labs  ?Lab 12/07/21 ?0930 01/02/2022 ?1604 12/12/21 ?0503  ?WBC 12.9* 10.8* 9.8  ?HGB 17.8* 14.6 14.4  ?HCT 51.1 42.9 43.0  ?MCV 85.6 86.3 86.2  ?PLT 379 291 256  ? ?Basic Metabolic Panel: ?Recent Labs  ?Lab 12/07/21 ?0930 12/07/2021 ?1035 12/06/2021 ?1459 12/14/2021 ?1830 12/12/21 ?0503  ?NA 138 138 138  --  140  ?K 3.7 3.2* 3.1*  --  3.3*  ?CL 98 101 102  --  103  ?CO2 '25 26 28  '$ --  28  ?GLUCOSE 114* 136* 154*  --  111*  ?BUN 41* 35* 31*  --  22  ?CREATININE 1.90* 1.35* 1.54*  --  1.12  ?CALCIUM 9.4 9.2 8.7*  --  8.8*  ?MG  --   --   --  1.8 2.0  ?PHOS  --   --   --   --  2.9  ? ?GFR: ?Estimated Creatinine Clearance: 99.4 mL/min (by C-G formula based on SCr of 1.12 mg/dL). ?Recent Labs  ?Lab 12/07/21 ?0930 12/19/2021 ?1604 12/12/21 ?0503  ?WBC 12.9* 10.8* 9.8  ? ?Liver Function Tests: ?Recent Labs  ?Lab 12/24/2021 ?1459 12/12/21 ?0503  ?AST 27 23  ?ALT 30 23  ?ALKPHOS 63 53  ?BILITOT 1.0 1.0  ?PROT 6.5 6.6  ?ALBUMIN 3.3* 3.4*  ? ?No results for input(s): LIPASE, AMYLASE in the last 168 hours. ?No results for input(s): AMMONIA in the last 168 hours. ? ?ABG: ?No results found for: PHART, PCO2ART, PO2ART, HCO3, TCO2, ACIDBASEDEF, O2SAT  ? ?Coagulation Profile: ?No results for input(s): INR, PROTIME in the last 168 hours. ? ?Cardiac Enzymes: ?No results for input(s): CKTOTAL, CKMB, CKMBINDEX, TROPONINI in the last 168 hours. ? ?HbA1C: ?No results found for: HGBA1C ? ?CBG: ?Recent Labs  ?Lab 01/02/2022 ?0726  ?GLUCAP 117*  ? ? ? ? ?  ?Total critical care time: 32 minutes ? ?Performed by: Jacky Kindle ?  ?Critical care time was exclusive of separately billable procedures and treating other patients. ?  ?Critical care was necessary to treat or prevent imminent or life-threatening deterioration. ?  ?Critical care was time spent personally by me on the  following activities: development of treatment plan with patient and/or surrogate as well as nursing, discussions with consultants, evaluation of patient's response to treatment, examination of patient, obtaining history from patient or surrogate, ordering and performing treatments and interventions, ordering and review of laboratory studies, ordering and review of radiographic studies, pulse oximetry and re-evaluation of patient's condition. ?  ?Jacky Kindle MD ?[facility] ?See Amion for pager ?If no response to pager, please call [phone] until 7pm ?After 7pm, Please call E-link [phone] ? ? ?

## 2021-12-13 NOTE — Progress Notes (Signed)
Neurosurgery Service ?Post-operative progress note ? ?Assessment & Plan: ?66 y.o. man s/p C1-2 laminectomies for tumor resection, prelim schwannoma, per anesthesia returning to ICU intubated for extubation in the ICU. ? ?-activity as tolerated, no C-collar needed, no restrictions regarding neck position if he requires reintubation, okay to wear CPAP/BiPAP mask as needed ?-advance diet as tolerated ?-MRI C-spine w/wo contrast in the next 48h, no rush ? ?Adam Moreno  ?12/03/2021 ?5:41 PM ? ? ?

## 2021-12-13 NOTE — Op Note (Signed)
PATIENT: Adam Moreno ? ?DAY OF SURGERY: 12/03/2021 ?  ?PRE-OPERATIVE DIAGNOSIS:  Cervical intraspinal tumor ?  ?POST-OPERATIVE DIAGNOSIS:  Same ?  ?PROCEDURE:  C1, C2 laminectomies and resection of intraspinal tumor, use of operating room microscope ?  ?SURGEON:  Surgeon(s) and Role: ?   Judith Part, MD - Primary ?  ?ANESTHESIA: ETGA ?  ?BRIEF HISTORY: This is a 66 year old man who presented with intermittent dysarthria and dysphagia. 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 Given the cord compression and lack of alternative explanation for his symptoms, I recommended surgical intervention. We originally attempted this two days prior today, but on induction anesthesia was unable to follow his BP with cuff and he had very weak pulses concerning for hypotension. He was resuscitated and recovered with a negative workup for underlying cardiac issue. He was therefore intubated in the ICU with an arterial line prior to surgery today without issue and now presents for resection. I discussed the above with the patient including risks, benefits, and alternatives and he wished to proceed with surgery. ?  ?OPERATIVE DETAIL: The patient was taken to the operating room, anesthesia was continued by the anesthesia team, the [place] head holder was applied, and the patient was placed on the OR table in the prone position. The [place] was secured and a formal time out was performed with two patient identifiers and confirmed the operative site. The operative site was marked, hair was clipped with surgical clippers, the area was then prepped and draped in a sterile fashion.  ? ?Fluoroscopy was brought in to help localize the C1 and C2 spinous processes. A linear incision was then placed in the midline to expose C1 and C2 in a subperiosteal fashion. I suspected a laminar defect and indeed saw a defect on the right in part of C1 and most of C2. A  laminectomy was performed on the right at C1 and C2. A piece of the tumor was taken and sent for frozen path. 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 The tumor was then centrally debulked and dissected off of the cord medially. I used the handheld doppler to locate the vert ventrally and followed it back to the foramen where it was superior to the mass. The tumor was then further internally debulked laterally and carefully dissected off of the vertebral artery. As expected given the pathology, the C2 fascicles were clearly seen tracing through the tumor and the tumor was finally resected at both of these poles to complete removal. No Shevlin tumor was visible remaining. ? ?The wound was copiously irrigated, all instrument and sponge counts were correct, and the incision was then closed in layers. The patient was then returned to anesthesia. No apparent complications at the completion of the procedure. ?  ?EBL:  275m ?  ?DRAINS: none ?  ?SPECIMENS: C2 mass ?  ?TJudith Part MD ?12/27/2021 ?2:45 PM ? ?

## 2021-12-13 NOTE — Progress Notes (Signed)
?  Transition of Care (TOC) Screening Note ? ? ?Patient Details  ?Name: Adam Moreno ?Date of Birth: [birth year] ? ? ?Transition of Care [facility]) CM/SW Contact:    ?Ella Bodo, RN ?Phone Number: ?12/25/2021, 5:11 PM ? ? ? ?Transition of Care Department [facility]) has reviewed patient and no TOC needs have been identified at this time. We will continue to monitor patient advancement through interdisciplinary progression rounds. If new patient transition needs arise, please place a TOC consult. ? ?Reinaldo Raddle, RN, BSN  ?Trauma/Neuro ICU Case Manager ?[phone] ? ?

## 2021-12-14 ENCOUNTER — Inpatient Hospital Stay (HOSPITAL_COMMUNITY): Payer: BC Managed Care – PPO

## 2021-12-14 ENCOUNTER — Encounter (HOSPITAL_COMMUNITY): Payer: Self-pay | Admitting: Neurological Surgery

## 2021-12-14 DIAGNOSIS — I4891 Unspecified atrial fibrillation: Secondary | ICD-10-CM | POA: Diagnosis not present

## 2021-12-14 DIAGNOSIS — J9601 Acute respiratory failure with hypoxia: Secondary | ICD-10-CM | POA: Diagnosis not present

## 2021-12-14 DIAGNOSIS — D334 Benign neoplasm of spinal cord: Secondary | ICD-10-CM | POA: Diagnosis not present

## 2021-12-14 LAB — CBC
HCT: 39.2 % (ref 39.0–52.0)
Hemoglobin: 13.4 g/dL (ref 13.0–17.0)
MCH: 29.5 pg (ref 26.0–34.0)
MCHC: 34.2 g/dL (ref 30.0–36.0)
MCV: 86.3 fL (ref 80.0–100.0)
Platelets: 250 10*3/uL (ref 150–400)
RBC: 4.54 MIL/uL (ref 4.22–5.81)
RDW: 13.2 % (ref 11.5–15.5)
WBC: 14 10*3/uL — ABNORMAL HIGH (ref 4.0–10.5)
nRBC: 0 % (ref 0.0–0.2)

## 2021-12-14 LAB — BASIC METABOLIC PANEL
Anion gap: 7 (ref 5–15)
BUN: 20 mg/dL (ref 8–23)
CO2: 27 mmol/L (ref 22–32)
Calcium: 8.7 mg/dL — ABNORMAL LOW (ref 8.9–10.3)
Chloride: 104 mmol/L (ref 98–111)
Creatinine, Ser: 1.33 mg/dL — ABNORMAL HIGH (ref 0.61–1.24)
GFR, Estimated: 60 mL/min — ABNORMAL LOW (ref >60)
Glucose, Bld: 144 mg/dL — ABNORMAL HIGH (ref 70–99)
Potassium: 3.8 mmol/L (ref 3.5–5.1)
Sodium: 138 mmol/L (ref 135–145)

## 2021-12-14 LAB — GLUCOSE, CAPILLARY
Glucose-Capillary: 168 mg/dL — ABNORMAL HIGH (ref 70–99)
Glucose-Capillary: 130 mg/dL — ABNORMAL HIGH (ref 70–99)
Glucose-Capillary: 101 mg/dL — ABNORMAL HIGH (ref 70–99)
Glucose-Capillary: 126 mg/dL — ABNORMAL HIGH (ref 70–99)

## 2021-12-14 LAB — MAGNESIUM
Magnesium: 1.7 mg/dL (ref 1.7–2.4)
Magnesium: 2.3 mg/dL (ref 1.7–2.4)

## 2021-12-14 LAB — TRIGLYCERIDES: Triglycerides: 95 mg/dL (ref <150)

## 2021-12-14 LAB — PHOSPHORUS: Phosphorus: 2.5 mg/dL (ref 2.5–4.6)

## 2021-12-14 IMAGING — DX DG ABD PORTABLE 1V
1 series · 1 of 1 positions shown · non-contrast
Comparison: None Available.

CLINICAL DATA: OG tube placement

EXAM:
PORTABLE ABDOMEN - 1 VIEW

[abdomen]
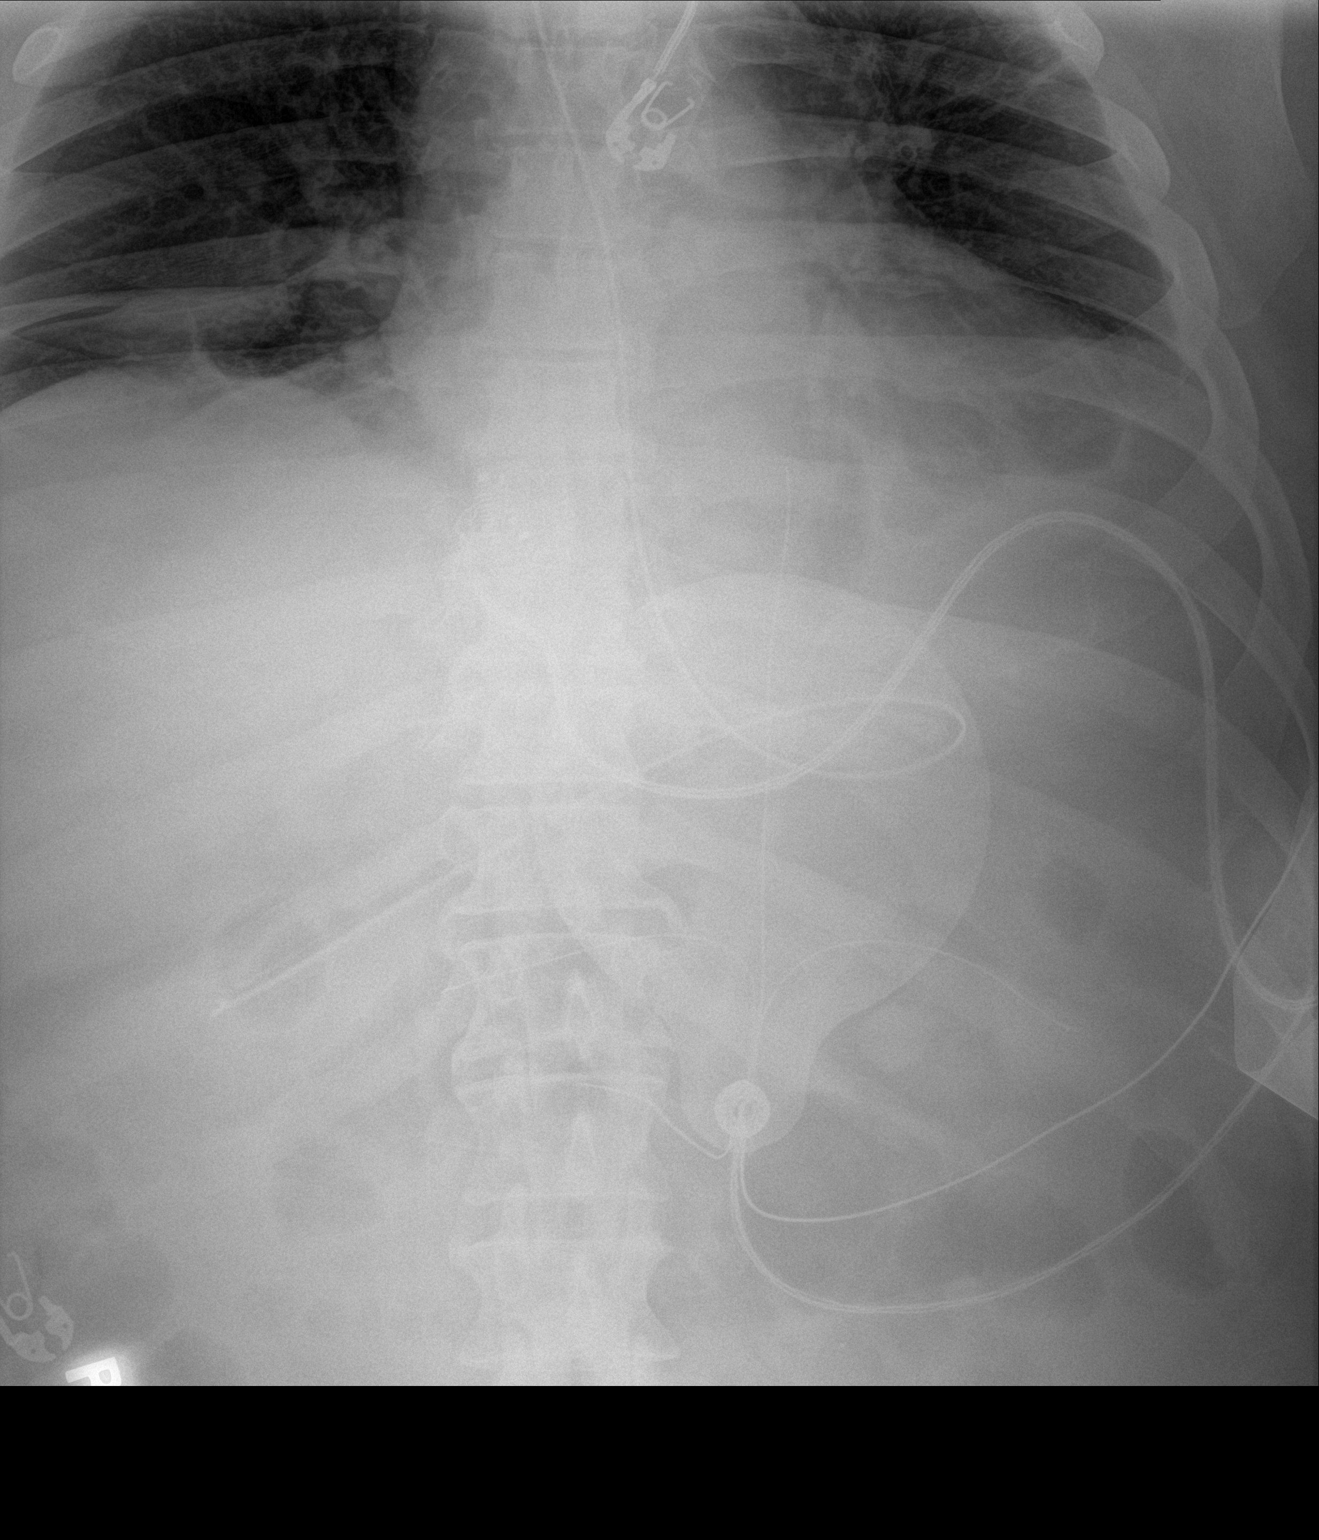

[1 of 1 positions shown; findings below may reference images not displayed]

FINDINGS: Orogastric tube side port overlies the distal stomach, tip overlying
the region of the gastric antrum/proximal duodenum. Defibrillator
pads noted. Nonobstructive bowel gas pattern. Bibasilar airspace
opacities, see separately dictated chest radiograph.
IMPRESSION: Orogastric tube side port overlies the distal stomach, tip overlying
the region of the gastric antrum/proximal duodenum.

## 2021-12-14 IMAGING — DX DG CHEST 1V PORT
1 series · 1 of 1 positions shown · non-contrast
Comparison: Radiograph [DATE]

CLINICAL DATA: ETT placement

EXAM:
PORTABLE CHEST 1 VIEW

[chest]
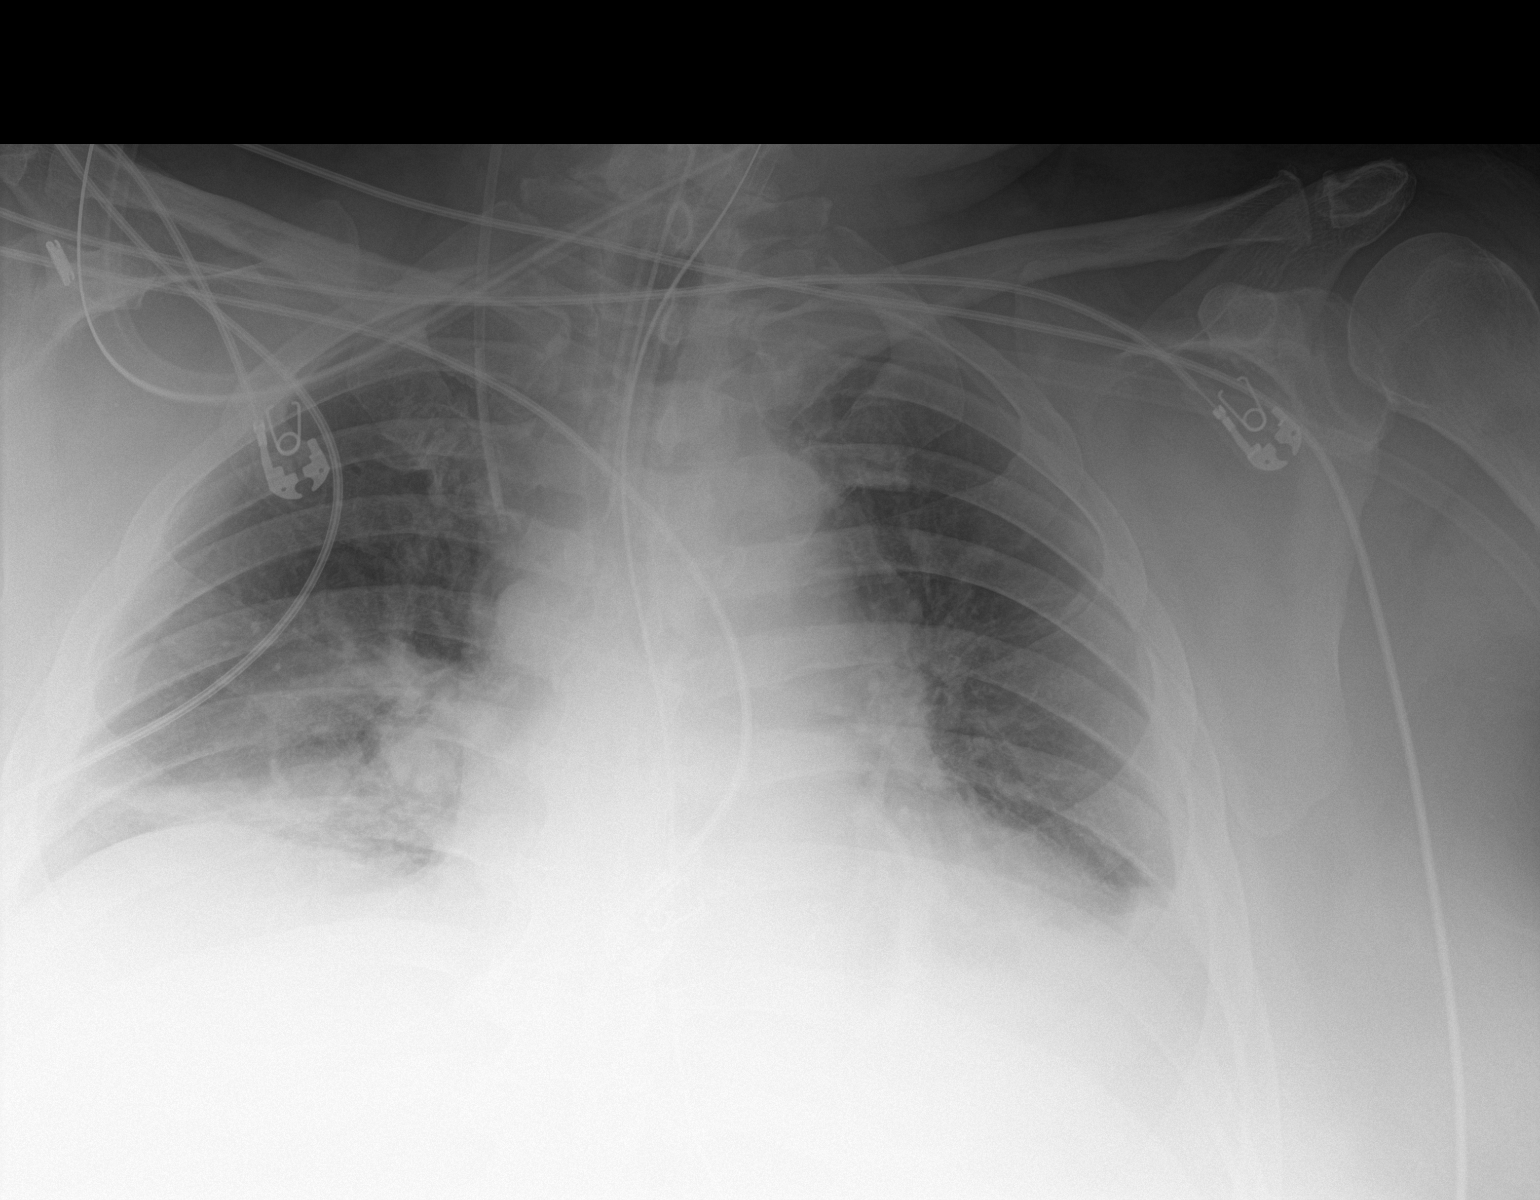

[1 of 1 positions shown; findings below may reference images not displayed]

FINDINGS: Endotracheal tube overlies the midthoracic trachea, 3.4 cm above the
carina. Right neck catheter tip overlies the mid superior vena cava.
Orogastric tube passes below the diaphragm, tip excluded by
collimation. Unchanged cardiomediastinal silhouette. There are
bibasilar airspace opacities, right greater than left. There is a
small left pleural effusion. Low lung volumes. No visible
pneumothorax. No acute osseous abnormality.
IMPRESSION: Endotracheal tube tip overlies the midthoracic trachea.

Right neck approach catheter tip overlies the mid SVC.

Low lung volumes with bibasilar airspace disease, right greater than
left, could be atelectasis or infection. Small left pleural
effusion.

## 2021-12-14 MED ORDER — POTASSIUM CHLORIDE 20 MEQ PO PACK
40.0000 meq | PACK | Freq: Once | ORAL | Status: AC
  Administered 2021-12-14: 40 meq
  Filled 2021-12-14: qty 2

## 2021-12-14 MED ORDER — ETOMIDATE 2 MG/ML IV SOLN
INTRAVENOUS | Status: AC
  Administered 2021-12-14: 20 mg via INTRAVENOUS
  Filled 2021-12-14: qty 20

## 2021-12-14 MED ORDER — AMIODARONE HCL IN DEXTROSE 360-4.14 MG/200ML-% IV SOLN
30.0000 mg/h | INTRAVENOUS | Status: DC
  Administered 2021-12-14 – 2021-12-17 (×5): 30 mg/h via INTRAVENOUS
  Filled 2021-12-14 (×5): qty 200

## 2021-12-14 MED ORDER — MIDAZOLAM HCL 2 MG/2ML IJ SOLN
INTRAMUSCULAR | Status: AC
  Filled 2021-12-14: qty 2

## 2021-12-14 MED ORDER — LACTATED RINGERS IV BOLUS
1000.0000 mL | Freq: Once | INTRAVENOUS | Status: AC
  Administered 2021-12-14: 1000 mL via INTRAVENOUS

## 2021-12-14 MED ORDER — FENTANYL CITRATE PF 50 MCG/ML IJ SOSY
PREFILLED_SYRINGE | INTRAMUSCULAR | Status: AC
  Filled 2021-12-14: qty 2

## 2021-12-14 MED ORDER — SODIUM CHLORIDE 0.9 % IV SOLN
3.0000 g | Freq: Four times a day (QID) | INTRAVENOUS | Status: DC
  Administered 2021-12-14 – 2021-12-17 (×12): 3 g via INTRAVENOUS
  Filled 2021-12-14 (×12): qty 8

## 2021-12-14 MED ORDER — MAGNESIUM SULFATE 2 GM/50ML IV SOLN
2.0000 g | Freq: Once | INTRAVENOUS | Status: AC
  Administered 2021-12-14: 2 g via INTRAVENOUS
  Filled 2021-12-14: qty 50

## 2021-12-14 MED ORDER — ROCURONIUM BROMIDE 10 MG/ML (PF) SYRINGE
100.0000 mg | PREFILLED_SYRINGE | Freq: Once | INTRAVENOUS | Status: AC
  Administered 2021-12-14: 100 mg via INTRAVENOUS

## 2021-12-14 MED ORDER — AMIODARONE HCL IN DEXTROSE 360-4.14 MG/200ML-% IV SOLN
60.0000 mg/h | INTRAVENOUS | Status: AC
  Administered 2021-12-14: 60 mg/h via INTRAVENOUS
  Filled 2021-12-14: qty 200

## 2021-12-14 MED ORDER — PROSOURCE TF PO LIQD
45.0000 mL | Freq: Three times a day (TID) | ORAL | Status: DC
  Administered 2021-12-14 – 2021-12-20 (×15): 45 mL
  Filled 2021-12-14 (×14): qty 45

## 2021-12-14 MED ORDER — ORAL CARE MOUTH RINSE
15.0000 mL | OROMUCOSAL | Status: DC
  Administered 2021-12-14 – 2021-12-15 (×12): 15 mL via OROMUCOSAL

## 2021-12-14 MED ORDER — CHLORHEXIDINE GLUCONATE 0.12% ORAL RINSE (MEDLINE KIT)
15.0000 mL | Freq: Two times a day (BID) | OROMUCOSAL | Status: DC
  Administered 2021-12-14 – 2021-12-15 (×3): 15 mL via OROMUCOSAL

## 2021-12-14 MED ORDER — VITAL 1.5 CAL PO LIQD
1000.0000 mL | ORAL | Status: DC
  Administered 2021-12-14 – 2021-12-17 (×2): 1000 mL

## 2021-12-14 MED ORDER — FUROSEMIDE 10 MG/ML IJ SOLN
40.0000 mg | Freq: Once | INTRAMUSCULAR | Status: AC
  Administered 2021-12-14: 40 mg via INTRAVENOUS
  Filled 2021-12-14: qty 4

## 2021-12-14 MED ORDER — PANTOPRAZOLE 2 MG/ML SUSPENSION
40.0000 mg | Freq: Every day | ORAL | Status: DC
Start: 2021-12-14 — End: 2021-12-20
  Administered 2021-12-14 – 2021-12-19 (×6): 40 mg
  Filled 2021-12-14 (×6): qty 20

## 2021-12-14 MED ORDER — AMIODARONE HCL IN DEXTROSE 360-4.14 MG/200ML-% IV SOLN
INTRAVENOUS | Status: AC
Start: 2021-12-14 — End: 2021-12-14
  Administered 2021-12-14: 150 mg/h
  Filled 2021-12-14: qty 200

## 2021-12-14 MED ORDER — AMIODARONE LOAD VIA INFUSION
150.0000 mg | Freq: Once | INTRAVENOUS | Status: AC
  Filled 2021-12-14: qty 83.34

## 2021-12-14 NOTE — Progress Notes (Signed)
Pharmacy Antibiotic Note ? ?Adam Moreno is a 66 y.o. male admitted on 12/30/2021 with  aspiration PNA .  Pharmacy has been consulted for Unasyn dosing. SCr 1.33 stable. ? ?Plan: ?Unasyn 3g IV q6h ?Monitor clinical progress, c/s, renal function ?F/u de-escalation plan/LOT ? ? ?Height: 6' (182.9 cm) ?Weight: (!) 149.6 kg (329 lb 12.9 oz) ?IBW/kg (Calculated) : 77.6 ? ?Temp (24hrs), Avg:98.1 ?F (36.7 ?C), Min:97.7 ?F (36.5 ?C), Max:98.6 ?F (37 ?C) ? ?Recent Labs  ?Lab 12/26/2021 ?1035 12/10/2021 ?1459 12/30/2021 ?1604 12/12/21 ?0503 12/14/21 ?0459  ?WBC  --   --  10.8* 9.8 14.0*  ?CREATININE 1.35* 1.54*  --  1.12 1.33*  ?  ?Estimated Creatinine Clearance: 84.4 mL/min (A) (by C-G formula based on SCr of 1.33 mg/dL (H)).   ? ?No Known Allergies ? ? ?[name], PharmD, BCPS ?Please check AMION for all [place] contact numbers ?Clinical Pharmacist ?12/14/2021 12:44 PM ?

## 2021-12-14 NOTE — Procedures (Signed)
Bronchoscopy Procedure Note ? ?Ballard Budney  ?[id number]  ?[birth year] ? ?Date:12/14/21  ?Time:12:53 PM  ? ?Provider Performing:Mclane Arora  ? ?Procedure(s):  Flexible Bronchoscopy (70962) ? ?Indication(s) ?Acute hypoxic respiratory failure, sudden choking ? ?Consent ?Unable to obtain consent due to emergent nature of procedure. ? ?Anesthesia ?Etomidate and rocuronium ? ? ?Time Out ?Verified patient identification, verified procedure, site/side was marked, verified correct patient position, special equipment/implants available, medications/allergies/relevant history reviewed, required imaging and test results available. ? ? ?Sterile Technique ?Usual hand hygiene, masks, gowns, and gloves were used ? ? ?Procedure Description ?Bronchoscope advanced through endotracheal tube and into airway.  Airways were examined down to subsegmental level with findings noted below.   ? ?Findings: Small amount of thin secretions noted but otherwise airway looked clean ? ? ?Complications/Tolerance ?None; patient tolerated the procedure well. ?Chest X-ray is needed post procedure. ? ? ?EBL ?Minimal ? ? ?Specimen(s) ? ? ?

## 2021-12-14 NOTE — Progress Notes (Signed)
Neurosurgery Service ?Progress Note ? ?Subjective: No acute events overnight ? ?Objective: ?Vitals:  ? 12/14/21 0600 12/14/21 0700 12/14/21 0800 12/14/21 0824  ?BP: 104/65 115/69 (!) 147/86 (!) 147/86  ?Pulse: 65 (!) 57 63 64  ?Resp: '18 18 18 '$ (!) 22  ?Temp:   97.7 ?F (36.5 ?C)   ?TempSrc:   Axillary   ?SpO2: 96% 96% 98%   ?Weight:      ?Height:      ? ? ?Physical Exam: ?Intubated, sedated, but reliably follows commands x4 with full strength, incision c/d/i ? ?Assessment & Plan: ?66 y.o. 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 5/11 s/p uneventful C1/C2 laminectomies and resection of tumor, prelim path schwannoma ? ?-f/u CCM recs, MRI is pending but just needs to be within 48h of surgery, can get it tomorrow if he needs to extubate to BiPAP/CPAP today or respiratory status isn't good enough for transport ?-hold SQH until 5/13 ? ?Adam Moreno  ?12/14/21 ?8:46 AM ? ?

## 2021-12-14 NOTE — Progress Notes (Signed)
Initial Nutrition Assessment ? ?DOCUMENTATION CODES:  ? ?Not applicable ? ?INTERVENTION:  ? ?Initiate tube feeds via OG tube: ?- Vital 1.5 @ 50 ml/hr (1200 ml/day) ?- ProSource TF 45 ml TID ? ?Tube feeding regimen provides 1920 kcal, 114 grams of protein, and 917 ml of H2O.  ? ?NUTRITION DIAGNOSIS:  ? ?Inadequate oral intake related to inability to eat as evidenced by NPO status. ? ?GOAL:  ? ?Patient will meet greater than or equal to 90% of their needs ? ?MONITOR:  ? ?Vent status, Labs, Weight trends, TF tolerance, I & O's ? ?REASON FOR ASSESSMENT:  ? ?Ventilator, Consult ?Enteral/tube feeding initiation and management ? ?ASSESSMENT:  ? ?66 year old male who presented on 5/09 for planned laminectomy/surgical resection of C1-C2 nerve sheath tumor. PMH of intermittent dysarthria, dysphagia, large mass consistent with a nerve sheath tumor, OSA noncompliant with CPAP, CKD stage IIIa, HTN, depression. ? ?05/09 - intubated for surgery, became hypotensive, required CPR, extubated in PACU, case aborted, regular diet order ?05/11 - NPO, intubated, s/p C1 and C2 laminectomy and resection of intraspinal tumor ?05/12 - extubated, emergently re-intubated shortly after ? ?Consult received for enteral nutrition initiation and management. Pt with OG tube in distal stomach per abdominal x-ray. ? ?No family present at time of RD visit. Unable to obtain diet and weight history at this time. Reviewed weight history in chart. Pt with a weight loss of 7.8 kg since 08/08/21. This is a 5% weight loss in 4 months which is not clinically significant for timeframe. Noted pt experienced dysphagia at times PTA. Question whether weight loss may be related in part to dysphagia. ? ?Patient is currently intubated on ventilator support ?MV: 10.3 L/min ?Temp (24hrs), Avg:98.1 ?F (36.7 ?C), Min:97.7 ?F (36.5 ?C), Max:98.6 ?F (37 ?C) ? ?Drips: ?Propofol: 35.4 ml/hr (provides 935 kcal daily from lipid) ?Fentanyl ?Amiodarone ?LR: 100 ml/hr ? ?Medications  reviewed and include: colace, IV lasix 40 mg once, protonix, miralax, IV abx, IV magnesium sulfate 2 grams once ? ?Labs reviewed: creatinine 1.33, WBC 14.0 ?CBG's: 168 ? ?UOP: 419 ml x 24 hours ?I/O's: +4.1 L since admit ? ?NUTRITION - FOCUSED PHYSICAL EXAM: ? ?Flowsheet Row Most Recent Value  ?Orbital Region No depletion  ?Upper Arm Region No depletion  ?Thoracic and Lumbar Region No depletion  ?Buccal Region Unable to assess  ?Temple Region No depletion  ?Clavicle Bone Region No depletion  ?Clavicle and Acromion Bone Region No depletion  ?Scapular Bone Region No depletion  ?Dorsal Hand No depletion  ?Patellar Region No depletion  ?Anterior Thigh Region No depletion  ?Posterior Calf Region No depletion  ?Edema (RD Assessment) None  ?Hair Reviewed  ?Eyes Reviewed  ?Mouth Reviewed  ?Skin Reviewed  ?Nails Reviewed  ? ?  ? ? ?Diet Order:   ?Diet Order   ? ?       ?  Diet NPO time specified  Diet effective now       ?  ? ?  ?  ? ?  ? ? ?EDUCATION NEEDS:  ? ?No education needs have been identified at this time ? ?Skin:  Skin Assessment: ?Skin Integrity Issues: ?Incisions: neck ? ?Last BM:  no documented BM ? ?Height:  ? ?Ht Readings from Last 1 Encounters:  ?01/02/2022 6' (1.829 m)  ? ? ?Weight:  ? ?Wt Readings from Last 1 Encounters:  ?12/14/21 (!) 149.6 kg  ? ? ?Ideal Body Weight:  80.9 kg ? ?BMI:  Body mass index is 44.73 kg/m?. ? ?Estimated Nutritional Needs:  ? ?  Kcal:  1900-2100 ? ?Protein:  110-130 grams ? ?Fluid:  >/= 2.0 L ? ? ? ?Adam Bryant, MS, RD, LDN ?Inpatient Clinical Dietitian ?Please see AMiON for contact information. ? ?

## 2021-12-14 NOTE — Procedures (Signed)
Extubation Procedure Note ? ?Patient Details:   ?Name: Adam Moreno ?DOB: [birth year] ?MRN: [id number] ?  ?Airway Documentation:  ?  ?Vent end date: (not recorded) Vent end time: (not recorded)  ? ?Evaluation ? O2 sats: stable throughout ?Complications: No apparent complications ?Patient did tolerate procedure well. ?Bilateral Breath Sounds: Clear ?  ?Yes ?Patient extubated to [place]. Vitals stable at this time. No complications. RT will continue to monitor. ?Mcneil Sober ?12/14/2021, 9:29 AM ? ?

## 2021-12-14 NOTE — Progress Notes (Signed)
Urine output continues to be < 30 mL/hr.  Notified elink.  1,000 mL LR bolus ordered.   ?

## 2021-12-14 NOTE — Procedures (Signed)
Bedside Bronchoscopy Procedure Note ?Adam Moreno ?[id number] ?1956/02/11 ? ?Procedure: Bronchoscopy ?Indications: Diagnostic evaluation of the airways ? ?Procedure Details: ?ET Tube Size: ?ET Tube secured at lip (cm): ?Bite block in place: No ?In preparation for procedure, Patient hyper-oxygenated with 100 % FiO2 ?Airway entered and the following bronchi were examined: RUL, RML, RLL, LUL, and LLL.   ?Bronchoscope removed.  , Patient placed back on 100% FiO2 at conclusion of procedure.   ? ?Evaluation ?BP 107/65   Pulse (!) 148   Temp 97.7 ?F (36.5 ?C) (Axillary)   Resp (!) 22   Ht 6' (1.829 m)   Wt (!) 149.6 kg   SpO2 98%   BMI 44.73 kg/m?  ?Breath Sounds:Clear and Diminished ?O2 sats: stable throughout ?Patient's Current Condition: stable ?Specimens:  None ?Complications: No apparent complications ?Patient did tolerate procedure well. ? ? ?Adam Moreno ?12/14/2021, 10:16 AM ? ? ?

## 2021-12-14 NOTE — Procedures (Signed)
Intubation Procedure Note ? ?Renald Haithcock  ?[id number]  ?[birth year] ? ?Date:12/14/21  ?Time:12:51 PM  ? ?Provider Performing:Hajira Verhagen  ? ? ?Procedure: Intubation (28003) ? ?Indication(s) ?Respiratory Failure ? ?Consent ?Unable to obtain consent due to emergent nature of procedure. ? ? ?Anesthesia ?Etomidate and Rocuronium ? ? ?Time Out ?Verified patient identification, verified procedure, site/side was marked, verified correct patient position, special equipment/implants available, medications/allergies/relevant history reviewed, required imaging and test results available. ? ? ?Sterile Technique ?Usual hand hygeine, masks, and gloves were used ? ? ?Procedure Description ?Patient positioned in bed supine.  Sedation given as noted above.  Patient was intubated with endotracheal tube using Glidescope.  View was Grade 1 full glottis .  Number of attempts was 1.  Colorimetric CO2 detector was consistent with tracheal placement. ? ? ?Complications/Tolerance ?None; patient tolerated the procedure well. ?Chest X-ray is ordered to verify placement. ? ? ?EBL ?Minimal ? ? ?Specimen(s) ?None ? ?

## 2021-12-14 NOTE — Progress Notes (Signed)
See flowsheet data below for vital sign changes during event.  ? ?0929: Pt extubated to 6L nasal cannula with RN and RT, breath sounds equal lungs clear. Pt. In good spirits talking with RN and family. Pt and family instructed on importance of maintaining NPO status for 4 hours post extubation to prevent aspiration. Pt and family advised on importance of patient coughing and spitting out secretions, family advised on [place] assistance when RN not in room.  ? ?76: Family called for help, pt. Stating "I can't breathe". ? ?7282: Pt. Entire head purple colored, pt. Not currently breathing. Manual bagging with ambu bag began by this RN and Dr. Tacy Learn.  ? ?0942: Zoll pads placed and backboard under pt. ? ?0601: RSI meds being given (see MAR) ? ?0949: Positive color change, ETT placed successfully by Dr. Tacy Learn with equal breath sounds auscultated.  ? ?0955: Stat EKG obtained per Dr. Tacy Learn, hand delivered to MD. Pt in afib RVR. ? ?0958: 150 amio bolus started with continuous 60/hr infusion afterwards to treat afib RVR. Pharmacist will place order ? ?1001: Bronchoscopy performed by Dr Tacy Learn, provider verbalizes no obstructions visualized.  ? ? ? 12/14/21 0938 12/14/21 0939 12/14/21 0940  ?Vitals  ?Pulse Rate 93 99 (!) 115  ?ECG Heart Rate 93 (!) 137 (!) 119  ?Resp 18 (!) 39 (!) 24  ?Oxygen Therapy  ?SpO2 93 % (!) 79 % (!) 30 %  ? ? ? ?

## 2021-12-14 NOTE — Progress Notes (Signed)
Pt had a brief, asymptomatic 7 beat run of Vtach at 2059.  Monitor strip of event printed and placed in pt's chart and added to epic.  Will continue to monitor. ?

## 2021-12-14 NOTE — Plan of Care (Signed)
?  Problem: Clinical Measurements: ?Goal: Respiratory complications will improve ?Outcome: Progressing ?  ?Problem: Safety: ?Goal: Ability to remain free from injury will improve ?Outcome: Progressing ?  ?Problem: Skin Integrity: ?Goal: Risk for impaired skin integrity will decrease ?Outcome: Progressing ?  ?

## 2021-12-14 NOTE — Anesthesia Postprocedure Evaluation (Signed)
Anesthesia Post Note ? ?Patient: Adam Moreno ? ?Procedure(Moreno) Performed: POSTERIOR CERVICAL LAMINECTOMY CERVICAL ONE, CERVICAL TWO For Tumor Resection ? ?  ? ?Patient location during evaluation: SICU ?Anesthesia Type: General ?Level of consciousness: sedated ?Pain management: pain level controlled ?Vital Signs Assessment: post-procedure vital signs reviewed and stable ?Respiratory status: patient remains intubated per anesthesia plan ?Cardiovascular status: stable ?Postop Assessment: no apparent nausea or vomiting ?Anesthetic complications: no ? ? ?No notable events documented. ? ?Last Vitals:  ?Vitals:  ? 12/14/21 0500 12/14/21 0600  ?BP: 119/70 104/65  ?Pulse: 61 65  ?Resp: 18 18  ?Temp:    ?SpO2: 97% 96%  ?  ?Last Pain:  ?Vitals:  ? 12/14/21 0400  ?TempSrc: Oral  ?PainSc:   ? ? ?  ?  ?  ?  ?  ?  ? ?Adam Moreno ? ? ? ? ?

## 2021-12-14 NOTE — Progress Notes (Signed)
? ?NAME:  Adam Moreno, MRN:  [id number], DOB:  1955-11-09, LOS: 3 ?ADMISSION DATE:  12/07/2021 CONSULTATION DATE:  12/04/2021 ?REFERRING MD:  Zada Finders - NSGY CHIEF COMPLAINT:  Hypotension  ? ?History of Present Illness:  ?66 year old man who presented to [facility] 5/9 for planned laminectomy/surgical resection of C1-C2 nerve sheath tumor with NSGY (Dr. Zada Finders). 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 ? ?Patient presented for scheduled laminectomy/nerve sheath tumor resection 5/9. Post-induction/intubation prior to the procedure, patient became hypotensive and it was unclear if he had a pulse (true pulselessness versus instrumentation). Epi was administered x 1 and a short course of CPR was completed with return of palpable pulse. Case was aborted. Patient was successfully extubated in the OR with appropriate mental status. Placed on Neosynephrine for BP support. Patient was transferred to ICU for further workup. ?PCCM was consulted for help with evaluation and medical management ? ?Pertinent Medical History:  ? ?Past Medical History:  ?Diagnosis Date  ? Depression   ? Hypertension   ? Sleep apnea   ? doesn't wear CPAP  ? ?Significant Hospital Events: ?Including procedures, antibiotic start and stop dates in addition to other pertinent events   ?5/9 - Presented to [facility] for planned C1-C2 nerve sheath tumor resection/laminectomy. Hypotensive shortly after induction/intubation requiring case to be aborted. Extubated in OR. Transferred to ICU for further cardiac workup. ? ?Interim History / Subjective:  ?No overnight issues ?This morning patient tolerated spontaneous breathing trial, he was extubated but after 20 minutes he started with choking sensation, started desaturating with oxygen saturation dropped to 60s and he became cyanotic, he was emergently intubated and diagnostic bronc was done which was unrevealing ?He went into  A-fib with RVR with heart rate ranging in 200s, started on amiodarone ? ?Objective:  ?Blood pressure 123/75, pulse 89, temperature 98.5 ?F (36.9 ?C), temperature source Axillary, resp. rate 18, height 6' (1.829 m), weight (!) 149.6 kg, SpO2 99 %. ?   ?Vent Mode: PRVC ?FiO2 (%):  [40 %-100 %] 100 % ?Set Rate:  [18 bmp] 18 bmp ?Vt Set:  [620 mL] 620 mL ?PEEP:  [5 cmH20-10 cmH20] 10 cmH20 ?Pressure Support:  [5 cmH20] 5 cmH20 ?Plateau Pressure:  [14 cmH20-23 cmH20] 23 cmH20  ? ?Intake/Output Summary (Last 24 hours) at 12/14/2021 1255 ?Last data filed at 12/14/2021 0800 ?Krah per 24 hour  ?Intake 4386.79 ml  ?Output 394 ml  ?Net 3992.79 ml  ? ?Filed Weights  ? 12/09/2021 0950 12/14/21 0500  ?Weight: (!) 147.4 kg (!) 149.6 kg  ? ?Physical Examination: ? Physical exam: ?General: Crtitically ill-appearing morbidly obese male, orally intubated ?HEENT: [place]/AT, eyes anicteric.  ETT and OGT in place ?Neuro: Sedated, not following commands.  Eyes are closed.  Pupils 3 mm bilateral reactive to light ?Chest: Coarse breath sounds, no wheezes or rhonchi ?Heart: Irregularly irregular, tachycardic, no murmurs or gallops ?Abdomen: Soft, nontender, nondistended, bowel sounds present ?Skin: No rash ? ?Resolved Hospital Problem List:  ?Medication related Hypotension anesthesia  ? ?Assessment & Plan:  ?Sudden onset of choking ?Acute hypoxic respiratory failure ?Probable aspiration pneumonia ?Acute metabolic encephalopathy due to hypoxia ?Patient was extubated this morning, he kept on coming complaining secretions in his throat which he was spitting it out and suctioning, after 20 minutes of extubation he suddenly started choking sensation, complained about shortness of breath and" cannot breathe", he became cyanotic and his oxygen saturation went down to 60s ?Emergently patient was started on BVM  with improvement in oxygen saturation, he became obtunded, emergently endotracheally intubation was performed ?Postintubation diagnostic  bronchoscopy was done to see if there is any object which was causing him to choke, his lungs were clear, no foreign objects were notified ?He had small amount of thin secretions throughout respiratory tree ?Continue IV sedation with propofol and fentanyl with RASS goal 0/-1, avoid deep sedation ?Continue lung protective ventilation ?VAP bundle ?Started on IV Unasyn ?Follow-up respiratory culture ? ?New diagnosis of atrial fibrillation with rapid ventricular response ?Acute on chronic HFpEF ?During episode of hypoxia patient went into A-fib with RVR, his heart rate was ranging in 200s ?He was started on IV amiodarone bolus followed by infusion ?Trend troponins ?Give 1 dose of Lasix ?X-ray chest is suggestive of pulmonary edema versus aspiration pneumonia ?Monitor intake and output ?Continue telemetry monitoring ? ?Morbid obesity ?OSA, noncompliant with CPAP ?Patient refused CPAP overnight ?Encouraged to use CPAP every night to prevent further complications and pulmonary hypertension ? ?Schwannoma of C1-C2 nerve sheath ?S/p tumor resection ?We will get MRI spine with and without contrast per discussion with neurosurgery ? ?Hypokalemia/hypomagnesemia ?AKI ?Continue aggressive electrolyte supplement ?Monitor BMP and and electrolytes ?Avoid nephrotoxic agents as able ? ?Hypertension ?Home regimen: Norvasc, Lasix, Toprol-XL, valsartan-HCTZ ?Hold oral antihypertensive for now ? ?Best Practice: (right click and "Reselect all SmartList Selections" daily)  ? ?Per Primary Team ? ?Labs:  ?CBC: ?Recent Labs  ?Lab 12/08/2021 ?1356 12/21/2021 ?1604 12/12/21 ?0503 12/14/21 ?0459  ?WBC  --  10.8* 9.8 14.0*  ?HGB 14.6 14.6 14.4 13.4  ?HCT 43.0 42.9 43.0 39.2  ?MCV  --  86.3 86.2 86.3  ?PLT  --  291 256 250  ? ?Basic Metabolic Panel: ?Recent Labs  ?Lab 12/05/2021 ?1035 12/25/2021 ?1356 12/09/2021 ?1459 01/02/2022 ?1830 12/12/21 ?0503 12/14/21 ?0459  ?NA 138 140 138  --  140 138  ?K 3.2* 3.1* 3.1*  --  3.3* 3.8  ?CL 101  --  102  --  103 104   ?CO2 26  --  28  --  28 27  ?GLUCOSE 136*  --  154*  --  111* 144*  ?BUN 35*  --  31*  --  22 20  ?CREATININE 1.35*  --  1.54*  --  1.12 1.33*  ?CALCIUM 9.2  --  8.7*  --  8.8* 8.7*  ?MG  --   --   --  1.8 2.0 1.7  ?PHOS  --   --   --   --  2.9  --   ? ?GFR: ?Estimated Creatinine Clearance: 84.4 mL/min (A) (by C-G formula based on SCr of 1.33 mg/dL (H)). ?Recent Labs  ?Lab 12/09/2021 ?1604 12/12/21 ?0503 12/14/21 ?0459  ?WBC 10.8* 9.8 14.0*  ? ?Liver Function Tests: ?Recent Labs  ?Lab 12/09/2021 ?1459 12/12/21 ?0503  ?AST 27 23  ?ALT 30 23  ?ALKPHOS 63 53  ?BILITOT 1.0 1.0  ?PROT 6.5 6.6  ?ALBUMIN 3.3* 3.4*  ? ?No results for input(s): LIPASE, AMYLASE in the last 168 hours. ?No results for input(s): AMMONIA in the last 168 hours. ? ?ABG: ?   ?Component Value Date/Time  ? PHART 7.209 (L) 12/05/2021 1356  ? PCO2ART 56.0 (H) 12/18/2021 1356  ? PO2ART 66 (L) 12/17/2021 1356  ? HCO3 22.3 12/24/2021 1356  ? TCO2 24 12/06/2021 1356  ? ACIDBASEDEF 6.0 (H) 12/21/2021 1356  ? O2SAT 87 12/26/2021 1356  ?  ? ?Coagulation Profile: ?No results for input(s): INR, PROTIME in the last 168 hours. ? ?Cardiac  Enzymes: ?No results for input(s): CKTOTAL, CKMB, CKMBINDEX, TROPONINI in the last 168 hours. ? ?HbA1C: ?No results found for: HGBA1C ? ?CBG: ?Recent Labs  ?Lab 12/05/2021 ?0726 12/14/21 ?1116  ?GLUCAP 117* 168*  ? ? ?  ?Total critical care time: 61 minutes ? ?Performed by: Jacky Kindle ?  ?Critical care time was exclusive of separately billable procedures and treating other patients. ?  ?Critical care was necessary to treat or prevent imminent or life-threatening deterioration. ?  ?Critical care was time spent personally by me on the following activities: development of treatment plan with patient and/or surrogate as well as nursing, discussions with consultants, evaluation of patient's response to treatment, examination of patient, obtaining history from patient or surrogate, ordering and performing treatments and interventions,  ordering and review of laboratory studies, ordering and review of radiographic studies, pulse oximetry and re-evaluation of patient's condition. ?  ?Jacky Kindle MD ?[facility] ?See Amion for pager

## 2021-12-15 ENCOUNTER — Inpatient Hospital Stay (HOSPITAL_COMMUNITY): Payer: BC Managed Care – PPO

## 2021-12-15 DIAGNOSIS — D334 Benign neoplasm of spinal cord: Secondary | ICD-10-CM | POA: Diagnosis not present

## 2021-12-15 DIAGNOSIS — J9601 Acute respiratory failure with hypoxia: Secondary | ICD-10-CM | POA: Diagnosis not present

## 2021-12-15 DIAGNOSIS — F410 Panic disorder [episodic paroxysmal anxiety] without agoraphobia: Secondary | ICD-10-CM | POA: Diagnosis not present

## 2021-12-15 LAB — GLUCOSE, CAPILLARY
Glucose-Capillary: 101 mg/dL — ABNORMAL HIGH (ref 70–99)
Glucose-Capillary: 115 mg/dL — ABNORMAL HIGH (ref 70–99)
Glucose-Capillary: 108 mg/dL — ABNORMAL HIGH (ref 70–99)
Glucose-Capillary: 169 mg/dL — ABNORMAL HIGH (ref 70–99)
Glucose-Capillary: 141 mg/dL — ABNORMAL HIGH (ref 70–99)
Glucose-Capillary: 137 mg/dL — ABNORMAL HIGH (ref 70–99)

## 2021-12-15 LAB — BASIC METABOLIC PANEL
Anion gap: 7 (ref 5–15)
BUN: 23 mg/dL (ref 8–23)
CO2: 29 mmol/L (ref 22–32)
Calcium: 7.9 mg/dL — ABNORMAL LOW (ref 8.9–10.3)
Chloride: 105 mmol/L (ref 98–111)
Creatinine, Ser: 1.36 mg/dL — ABNORMAL HIGH (ref 0.61–1.24)
GFR, Estimated: 58 mL/min — ABNORMAL LOW (ref >60)
Glucose, Bld: 159 mg/dL — ABNORMAL HIGH (ref 70–99)
Potassium: 3.7 mmol/L (ref 3.5–5.1)
Sodium: 141 mmol/L (ref 135–145)

## 2021-12-15 LAB — PHOSPHORUS
Phosphorus: 3.4 mg/dL (ref 2.5–4.6)
Phosphorus: 4.1 mg/dL (ref 2.5–4.6)

## 2021-12-15 LAB — TROPONIN I (HIGH SENSITIVITY): Troponin I (High Sensitivity): 42 ng/L — ABNORMAL HIGH (ref <18)

## 2021-12-15 LAB — MAGNESIUM
Magnesium: 2 mg/dL (ref 1.7–2.4)
Magnesium: 1.9 mg/dL (ref 1.7–2.4)

## 2021-12-15 IMAGING — DX DG CHEST 1V PORT
1 series · 1 of 1 positions shown · non-contrast
Comparison: Multiple previous chest x-rays. The most recent is
[DATE]

CLINICAL DATA: Follow-up respiratory status.

EXAM:
PORTABLE CHEST 1 VIEW

[chest]
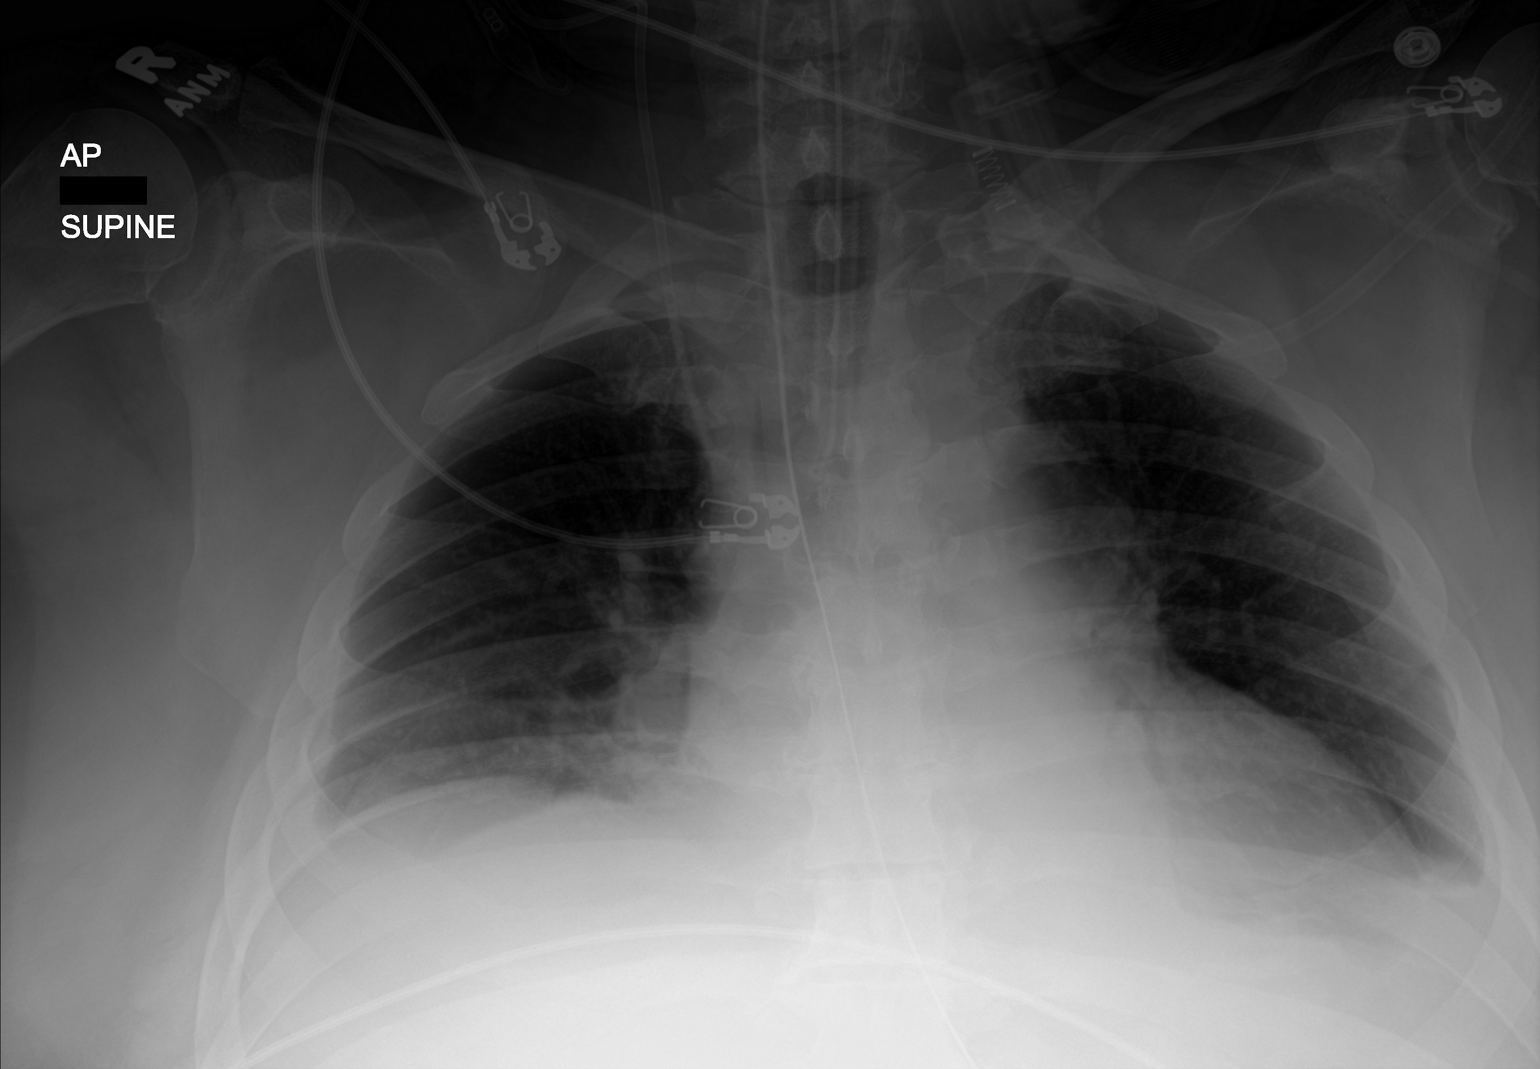

[1 of 1 positions shown; findings below may reference images not displayed]

FINDINGS: The endotracheal tube, NG tube and right IJ central venous catheters
are stable.

Low lung volumes with vascular crowding and atelectasis. Persistent
bilateral pleural effusions.
IMPRESSION: 1. Stable support apparatus.
2. Low lung volumes with vascular crowding and atelectasis.
3. Persistent bilateral pleural effusions.

## 2021-12-15 IMAGING — DX DG ABD PORTABLE 1V
1 series · 2 of 2 positions shown · non-contrast
Comparison: [DATE]

CLINICAL DATA: NG tube position.

EXAM:
PORTABLE ABDOMEN - 1 VIEW

[Series 1: abdomen · 0.14mm/px · 2 of 2 slices shown]
[im 1/2]
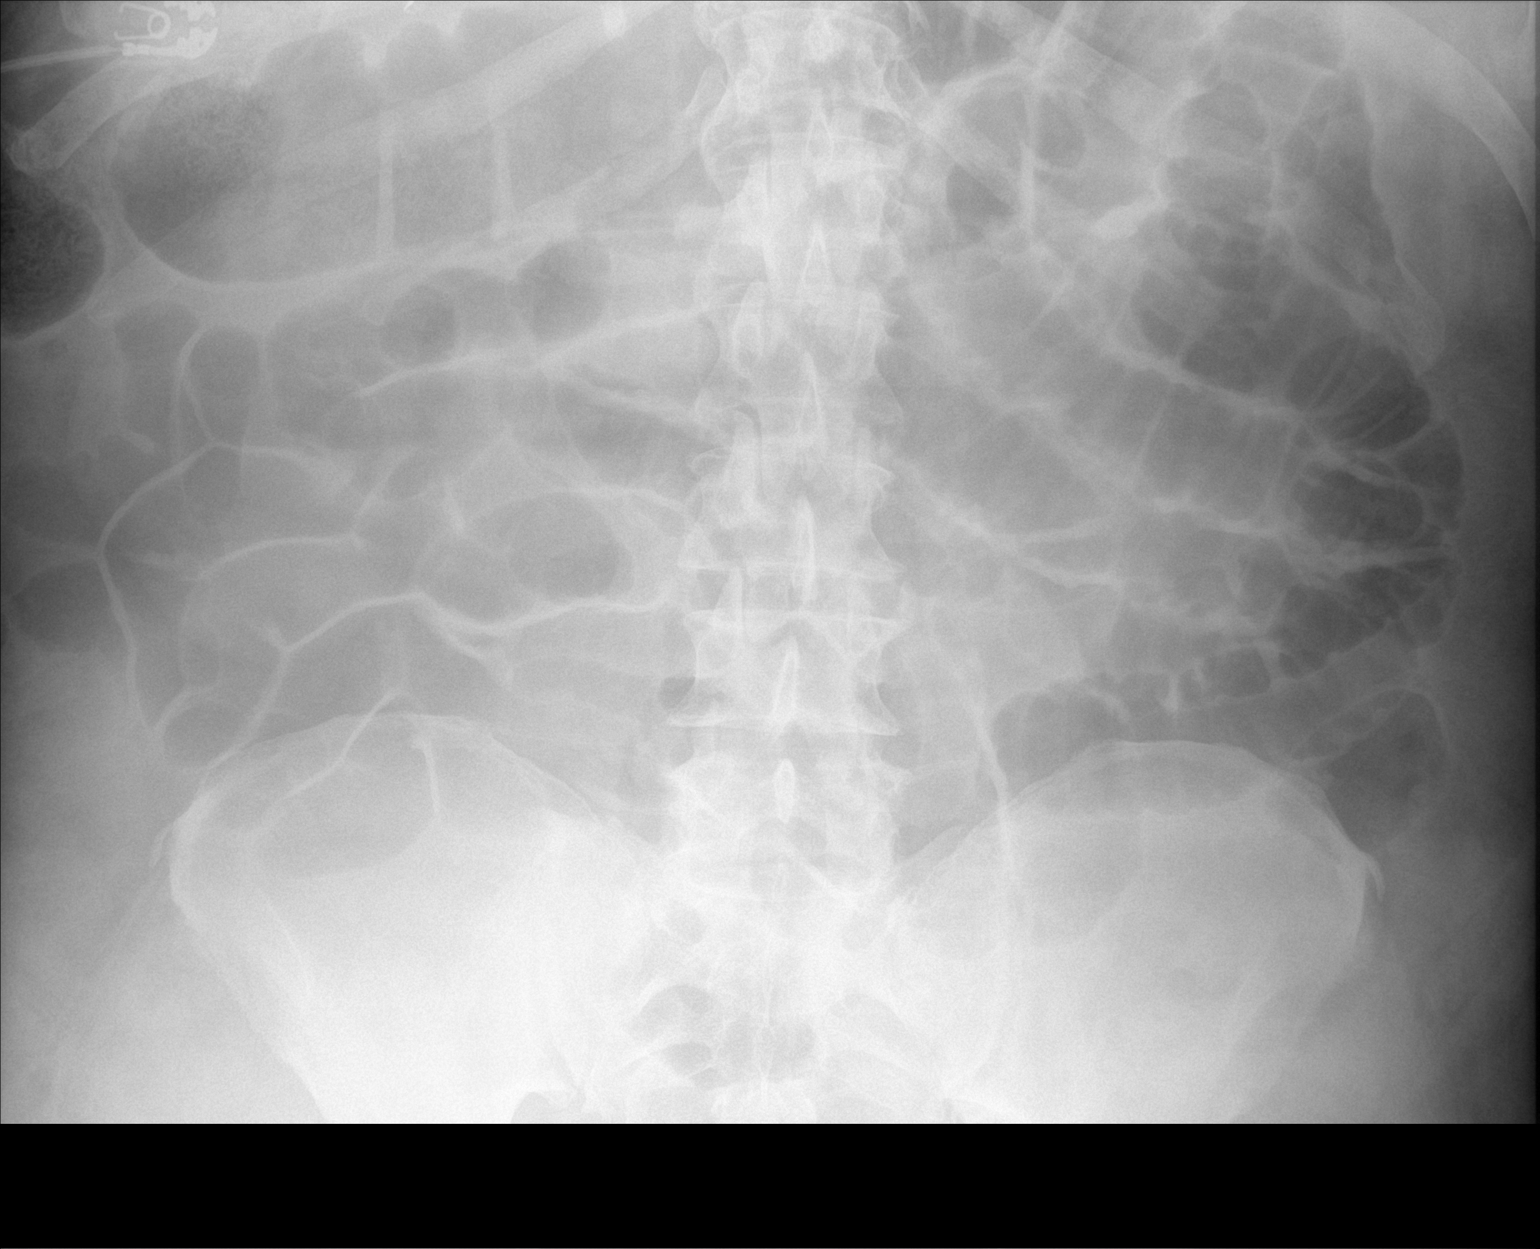
[im 2/2]
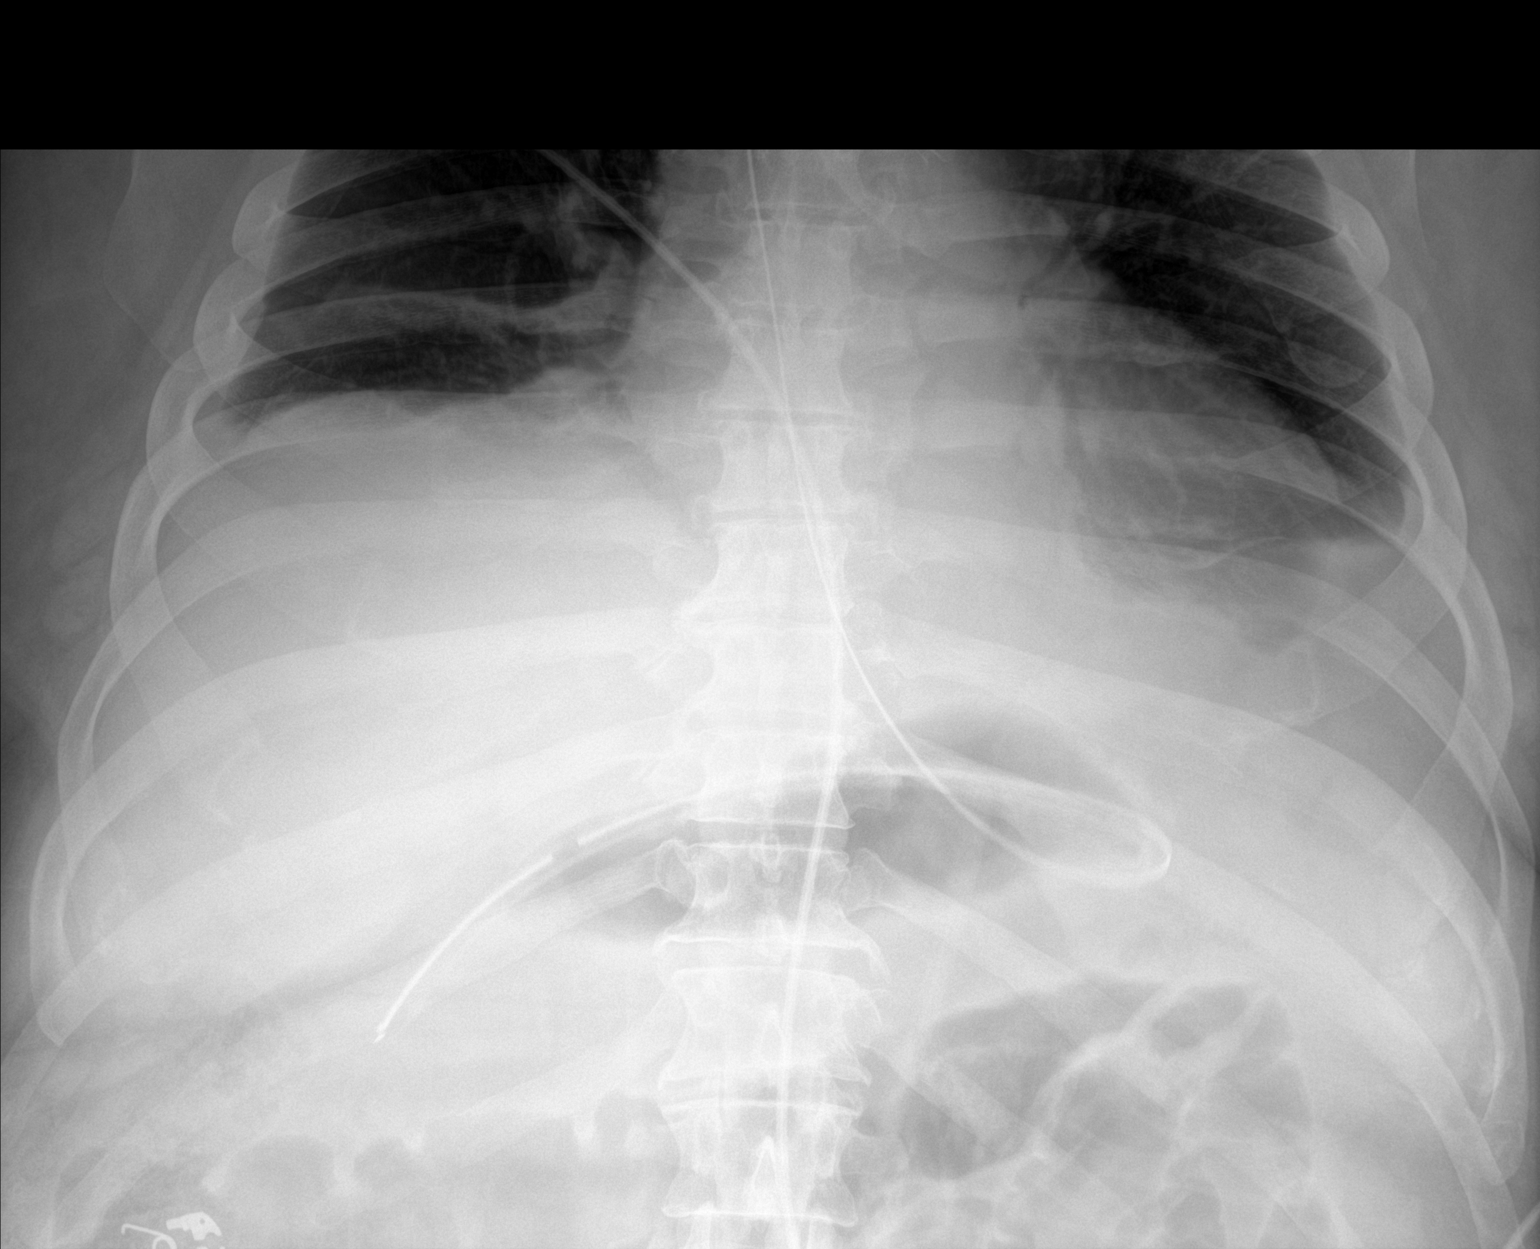

[2 of 2 positions shown; findings below may reference images not displayed]

FINDINGS: Stable position of the NG tube. The tip is in the region of the
duodenal bulb or descending duodenum. Diffuse air throughout the
bowel most likely representing a postoperative ileus.
IMPRESSION: NG tube tip is in the duodenal bulb or descending duodenum.

Diffuse postoperative ileus.

## 2021-12-15 IMAGING — MR MR CERVICAL SPINE WO/W CM
5 of 8 series · 26 of 48 positions shown · IV contrast (gadavist)
Comparison: Comparison made with prior MRI from [DATE].

CLINICAL DATA: Follow-up examination status post C1-2 nerve sheath
tumor resection and laminectomy.

EXAM:
MRI CERVICAL SPINE WITHOUT AND WITH CONTRAST
TECHNIQUE: Multiplanar and multiecho pulse sequences of the cervical spine, to
include the craniocervical junction and cervicothoracic junction,
were obtained without and with intravenous contrast.
CONTRAST:  10mL GADAVIST GADOBUTROL 1 MMOL/ML IV SOLN

[Series 5: T2 · sagittal · 3.0mm · 0.69mm/px · 3 of 15 slices shown (1 of 2)]
[im 1/15]
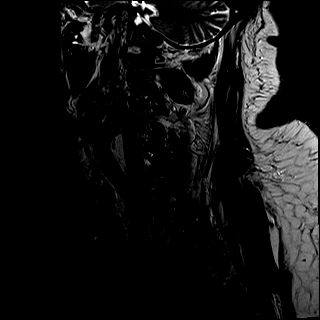
[im 8/15]
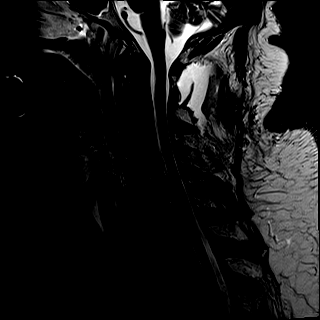
[im 15/15]
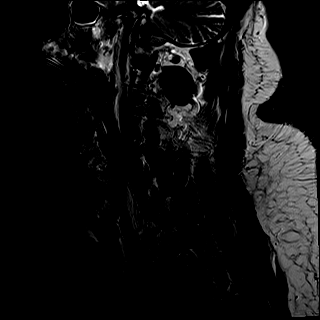

[Series 6: T1 · sagittal · 3.0mm · 0.69mm/px · 3 of 15 slices shown (1 of 2)]
[im 1/15]
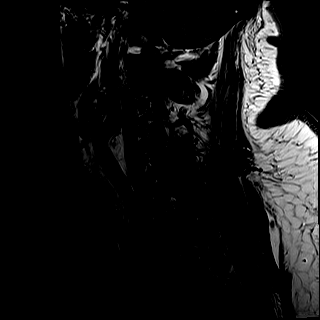
[im 8/15]
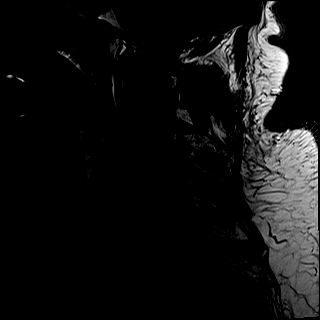
[im 15/15]
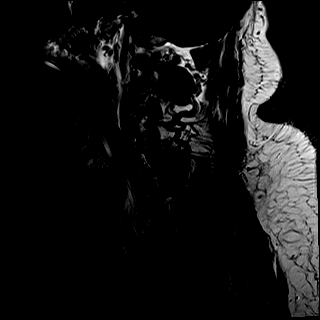

[Series 8: T2 · axial · 3.0mm · 0.66mm/px · z∈[-107,+26]mm · 9 of 43 slices shown (2 of 2)]
[im 1/43]
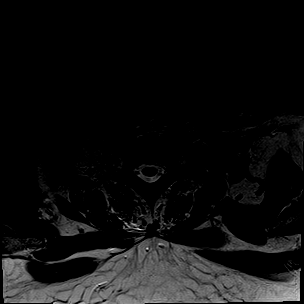
[im 6/43]
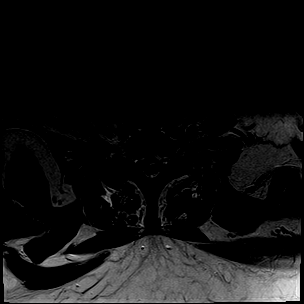
[im 11/43]
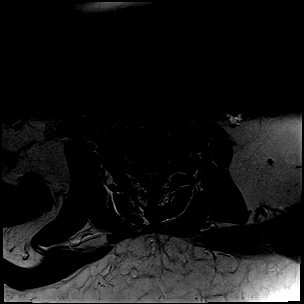
[im 16/43]
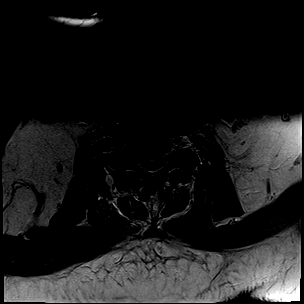
[im 22/43]
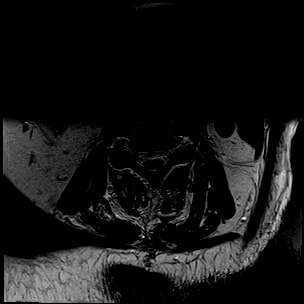
[im 27/43]
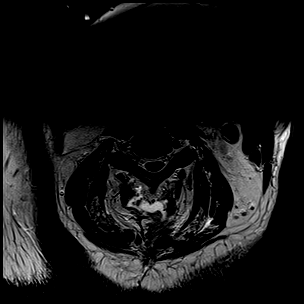
[im 32/43]
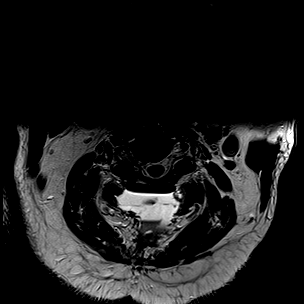
[im 37/43]
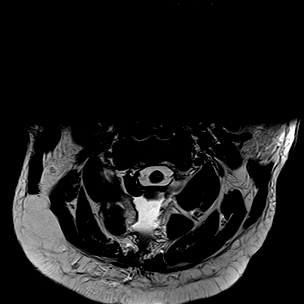
[im 43/43]
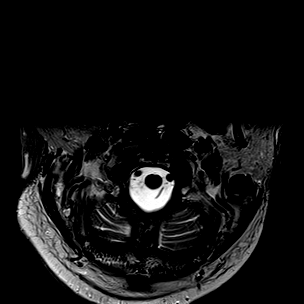

[Series 10: T1 · axial · 3.0mm · 0.39mm/px · z∈[-107,+26]mm · 9 of 43 slices shown (2 of 2)]
[im 1/43]
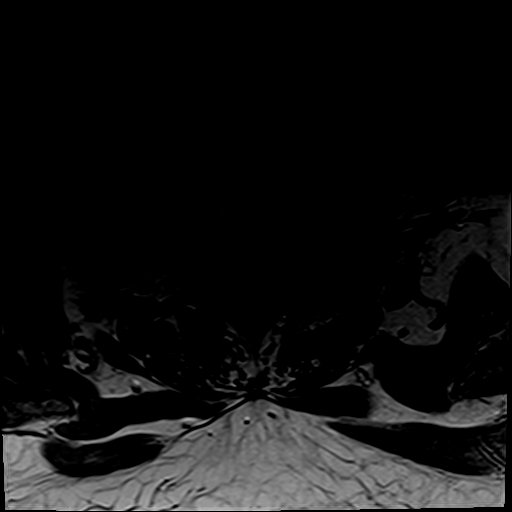
[im 6/43]
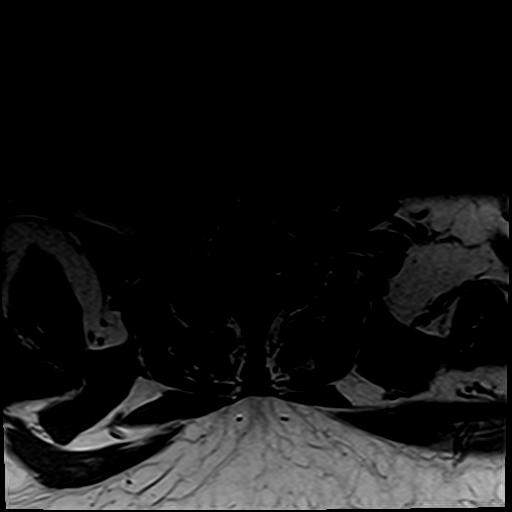
[im 11/43]
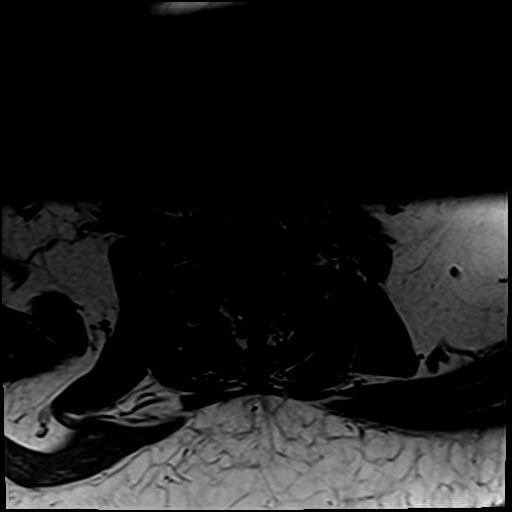
[im 16/43]
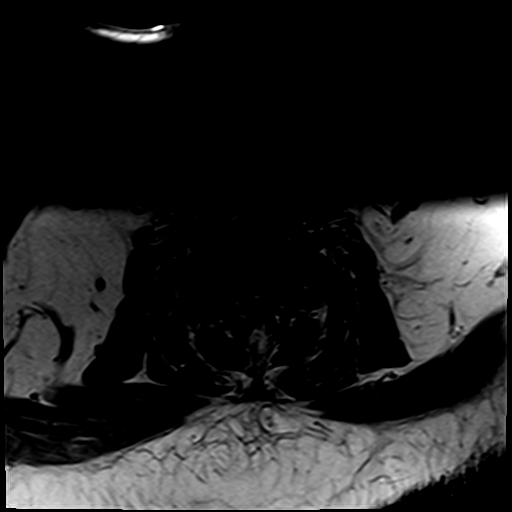
[im 22/43]
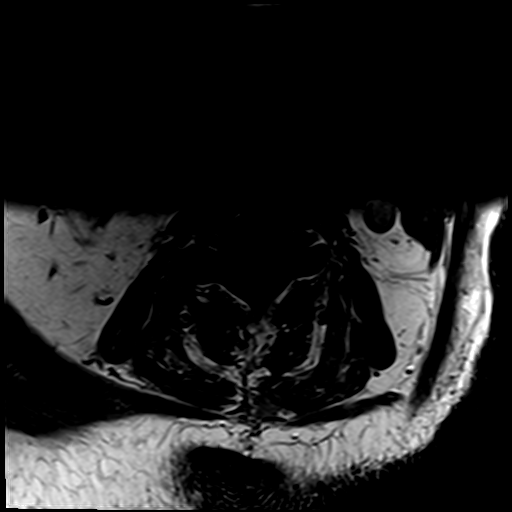
[im 27/43]
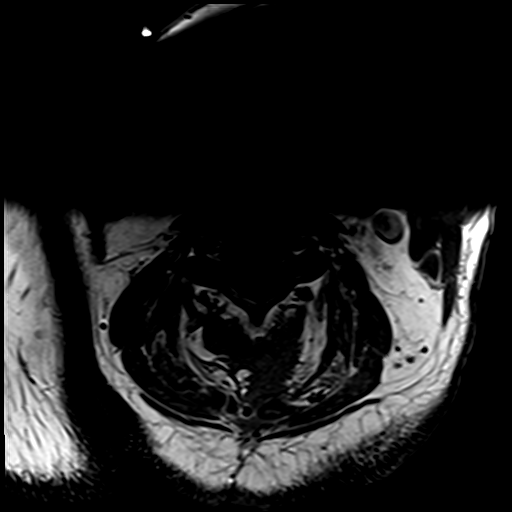
[im 32/43]
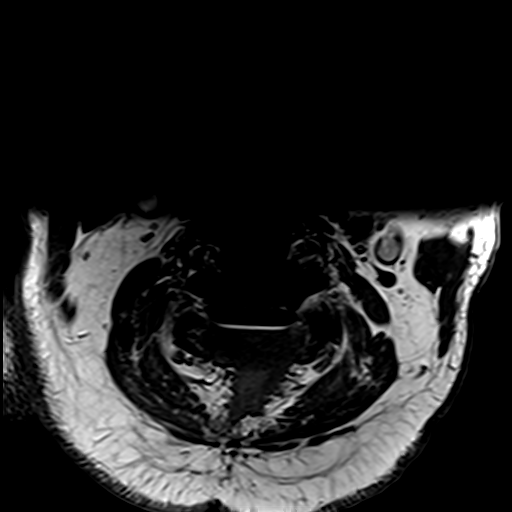
[im 37/43]
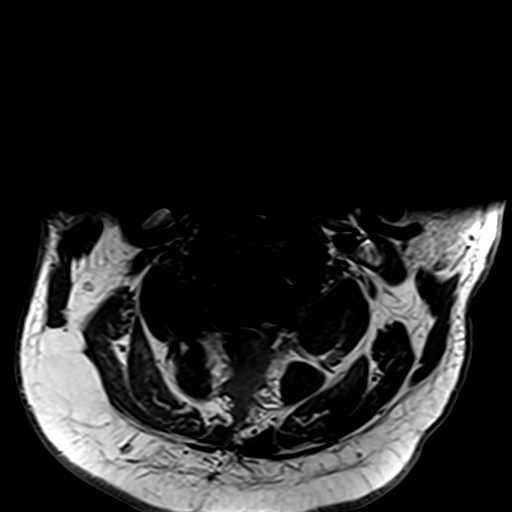
[im 43/43]
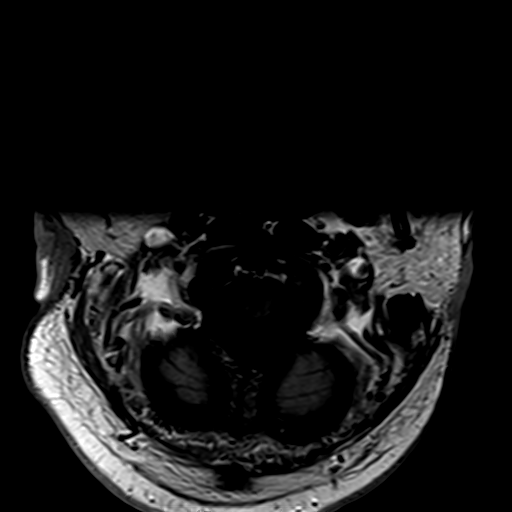

[Series 11: T1 fat-sat post-contrast · sagittal · 3.0mm · 0.43mm/px · 2 of 15 slices shown]
[im 1/15]
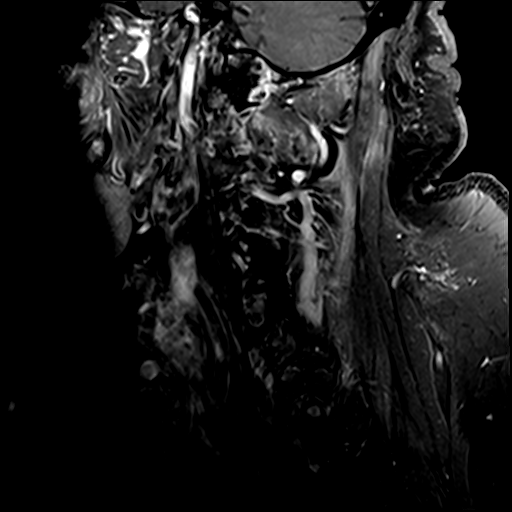
[im 8/15]
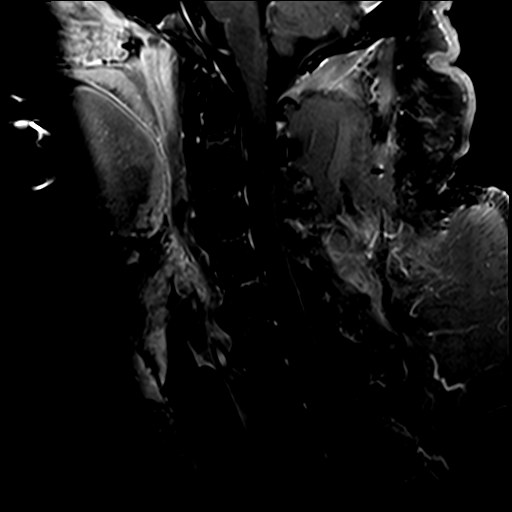

[26 of 48 positions shown; findings below may reference images not displayed]

FINDINGS: Alignment: Straightening of the normal cervical lordosis. No
interval listhesis or malalignment.

Vertebrae: Vertebral body height maintained without acute or
interval fracture. Bone marrow signal intensity within normal
limits. No discrete or worrisome osseous lesions.

Postoperative changes from interval resection of previously seen
nerve sheath tumor on the right at C1-2. Sequelae of right
laminectomies at C1 and C2. Postoperative collection measuring
approximately 4.3 x 5.2 x 5.6 cm seen at the resection bed, most
consistent with a benign postoperative collection/seroma. Probable
small amount of admixed internal blood with internal fluid-fluid
level. Few scattered foci of postoperative gas are present as well.
The previously seen tumor has been resected, with no visible
residual tumor seen at this time. No adverse features are
identified.

Cord: Mild residual cord signal abnormality seen at the level of
C1-2, improved from previous exam (series 7, image 9). Signal
intensity within the cord is otherwise normal. No epidural hematoma
or other collection.

Posterior Fossa, vertebral arteries, paraspinal tissues: Visualized
brain and posterior fossa within normal limits. Craniocervical
junction otherwise normal. Postoperative changes within the upper
posterior soft tissues as above. Preserved flow voids are seen
within the vertebral arteries bilaterally.

Disc levels:

C1-2: Resolved mass effect on the upper cervical cord at this level.
No significant residual spinal stenosis.

C2-C3: Minimal disc bulge with right-sided uncovertebral spurring.
No significant canal or foraminal stenosis.

C3-C4: Shallow posterior disc osteophyte mildly indents the ventral
thecal sac. No significant spinal stenosis. Foramina remain patent.

C4-C5: Right paracentral disc protrusion indents the ventral thecal
sac (series 8, image 24). Mild spinal stenosis without frank cord
impingement. Foramina remain patent.

C5-C6: Left paracentral disc protrusion indents the ventral thecal
sac (series 8, image 30). Minimal flattening of the ventral cord
without cord signal changes. Mild spinal stenosis. Right-sided
uncovertebral spurring with mild right C6 foraminal narrowing. Left
neural foramen remains patent.

C6-C7: Degenerative intervertebral disc space narrowing. Broad
posterior disc osteophyte indents the ventral thecal sac. Minimal
cord flattening without cord signal changes. Mild spinal stenosis.
Moderate left worse than right C7 foraminal stenosis.

C7-T1: Negative interspace. Right worse than left facet arthrosis.
Mild right C8 foraminal stenosis. Left neural foramina remains
patent.

Visualized upper thoracic spine demonstrates no significant finding.
IMPRESSION: 1. Postoperative changes from interval resection of previously seen
right-sided nerve sheath tumor at C1-2. No visible residual tumor or
adverse features. Improved/resolved mass effect upon the adjacent
upper cervical spinal cord. Trace residual cord signal abnormality
at this level, improved from prior exam.
[DATE] x 5.2 x 5.6 cm postoperative collection at the resection bed,
most consistent with a benign postoperative collection/seroma.
3. Underlying multilevel cervical spondylosis, otherwise stable.

## 2021-12-15 MED ORDER — ETOMIDATE 2 MG/ML IV SOLN: 20.0000 mg | Freq: Once | INTRAVENOUS | Status: AC

## 2021-12-15 MED ORDER — ROCURONIUM BROMIDE 50 MG/5ML IV SOLN
100.0000 mg | Freq: Once | INTRAVENOUS | Status: AC
  Filled 2021-12-15: qty 10

## 2021-12-15 MED ORDER — ALBUTEROL SULFATE (2.5 MG/3ML) 0.083% IN NEBU
INHALATION_SOLUTION | RESPIRATORY_TRACT | Status: AC
  Filled 2021-12-15: qty 3

## 2021-12-15 MED ORDER — OXYCODONE HCL 5 MG/5ML PO SOLN
10.0000 mg | ORAL | Status: DC | PRN
  Administered 2021-12-15 – 2021-12-20 (×8): 10 mg
  Filled 2021-12-15 (×9): qty 10

## 2021-12-15 MED ORDER — GADOBUTROL 1 MMOL/ML IV SOLN
10.0000 mL | Freq: Once | INTRAVENOUS | Status: AC | PRN
  Administered 2021-12-15: 10 mL via INTRAVENOUS

## 2021-12-15 MED ORDER — MIDAZOLAM HCL 2 MG/2ML IJ SOLN
INTRAMUSCULAR | Status: AC
  Filled 2021-12-15: qty 2

## 2021-12-15 MED ORDER — MAGNESIUM SULFATE 2 GM/50ML IV SOLN
2.0000 g | Freq: Once | INTRAVENOUS | Status: AC
  Administered 2021-12-15: 2 g via INTRAVENOUS
  Filled 2021-12-15: qty 50

## 2021-12-15 MED ORDER — CYCLOBENZAPRINE HCL 10 MG PO TABS: 10.0000 mg | ORAL_TABLET | Freq: Three times a day (TID) | ORAL | Status: DC | PRN

## 2021-12-15 MED ORDER — DEXMEDETOMIDINE HCL IN NACL 400 MCG/100ML IV SOLN
0.0000 ug/kg/h | INTRAVENOUS | Status: DC
  Administered 2021-12-15 (×2): 1.2 ug/kg/h via INTRAVENOUS
  Administered 2021-12-15: 0.4 ug/kg/h via INTRAVENOUS
  Administered 2021-12-15 – 2021-12-18 (×22): 1.2 ug/kg/h via INTRAVENOUS
  Filled 2021-12-15: qty 100
  Filled 2021-12-15 (×2): qty 200
  Filled 2021-12-15 (×2): qty 100
  Filled 2021-12-15: qty 200
  Filled 2021-12-15: qty 100
  Filled 2021-12-15: qty 300
  Filled 2021-12-15 (×3): qty 100
  Filled 2021-12-15: qty 200
  Filled 2021-12-15 (×6): qty 100
  Filled 2021-12-15: qty 200
  Filled 2021-12-15 (×2): qty 100
  Filled 2021-12-15: qty 200
  Filled 2021-12-15: qty 100

## 2021-12-15 MED ORDER — FENTANYL 2500MCG IN NS 250ML (10MCG/ML) PREMIX INFUSION
25.0000 ug/h | INTRAVENOUS | Status: DC
  Administered 2021-12-16: 275 ug/h via INTRAVENOUS
  Administered 2021-12-16: 400 ug/h via INTRAVENOUS
  Administered 2021-12-16: 200 ug/h via INTRAVENOUS
  Administered 2021-12-16 – 2021-12-17 (×3): 400 ug/h via INTRAVENOUS
  Filled 2021-12-15 (×6): qty 250

## 2021-12-15 MED ORDER — ETOMIDATE 2 MG/ML IV SOLN
INTRAVENOUS | Status: AC
  Administered 2021-12-15: 20 mg
  Filled 2021-12-15: qty 20

## 2021-12-15 MED ORDER — FENTANYL CITRATE PF 50 MCG/ML IJ SOSY
PREFILLED_SYRINGE | INTRAMUSCULAR | Status: AC
  Administered 2021-12-15: 50 ug via INTRAVENOUS
  Filled 2021-12-15: qty 2

## 2021-12-15 MED ORDER — DEXMEDETOMIDINE HCL IN NACL 400 MCG/100ML IV SOLN
INTRAVENOUS | Status: AC
  Filled 2021-12-15: qty 100

## 2021-12-15 MED ORDER — FENTANYL BOLUS VIA INFUSION
25.0000 ug | INTRAVENOUS | Status: DC | PRN
  Administered 2021-12-16 – 2021-12-17 (×6): 100 ug via INTRAVENOUS
  Filled 2021-12-15: qty 100

## 2021-12-15 MED ORDER — OXYCODONE HCL 5 MG/5ML PO SOLN: 5.0000 mg | ORAL | Status: DC | PRN

## 2021-12-15 MED ORDER — NOREPINEPHRINE 16 MG/250ML-% IV SOLN
0.0000 ug/min | INTRAVENOUS | Status: DC
  Administered 2021-12-15: 10 ug/min via INTRAVENOUS
  Filled 2021-12-15: qty 250

## 2021-12-15 MED ORDER — POLYETHYLENE GLYCOL 3350 17 G PO PACK: 17.0000 g | PACK | Freq: Every day | ORAL | Status: DC | PRN

## 2021-12-15 MED ORDER — ROCURONIUM BROMIDE 10 MG/ML (PF) SYRINGE
PREFILLED_SYRINGE | INTRAVENOUS | Status: AC
  Filled 2021-12-15: qty 10

## 2021-12-15 MED ORDER — FENTANYL CITRATE PF 50 MCG/ML IJ SOSY
50.0000 ug | PREFILLED_SYRINGE | INTRAMUSCULAR | Status: DC | PRN
  Administered 2021-12-15: 50 ug via INTRAVENOUS
  Filled 2021-12-15 (×3): qty 1

## 2021-12-15 NOTE — Progress Notes (Signed)
? ?NAME:  Adam Moreno, MRN:  [id number], DOB:  02-Apr-1956, LOS: 4 ?ADMISSION DATE:  12/30/2021 CONSULTATION DATE:  12/18/2021 ?REFERRING MD:  Zada Finders - NSGY CHIEF COMPLAINT:  Hypotension  ? ?History of Present Illness:  ?66 year old man who presented to [facility] 5/9 for planned laminectomy/surgical resection of C1-C2 nerve sheath tumor with NSGY (Dr. Zada Finders). 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 ? ?Patient presented for scheduled laminectomy/nerve sheath tumor resection 5/9. Post-induction/intubation prior to the procedure, patient became hypotensive and it was unclear if he had a pulse (true pulselessness versus instrumentation). Epi was administered x 1 and a short course of CPR was completed with return of palpable pulse. Case was aborted. Patient was successfully extubated in the OR with appropriate mental status. Placed on Neosynephrine for BP support. Patient was transferred to ICU for further workup. ?PCCM was consulted for help with evaluation and medical management ? ?Pertinent Medical History:  ? ?Past Medical History:  ?Diagnosis Date  ? Depression   ? Hypertension   ? Sleep apnea   ? doesn't wear CPAP  ? ?Significant Hospital Events: ?Including procedures, antibiotic start and stop dates in addition to other pertinent events   ?5/9 - Presented to [facility] for planned C1-C2 nerve sheath tumor resection/laminectomy. Hypotensive shortly after induction/intubation requiring case to be aborted. Extubated in OR. Transferred to ICU for further cardiac workup. ? ?Interim History / Subjective:  ?No overnight issues ?Patient is tolerating spontaneous breathing trial, will switch propofol and fentanyl infusion to Precedex and will try to extubate him on Precedex ? ?Objective:  ?Blood pressure 133/81, pulse 96, temperature 98.1 ?F (36.7 ?C), temperature source Axillary, resp. rate (!) 25, height 6' (1.829 m), weight (!)  154.2 kg, SpO2 97 %. ?CVP:  [13 mmHg] 13 mmHg  ?Vent Mode: PRVC ?FiO2 (%):  [50 %] 50 % ?Set Rate:  [18 bmp] 18 bmp ?Vt Set:  [620 mL] 620 mL ?PEEP:  [8 cmH20] 8 cmH20 ?Plateau Pressure:  [13 cmH20-20 cmH20] 13 cmH20  ? ?Intake/Output Summary (Last 24 hours) at 12/15/2021 1149 ?Last data filed at 12/15/2021 0800 ?[place] per 24 hour  ?Intake 5688.16 ml  ?Output 2205 ml  ?Net 3483.16 ml  ? ?Filed Weights  ? 12/15/2021 0950 12/14/21 0500 12/15/21 0500  ?Weight: (!) 147.4 kg (!) 149.6 kg (!) 154.2 kg  ? ?Physical Examination: ? Physical exam: ?General: Crtitically ill-appearing morbidly obese male, orally intubated ?HEENT: [place]/AT, eyes anicteric.  ETT and OGT in place ?Neuro: Awake, following commands, moving all 4 extremities ?Chest: Coarse breath sounds, no wheezes or rhonchi ?Heart: Irregularly irregular, tachycardic, no murmurs or gallops ?Abdomen: Soft, nontender, nondistended, bowel sounds present ?Skin: No rash ? ?Resolved Hospital Problem List:  ?Medication related Hypotension anesthesia  ? ?Assessment & Plan:  ?Sudden onset of choking ?Acute hypoxic respiratory failure ?Probable aspiration pneumonia ?Acute metabolic encephalopathy due to hypoxia ?Patient is tolerating spontaneous breathing trial again ?We will stop propofol and fentanyl ?Started on Precedex, will try to extubate him on Precedex so that he remains calm ?VAP bundle ?Continue on IV Unasyn ?Follow-up respiratory culture ? ?New diagnosis of atrial fibrillation with rapid ventricular response ?Acute on chronic HFpEF ?During episode of hypoxia yesterday patient went into A-fib with RVR, his heart rate was ranging in 200s ?His heart rate improved with amiodarone, last night he converted to sinus rhythm ?Continue IV amiodarone infusion ?X-ray chest is suggestive of pulmonary edema versus aspiration pneumonia ?Give 1 dose of IV  Lasix with good urine output ?BMP in progress ?Monitor intake and output ?Continue telemetry monitoring ? ?Morbid obesity ?OSA,  noncompliant with CPAP ?Patient refused CPAP overnight ?Encouraged to use CPAP every night to prevent further complications and pulmonary hypertension ? ?Schwannoma of C1-C2 nerve sheath ?S/p tumor resection ?We will get MRI spine with and without contrast per discussion with neurosurgery ? ?Hypokalemia/hypomagnesemia, resolved ?AKI ?Continue aggressive electrolyte supplement ?Monitor BMP and and electrolytes ?Avoid nephrotoxic agents as able ? ?Hypertension ?Home regimen: Norvasc, Lasix, Toprol-XL, valsartan-HCTZ ?Hold oral antihypertensive for now ? ?Best Practice: (right click and "Reselect all SmartList Selections" daily)  ? ?Per Primary Team ? ?Labs:  ?CBC: ?Recent Labs  ?Lab 01/02/2022 ?1356 12/30/2021 ?1604 12/12/21 ?0503 12/14/21 ?0459  ?WBC  --  10.8* 9.8 14.0*  ?HGB 14.6 14.6 14.4 13.4  ?HCT 43.0 42.9 43.0 39.2  ?MCV  --  86.3 86.2 86.3  ?PLT  --  291 256 250  ? ?Basic Metabolic Panel: ?Recent Labs  ?Lab 12/15/2021 ?1035 12/28/2021 ?1356 12/16/2021 ?1459 12/12/2021 ?1830 12/12/21 ?0503 12/14/21 ?0258 12/14/21 ?1712 12/15/21 ?5277  ?NA 138 140 138  --  140 138  --   --   ?K 3.2* 3.1* 3.1*  --  3.3* 3.8  --   --   ?CL 101  --  102  --  103 104  --   --   ?CO2 26  --  28  --  28 27  --   --   ?GLUCOSE 136*  --  154*  --  111* 144*  --   --   ?BUN 35*  --  31*  --  22 20  --   --   ?CREATININE 1.35*  --  1.54*  --  1.12 1.33*  --   --   ?CALCIUM 9.2  --  8.7*  --  8.8* 8.7*  --   --   ?MG  --   --   --  1.8 2.0 1.7 2.3 2.0  ?PHOS  --   --   --   --  2.9  --  2.5 3.4  ? ?GFR: ?Estimated Creatinine Clearance: 85.9 mL/min (A) (by C-G formula based on SCr of 1.33 mg/dL (H)). ?Recent Labs  ?Lab 12/12/2021 ?1604 12/12/21 ?0503 12/14/21 ?0459  ?WBC 10.8* 9.8 14.0*  ? ?Liver Function Tests: ?Recent Labs  ?Lab 12/06/2021 ?1459 12/12/21 ?0503  ?AST 27 23  ?ALT 30 23  ?ALKPHOS 63 53  ?BILITOT 1.0 1.0  ?PROT 6.5 6.6  ?ALBUMIN 3.3* 3.4*  ? ?No results for input(s): LIPASE, AMYLASE in the last 168 hours. ?No results for input(s): AMMONIA  in the last 168 hours. ? ?ABG: ?   ?Component Value Date/Time  ? PHART 7.209 (L) 12/31/2021 1356  ? PCO2ART 56.0 (H) 12/04/2021 1356  ? PO2ART 66 (L) 12/14/2021 1356  ? HCO3 22.3 01/01/2022 1356  ? TCO2 24 12/19/2021 1356  ? ACIDBASEDEF 6.0 (H) 12/10/2021 1356  ? O2SAT 87 12/08/2021 1356  ?  ? ?Coagulation Profile: ?No results for input(s): INR, PROTIME in the last 168 hours. ? ?Cardiac Enzymes: ?No results for input(s): CKTOTAL, CKMB, CKMBINDEX, TROPONINI in the last 168 hours. ? ?HbA1C: ?No results found for: HGBA1C ? ?CBG: ?Recent Labs  ?Lab 12/14/21 ?1936 12/14/21 ?2309 12/15/21 ?8242 12/15/21 ?3536 12/15/21 ?1110  ?GLUCAP 101* 126* 101* 115* 108*  ? ? ?  ?Total critical care time: 42 minutes ? ?Performed by: Jacky Kindle ?  ?Critical care time was exclusive of separately billable  procedures and treating other patients. ?  ?Critical care was necessary to treat or prevent imminent or life-threatening deterioration. ?  ?Critical care was time spent personally by me on the following activities: development of treatment plan with patient and/or surrogate as well as nursing, discussions with consultants, evaluation of patient's response to treatment, examination of patient, obtaining history from patient or surrogate, ordering and performing treatments and interventions, ordering and review of laboratory studies, ordering and review of radiographic studies, pulse oximetry and re-evaluation of patient's condition. ?  ?Jacky Kindle MD ?[facility] ?See Amion for pager ?If no response to pager, please call [phone] until 7pm ?After 7pm, Please call E-link [phone] ? ? ?

## 2021-12-15 NOTE — Procedures (Signed)
Extubation Procedure Note ? ?Patient Details:   ?Name: Adam Moreno ?DOB: 21-Aug-1955 ?MRN: [id number] ?  ?Airway Documentation:  ?  ?Vent end date: 12/15/21 Vent end time: 1055  ? ?Evaluation ? O2 sats: stable throughout ?Complications: No apparent complications ?Patient did tolerate procedure well. ?Bilateral Breath Sounds: Clear, Diminished ?  ?Yes ? ?Pt extubated per physician order. Pt suctioned via ETT and orally prior. Positive cuff leak. CCM MD at bedside for extubation. Upon extubation pt suctioned orally and placed on 4L nasal cannula. Pt with no stridor, able to speak name and give a good cough. RT will continue to monitor and be available as needed.  ? ?Sharla Kidney ?12/15/2021, 11:02 AM ? ?

## 2021-12-15 NOTE — Procedures (Signed)
Intubation Procedure Note ? ?Adam Moreno  ?[id number]  ?01-16-1956 ? ?Date:12/15/21  ?Time:1:10 PM  ? ?Provider Performing:Demecia Northway  ? ? ?Procedure: Intubation (00174) ? ?Indication(s) ?Respiratory Failure ? ?Consent ?Risks of the procedure as well as the alternatives and risks of each were explained to the patient and/or caregiver.  Consent for the procedure was obtained and is signed in the bedside chart ? ? ?Anesthesia ?Etomidate and Rocuronium ? ? ?Time Out ?Verified patient identification, verified procedure, site/side was marked, verified correct patient position, special equipment/implants available, medications/allergies/relevant history reviewed, required imaging and test results available. ? ? ?Sterile Technique ?Usual hand hygeine, masks, and gloves were used ? ? ?Procedure Description ?Patient positioned in bed supine.  Sedation given as noted above.  Patient was intubated with endotracheal tube using Glidescope.  View was Grade 1 full glottis .  Number of attempts was 1.  Colorimetric CO2 detector was consistent with tracheal placement. ? ? ?Complications/Tolerance ?None; patient tolerated the procedure well. ?Chest X-ray is ordered to verify placement. ? ? ?EBL ?Minimal ? ? ?Specimen(s) ?None ? ?

## 2021-12-15 NOTE — Progress Notes (Signed)
Patient remains intubated and sedated on fentanyl and propofol.  He is on amiodarone and has a heart rate of 66 and looks to be in sinus rhythm.  Reviewed his postoperative MRI and it looks quite good.  There looks to be complete resection of the tumor and the signal change in the upper cord is improved.  Appreciate excellent care from CCM ?

## 2021-12-15 NOTE — Progress Notes (Addendum)
After extubation patient kept asking " am I dying", multiple times he was reassured vital signs look okay, his oxygen saturation is fine, he was suctioned multiple times.  After 20 minutes post extubation he suddenly went into panic attack, stated he cannot breathe, his throat was checked, it was clear, he became severely agitated and stopped breathing became cyanotic, heart rate dropped down to 20s, his oxygen saturation went down to 30s, he was started on BVM with improvement in oxygen saturation and heart rate, post this event patient remained unresponsive, he was reintubated and placed on mechanical ventilation ? ?I think he has been having severe panic attacks after extubation that is leading to these type of events, that requires second time reintubation ? ?Spoke with patient's wife stated that because of severe anxiety and recurrent panic attacks unfortunately this episode keep ongoing and it can cause drastic event like cardiac arrest.  This time patient was extubated on Precedex to prevent agitation and anxiety but again this happened ? ?After discussion with patient's wife, ID of tracheostomy was brought up, I explained that this might be a solution that he is going into panic attacks frequently and it will prevent him from going into these episodes leading to hypoxia bradycardia and requiring reintubation.  Patient's wife is in agreement for tracheostomy, will perform tracheostomy tomorrow ? ? ?After 30 minutes of intubation patient became hypotensive, will start IV Levophed via peripheral line ?We will send troponins, will obtain EKG ? ? ? ?Additional critical care time: 36 minutes ? ?Performed by: Jacky Kindle ?  ?Critical care time was exclusive of separately billable procedures and treating other patients. ?  ?Critical care was necessary to treat or prevent imminent or life-threatening deterioration. ?  ?Critical care was time spent personally by me on the following activities: development of  treatment plan with patient and/or surrogate as well as nursing, discussions with consultants, evaluation of patient's response to treatment, examination of patient, obtaining history from patient or surrogate, ordering and performing treatments and interventions, ordering and review of laboratory studies, ordering and review of radiographic studies, pulse oximetry and re-evaluation of patient's condition. ?  ?Jacky Kindle MD ?[facility] ?See Amion for pager ?If no response to pager, please call [phone] until 7pm ?After 7pm, Please call E-link [phone] ? ? ? ? ?

## 2021-12-15 NOTE — Progress Notes (Signed)
Pt transported to MRI and back to 6R67 w/o complication. RT will cont to monitor. ?

## 2021-12-15 NOTE — Progress Notes (Signed)
PT Cancellation Note ? ?Patient Details ?Name: Adam Moreno ?MRN: [id number] ?DOB: 08/22/1955 ? ? ?Cancelled Treatment:    Reason Eval/Treat Not Completed: Patient not medically ready, noted pt extubated earlier today then had to be re-intubated.  Will attempt post trach when stable.  ? ? ?[name] ?12/15/2021, 4:41 PM ?Magda Kiel, PT ?[facility] Services ?FPULG:[phone] ?[phone] ?12/15/2021 ? ?

## 2021-12-16 ENCOUNTER — Inpatient Hospital Stay (HOSPITAL_COMMUNITY): Payer: BC Managed Care – PPO

## 2021-12-16 DIAGNOSIS — N179 Acute kidney failure, unspecified: Secondary | ICD-10-CM | POA: Diagnosis not present

## 2021-12-16 DIAGNOSIS — D334 Benign neoplasm of spinal cord: Secondary | ICD-10-CM | POA: Diagnosis not present

## 2021-12-16 DIAGNOSIS — J9601 Acute respiratory failure with hypoxia: Secondary | ICD-10-CM | POA: Diagnosis not present

## 2021-12-16 LAB — GLUCOSE, CAPILLARY
Glucose-Capillary: 132 mg/dL — ABNORMAL HIGH (ref 70–99)
Glucose-Capillary: 134 mg/dL — ABNORMAL HIGH (ref 70–99)
Glucose-Capillary: 130 mg/dL — ABNORMAL HIGH (ref 70–99)
Glucose-Capillary: 130 mg/dL — ABNORMAL HIGH (ref 70–99)
Glucose-Capillary: 116 mg/dL — ABNORMAL HIGH (ref 70–99)
Glucose-Capillary: 126 mg/dL — ABNORMAL HIGH (ref 70–99)

## 2021-12-16 IMAGING — DX DG CHEST 1V PORT
1 series · 1 of 1 positions shown · non-contrast
Comparison: [DATE]

CLINICAL DATA: Tracheostomy placement

EXAM:
PORTABLE CHEST 1 VIEW

[chest]
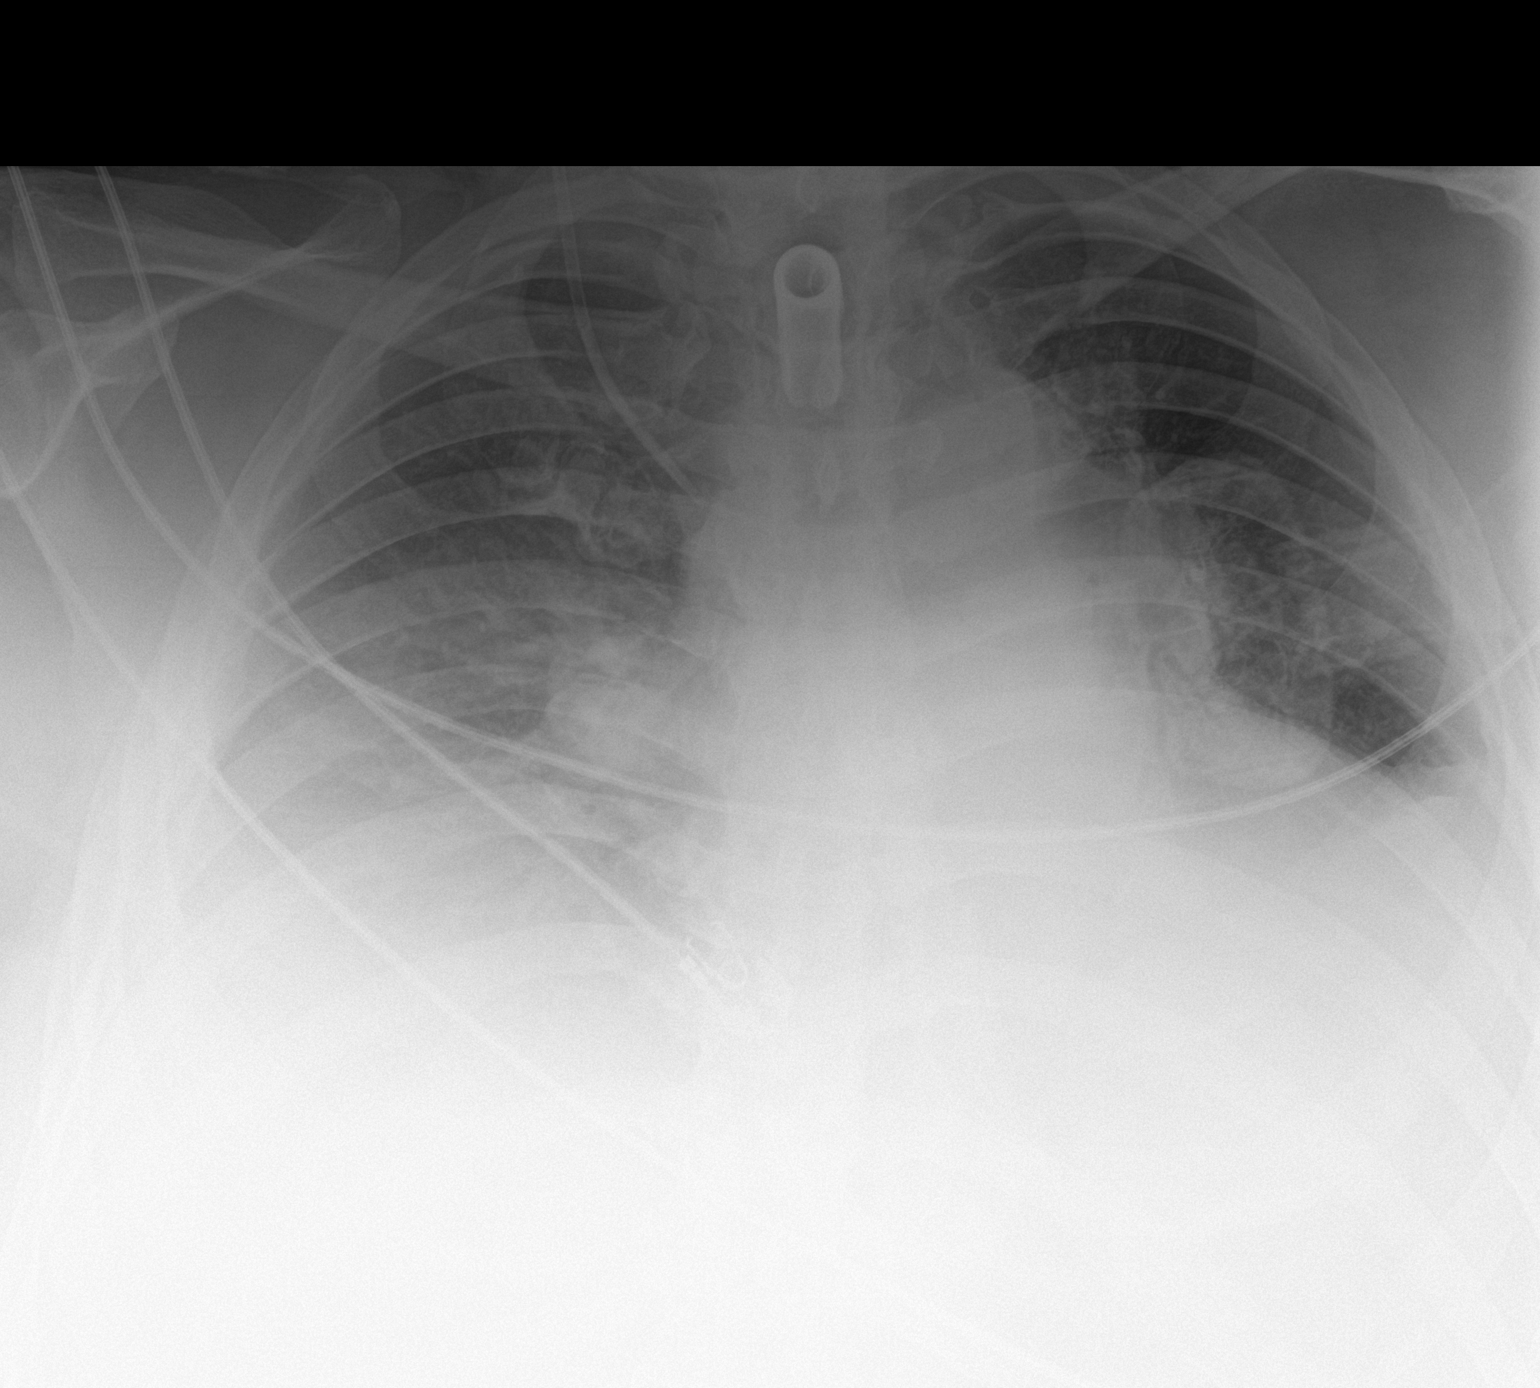

[1 of 1 positions shown; findings below may reference images not displayed]

FINDINGS: Single frontal view of the chest demonstrates interval removal of
the endotracheal and enteric catheter. Tracheostomy tube overlies
tracheal air column tip at thoracic inlet. Right internal jugular
catheter tip overlies superior vena cava. Cardiac silhouette is
enlarged but stable. Lung volumes are diminished, with veiling
opacities at the lung bases again noted, right greater than left. No
pneumothorax.
IMPRESSION: 1. No complication after tracheostomy tube placement.
2. Persistent bibasilar veiling opacities consistent with
consolidation and effusions.

## 2021-12-16 MED ORDER — MIDAZOLAM HCL 2 MG/2ML IJ SOLN
INTRAMUSCULAR | Status: AC
  Administered 2021-12-16: 2 mg
  Filled 2021-12-16: qty 2

## 2021-12-16 MED ORDER — FUROSEMIDE 10 MG/ML IJ SOLN
40.0000 mg | Freq: Once | INTRAMUSCULAR | Status: AC
  Administered 2021-12-16: 40 mg via INTRAVENOUS
  Filled 2021-12-16: qty 4

## 2021-12-16 MED ORDER — ROCURONIUM BROMIDE 10 MG/ML (PF) SYRINGE
PREFILLED_SYRINGE | INTRAVENOUS | Status: AC
  Administered 2021-12-16: 100 mg via INTRAVENOUS
  Filled 2021-12-16: qty 10

## 2021-12-16 MED ORDER — ORAL CARE MOUTH RINSE
15.0000 mL | OROMUCOSAL | Status: DC
  Administered 2021-12-16 – 2021-12-20 (×40): 15 mL via OROMUCOSAL

## 2021-12-16 MED ORDER — CHLORHEXIDINE GLUCONATE 0.12% ORAL RINSE (MEDLINE KIT)
15.0000 mL | Freq: Two times a day (BID) | OROMUCOSAL | Status: DC
  Administered 2021-12-16 – 2021-12-20 (×8): 15 mL via OROMUCOSAL

## 2021-12-16 MED ORDER — LIDOCAINE HCL (PF) 1 % IJ SOLN
INTRAMUSCULAR | Status: AC
  Administered 2021-12-16: 5 mL
  Filled 2021-12-16: qty 5

## 2021-12-16 MED ORDER — ETOMIDATE 2 MG/ML IV SOLN
INTRAVENOUS | Status: AC
  Administered 2021-12-16: 20 mg via INTRAVENOUS
  Filled 2021-12-16: qty 10

## 2021-12-16 MED ORDER — HEPARIN SODIUM (PORCINE) 5000 UNIT/ML IJ SOLN
5000.0000 [IU] | Freq: Three times a day (TID) | INTRAMUSCULAR | Status: DC
  Administered 2021-12-16 – 2021-12-18 (×6): 5000 [IU] via SUBCUTANEOUS
  Filled 2021-12-16 (×6): qty 1

## 2021-12-16 MED ORDER — PHENYLEPHRINE 80 MCG/ML (10ML) SYRINGE FOR IV PUSH (FOR BLOOD PRESSURE SUPPORT)
PREFILLED_SYRINGE | INTRAVENOUS | Status: AC
  Filled 2021-12-16: qty 10

## 2021-12-16 NOTE — Procedures (Signed)
Percutaneous Tracheostomy Procedure Note ? ? ?Adam Moreno  ?[id number]  ?11-21-1955 ? ?Date:12/16/21  ?Time:3:58 PM  ? ?Provider Performing:Genea Rheaume ? ?Procedure: Percutaneous Tracheostomy with Bronchoscopic Guidance (46503) ? ?Indication(s) ?Acute respiratory failure ? ?Consent ?Risks of the procedure as well as the alternatives and risks of each were explained to the patient and/or caregiver.  Consent for the procedure was obtained. ? ?Anesthesia ?Etomidate, Versed, Fentanyl, Vecuronium ? ? ?Time Out ?Verified patient identification, verified procedure, site/side was marked, verified correct patient position, special equipment/implants available, medications/allergies/relevant history reviewed, required imaging and test results available. ? ? ?Sterile Technique ?Maximal sterile technique including sterile barrier drape, hand hygiene, sterile gown, sterile gloves, mask, hair covering. ? ? ? ?Procedure Description ?Appropriate anatomy identified by palpation.  Patient's neck prepped and draped in sterile fashion.  1% lidocaine with epinephrine was used to anesthetize skin overlying neck.  1.5cm incision made and blunt dissection performed until tracheal rings could be easily palpated.   Then a size 8 Shiley tracheostomy was placed under bronchoscopic visualization using usual Seldinger technique and serial dilation.   Bronchoscope confirmed placement above the carina.  Tracheostomy was sutured in place with adhesive pad to protect skin under pressure.    Patient connected to ventilator. ? ? ?Complications/Tolerance ?None; patient tolerated the procedure well. ?Chest X-ray is ordered to confirm no post-procedural complication. ? ? ?EBL ?Minimal ? ? ?Specimen(s) ?None  ? ?

## 2021-12-16 NOTE — Progress Notes (Signed)
Remains intubated but much more awake and alert today and follows commands briskly with all 4 extremities with excellent strength.  Sounds like they are planning on tracheostomy today. ?

## 2021-12-16 NOTE — Progress Notes (Signed)
? ?NAME:  Adam Moreno, MRN:  [id number], DOB:  [birth year], LOS: 5 ?ADMISSION DATE:  12/10/2021 CONSULTATION DATE:  12/15/2021 ?REFERRING MD:  Zada Finders - NSGY CHIEF COMPLAINT:  Hypotension  ? ?History of Present Illness:  ?66 year old man who presented to [facility] 5/9 for planned laminectomy/surgical resection of C1-C2 nerve sheath tumor with NSGY (Dr. Zada Finders). 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 ? ?Patient presented for scheduled laminectomy/nerve sheath tumor resection 5/9. Post-induction/intubation prior to the procedure, patient became hypotensive and it was unclear if he had a pulse (true pulselessness versus instrumentation). Epi was administered x 1 and a short course of CPR was completed with return of palpable pulse. Case was aborted. Patient was successfully extubated in the OR with appropriate mental status. Placed on Neosynephrine for BP support. Patient was transferred to ICU for further workup. ?PCCM was consulted for help with evaluation and medical management ? ?Pertinent Medical History:  ? ?Past Medical History:  ?Diagnosis Date  ? Depression   ? Hypertension   ? Sleep apnea   ? doesn't wear CPAP  ? ?Significant Hospital Events: ?Including procedures, antibiotic start and stop dates in addition to other pertinent events   ?5/9 - Presented to [facility] for planned C1-C2 nerve sheath tumor resection/laminectomy. Hypotensive shortly after induction/intubation requiring case to be aborted. Extubated in OR. Transferred to ICU for further cardiac workup. ? ?Interim History / Subjective:  ?No overnight issues ?Patient remained on Precedex and fentanyl infusion ? ?Objective:  ?Blood pressure 95/79, pulse 60, temperature 99.7 ?F (37.6 ?C), temperature source Axillary, resp. rate (!) 22, height 6' (1.829 m), weight (!) 154.2 kg, SpO2 99 %. ?   ?Vent Mode: PRVC ?FiO2 (%):  [40 %-60 %] 40 % ?Set Rate:  [18 bmp] 18  bmp ?Vt Set:  [620 mL] 620 mL ?PEEP:  [8 cmH20] 8 cmH20 ?Plateau Pressure:  [16 cmH20-22 cmH20] 18 cmH20  ? ?Intake/Output Summary (Last 24 hours) at 12/16/2021 1328 ?Last data filed at 12/16/2021 1000 ?Barkey per 24 hour  ?Intake 2101.28 ml  ?Output 1035 ml  ?Net 1066.28 ml  ? ?Filed Weights  ? 12/26/2021 0950 12/14/21 0500 12/15/21 0500  ?Weight: (!) 147.4 kg (!) 149.6 kg (!) 154.2 kg  ? ?Physical Examination: ?General: Crtitically ill-appearing morbidly obese male, orally intubated ?HEENT: [place]/AT, eyes anicteric.  ETT and OGT in place ?Neuro: Awake, following commands, moving all 4 extremities ?Chest: Coarse breath sounds, no wheezes or rhonchi ?Heart: Regular rate and rhythm, no murmurs or gallops ?Abdomen: Soft, nontender, nondistended, bowel sounds present ?Skin: No rash ? ?Resolved Hospital Problem List:  ?Medication related Hypotension anesthesia  ?Hypokalemia/hypomagnesemia  ? ?Assessment & Plan:  ?Sudden onset of choking versus severe panic attacks ?Acute hypoxic respiratory failure ?Probable aspiration pneumonia ?Acute metabolic encephalopathy due to hypoxia ?Patient was extubated yesterday again after tolerating spontaneous breathing trial, I stayed in patient room, he kept on asking "am I dying", he was reassured several times that he is doing well with normal vital signs ?He was found anxious ?He remained on Precedex infusion, dose was increased to 1.2 mics per KG per minute ?Over the next 10 minutes he progressively became more and more anxious and stating that he cannot breathe, his throat was checked, it was clear his oxygen saturation was in the 90s on 2 L nasal cannula, suddenly he just stopped breathing and kept on saying I cannot breathe, his oxygen saturation dropped again to low 30s, he became unresponsive,  BVM was done and he was reintubated again ?I think he has severe anxiety and had panic attacks 2 days ago after extubation ?Spoke with patient's wife and primary care team considering he almost  had cardiac arrest due to hypoxia twice, he is at high risk of extubation trial again ?After discussion decision was to proceed with tracheostomy ?Currently he is on full mechanical ventilatory support ?Continue lung protective ventilation ?Continue IV Unasyn ?Minimize sedation with RASS goal 0/-1, currently on Precedex and fentanyl infusion ? ?New diagnosis of atrial fibrillation with rapid ventricular response, converted to sinus rhythm ?Acute on chronic HFpEF ?Patient briefly went into A-fib with RVR day before yesterday during this respiratory arrest ?Now he converted back to sinus rhythm ?He is on amiodarone, will continue for now ?His A-fib episode was very brief I do not think he will need anticoagulation for stroke prophylaxis as it was a stress related episode  ?X-ray chest is suggestive of pulmonary edema versus aspiration pneumonia ?Continue gentle diuretics ?Follow-up BMP ?Monitor intake and output ?Continue telemetry monitoring ? ?Morbid obesity ?OSA, noncompliant with CPAP ?Dietitian is following ? ?Schwannoma of C1-C2 nerve sheath ?S/p tumor resection ?MRI brain showed stable postop changes ?Management per neurosurgery ? ?AKI ?Serum creatinine is slightly elevated ?Monitor BMP and and electrolytes ?Avoid nephrotoxic agents as able ? ?Hypertension ?Home regimen: Norvasc, Lasix, Toprol-XL, valsartan-HCTZ ?Hold oral antihypertensive for now ? ?Best Practice: (right click and "Reselect all SmartList Selections" daily)  ? ?Per Primary Team ? ?Labs:  ?CBC: ?Recent Labs  ?Lab 12/05/2021 ?1356 12/14/2021 ?1604 12/12/21 ?0503 12/14/21 ?0459  ?WBC  --  10.8* 9.8 14.0*  ?HGB 14.6 14.6 14.4 13.4  ?HCT 43.0 42.9 43.0 39.2  ?MCV  --  86.3 86.2 86.3  ?PLT  --  291 256 250  ? ?Basic Metabolic Panel: ?Recent Labs  ?Lab 12/10/2021 ?1035 12/31/2021 ?1356 12/12/2021 ?1459 12/30/2021 ?1830 12/12/21 ?0503 12/14/21 ?3244 12/14/21 ?1712 12/15/21 ?0102 12/15/21 ?1127  ?NA 138 140 138  --  140 138  --  141  --   ?K 3.2* 3.1* 3.1*  --   3.3* 3.8  --  3.7  --   ?CL 101  --  102  --  103 104  --  105  --   ?CO2 26  --  28  --  28 27  --  29  --   ?GLUCOSE 136*  --  154*  --  111* 144*  --  159*  --   ?BUN 35*  --  31*  --  22 20  --  23  --   ?CREATININE 1.35*  --  1.54*  --  1.12 1.33*  --  1.36*  --   ?CALCIUM 9.2  --  8.7*  --  8.8* 8.7*  --  7.9*  --   ?MG  --   --   --    < > 2.0 1.7 2.3 2.0 1.9  ?PHOS  --   --   --   --  2.9  --  2.5 3.4 4.1  ? < > = values in this interval not displayed.  ? ?GFR: ?Estimated Creatinine Clearance: 84 mL/min (A) (by C-G formula based on SCr of 1.36 mg/dL (H)). ?Recent Labs  ?Lab 12/16/2021 ?1604 12/12/21 ?0503 12/14/21 ?0459  ?WBC 10.8* 9.8 14.0*  ? ?Liver Function Tests: ?Recent Labs  ?Lab 12/19/2021 ?1459 12/12/21 ?0503  ?AST 27 23  ?ALT 30 23  ?ALKPHOS 63 53  ?BILITOT 1.0 1.0  ?PROT 6.5  6.6  ?ALBUMIN 3.3* 3.4*  ? ?No results for input(s): LIPASE, AMYLASE in the last 168 hours. ?No results for input(s): AMMONIA in the last 168 hours. ? ?ABG: ?   ?Component Value Date/Time  ? PHART 7.209 (L) 12/04/2021 1356  ? PCO2ART 56.0 (H) 12/19/2021 1356  ? PO2ART 66 (L) 12/10/2021 1356  ? HCO3 22.3 12/03/2021 1356  ? TCO2 24 12/28/2021 1356  ? ACIDBASEDEF 6.0 (H) 12/26/2021 1356  ? O2SAT 87 12/27/2021 1356  ?  ? ?Coagulation Profile: ?No results for input(s): INR, PROTIME in the last 168 hours. ? ?Cardiac Enzymes: ?No results for input(s): CKTOTAL, CKMB, CKMBINDEX, TROPONINI in the last 168 hours. ? ?HbA1C: ?No results found for: HGBA1C ? ?CBG: ?Recent Labs  ?Lab 12/15/21 ?1909 12/15/21 ?2318 12/16/21 ?4259 12/16/21 ?0803 12/16/21 ?1136  ?GLUCAP 141* 137* 132* 134* 130*  ? ? ?  ?Total critical care time: 33 minutes ? ?Performed by: Jacky Kindle ?  ?Critical care time was exclusive of separately billable procedures and treating other patients. ?  ?Critical care was necessary to treat or prevent imminent or life-threatening deterioration. ?  ?Critical care was time spent personally by me on the following activities: development  of treatment plan with patient and/or surrogate as well as nursing, discussions with consultants, evaluation of patient's response to treatment, examination of patient, obtaining history from patient or surrog

## 2021-12-16 NOTE — Procedures (Signed)
Diagnostic Bronchoscopy ? ?Adam Moreno  ?[id number]  ?20-May-1956 ? ?Date:12/16/21  ?Time:6:36 PM  ? ?Provider Performing:Charly Hunton  ? ?Procedure: Diagnostic Bronchoscopy (75916) ? ?Indication(s) ?Assist with direct visualization of tracheostomy placement ? ?Consent ?Risks of the procedure as well as the alternatives and risks of each were explained to the patient and/or caregiver.  Consent for the procedure was obtained. ? ? ?Anesthesia ?See separate tracheostomy note ? ? ?Time Out ?Verified patient identification, verified procedure, site/side was marked, verified correct patient position, special equipment/implants available, medications/allergies/relevant history reviewed, required imaging and test results available. ? ? ?Sterile Technique ?Usual hand hygiene, masks, gowns, and gloves were used ? ? ?Procedure Description ?Bronchoscope advanced through endotracheal tube and into airway.  After suctioning out tracheal secretions, bronchoscope used to provide direct visualization of tracheostomy placement. ? ? ?Complications/Tolerance ?None; patient tolerated the procedure well. ? ? ?EBL ?None ? ?Specimen(s) ?None ? ?Adam Brood, MD [facility] ?ICU Physician ?[place]  ?Pager: [phone] ?Or Epic Secure Chat ?After hours: [phone]. ? ?12/16/2021, 6:36 PM ? ? ? ? ?

## 2021-12-17 ENCOUNTER — Inpatient Hospital Stay: Payer: BC Managed Care – PPO | Attending: Neurological Surgery

## 2021-12-17 ENCOUNTER — Inpatient Hospital Stay (HOSPITAL_COMMUNITY): Payer: BC Managed Care – PPO

## 2021-12-17 ENCOUNTER — Inpatient Hospital Stay: Payer: Self-pay

## 2021-12-17 DIAGNOSIS — J9601 Acute respiratory failure with hypoxia: Secondary | ICD-10-CM | POA: Diagnosis not present

## 2021-12-17 DIAGNOSIS — Z930 Tracheostomy status: Secondary | ICD-10-CM | POA: Diagnosis not present

## 2021-12-17 DIAGNOSIS — Z9911 Dependence on respirator [ventilator] status: Secondary | ICD-10-CM

## 2021-12-17 DIAGNOSIS — D334 Benign neoplasm of spinal cord: Secondary | ICD-10-CM | POA: Diagnosis not present

## 2021-12-17 LAB — CBC WITH DIFFERENTIAL/PLATELET
Abs Immature Granulocytes: 0.03 10*3/uL (ref 0.00–0.07)
Basophils Absolute: 0 10*3/uL (ref 0.0–0.1)
Basophils Relative: 0 %
Eosinophils Absolute: 0.2 10*3/uL (ref 0.0–0.5)
Eosinophils Relative: 3 %
HCT: 33.9 % — ABNORMAL LOW (ref 39.0–52.0)
Hemoglobin: 11.2 g/dL — ABNORMAL LOW (ref 13.0–17.0)
Immature Granulocytes: 0 %
Lymphocytes Relative: 14 %
Lymphs Abs: 1.1 10*3/uL (ref 0.7–4.0)
MCH: 29.2 pg (ref 26.0–34.0)
MCHC: 33 g/dL (ref 30.0–36.0)
MCV: 88.5 fL (ref 80.0–100.0)
Monocytes Absolute: 0.8 10*3/uL (ref 0.1–1.0)
Monocytes Relative: 10 %
Neutro Abs: 5.7 10*3/uL (ref 1.7–7.7)
Neutrophils Relative %: 73 %
Platelets: 183 10*3/uL (ref 150–400)
RBC: 3.83 MIL/uL — ABNORMAL LOW (ref 4.22–5.81)
RDW: 13.2 % (ref 11.5–15.5)
WBC: 7.9 10*3/uL (ref 4.0–10.5)
nRBC: 0 % (ref 0.0–0.2)

## 2021-12-17 LAB — GLUCOSE, CAPILLARY
Glucose-Capillary: 127 mg/dL — ABNORMAL HIGH (ref 70–99)
Glucose-Capillary: 120 mg/dL — ABNORMAL HIGH (ref 70–99)
Glucose-Capillary: 116 mg/dL — ABNORMAL HIGH (ref 70–99)
Glucose-Capillary: 106 mg/dL — ABNORMAL HIGH (ref 70–99)
Glucose-Capillary: 121 mg/dL — ABNORMAL HIGH (ref 70–99)
Glucose-Capillary: 149 mg/dL — ABNORMAL HIGH (ref 70–99)

## 2021-12-17 LAB — BASIC METABOLIC PANEL
Anion gap: 6 (ref 5–15)
BUN: 16 mg/dL (ref 8–23)
CO2: 28 mmol/L (ref 22–32)
Calcium: 7.9 mg/dL — ABNORMAL LOW (ref 8.9–10.3)
Chloride: 105 mmol/L (ref 98–111)
Creatinine, Ser: 1.17 mg/dL (ref 0.61–1.24)
GFR, Estimated: >60 mL/min (ref >60)
Glucose, Bld: 123 mg/dL — ABNORMAL HIGH (ref 70–99)
Potassium: 3 mmol/L — ABNORMAL LOW (ref 3.5–5.1)
Sodium: 139 mmol/L (ref 135–145)

## 2021-12-17 LAB — MRSA NEXT GEN BY PCR, NASAL: MRSA by PCR Next Gen: NOT DETECTED

## 2021-12-17 LAB — MAGNESIUM: Magnesium: 1.8 mg/dL (ref 1.7–2.4)

## 2021-12-17 LAB — PHOSPHORUS: Phosphorus: 2.8 mg/dL (ref 2.5–4.6)

## 2021-12-17 IMAGING — DX DG ABD PORTABLE 1V
1 series · 1 of 1 positions shown · non-contrast
Comparison: None Available.

CLINICAL DATA: NG tube placement.

EXAM:
PORTABLE ABDOMEN - 1 VIEW

[abdomen]
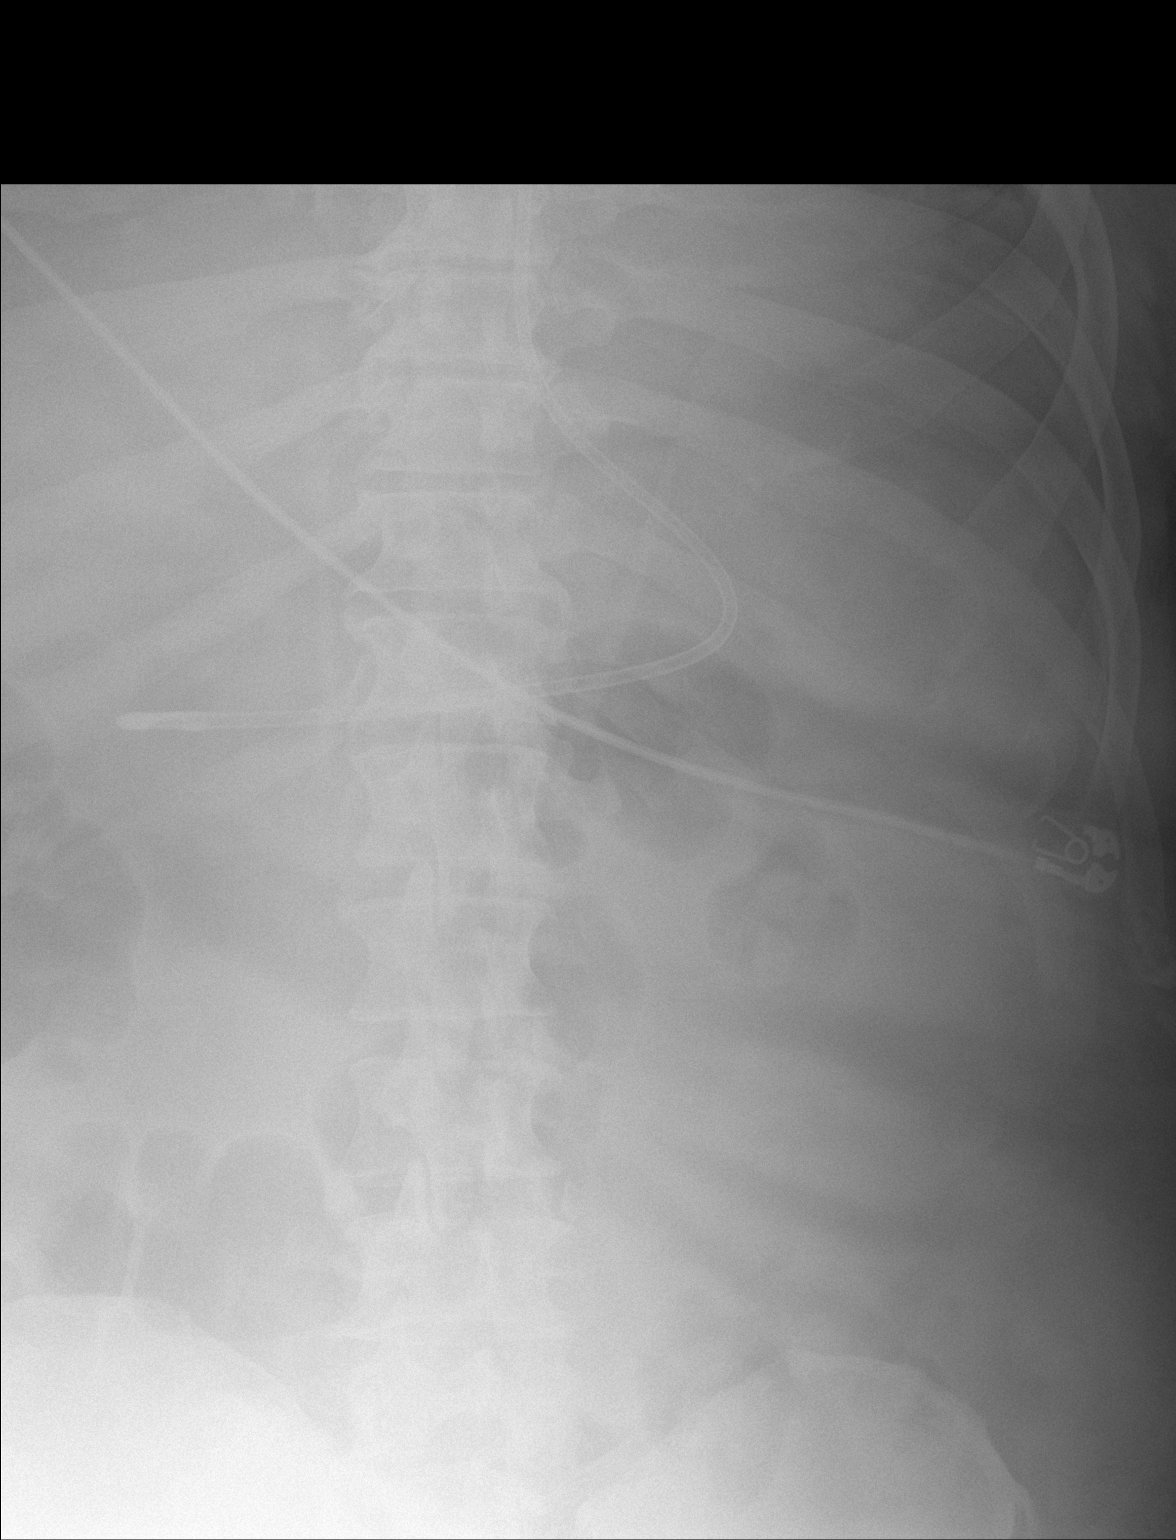

[1 of 1 positions shown; findings below may reference images not displayed]

FINDINGS: Small bore feeding tube terminates in the distal stomach. Bowel gas
pattern is unremarkable. Heart is enlarged. Small effusions are
present.
IMPRESSION: Small bore feeding tube terminates in the distal stomach.

## 2021-12-17 MED ORDER — SODIUM CHLORIDE 0.9% FLUSH: 10.0000 mL | INTRAVENOUS | Status: DC | PRN

## 2021-12-17 MED ORDER — IPRATROPIUM-ALBUTEROL 0.5-2.5 (3) MG/3ML IN SOLN
3.0000 mL | Freq: Four times a day (QID) | RESPIRATORY_TRACT | Status: DC
  Administered 2021-12-17 – 2021-12-20 (×12): 3 mL via RESPIRATORY_TRACT
  Filled 2021-12-17 (×12): qty 3

## 2021-12-17 MED ORDER — VANCOMYCIN HCL IN DEXTROSE 1-5 GM/200ML-% IV SOLN
1000.0000 mg | Freq: Two times a day (BID) | INTRAVENOUS | Status: DC
  Administered 2021-12-17: 1000 mg via INTRAVENOUS
  Filled 2021-12-17 (×2): qty 200

## 2021-12-17 MED ORDER — SODIUM CHLORIDE 0.9 % IV SOLN
2.0000 g | Freq: Three times a day (TID) | INTRAVENOUS | Status: DC
  Administered 2021-12-17 – 2021-12-20 (×9): 2 g via INTRAVENOUS
  Filled 2021-12-17 (×9): qty 12.5

## 2021-12-17 MED ORDER — MAGNESIUM SULFATE 2 GM/50ML IV SOLN
2.0000 g | Freq: Once | INTRAVENOUS | Status: AC
  Administered 2021-12-17: 2 g via INTRAVENOUS
  Filled 2021-12-17: qty 50

## 2021-12-17 MED ORDER — MIDAZOLAM HCL 2 MG/2ML IJ SOLN
2.0000 mg | INTRAMUSCULAR | Status: DC | PRN
  Administered 2021-12-17 – 2021-12-18 (×3): 2 mg via INTRAVENOUS
  Filled 2021-12-17 (×2): qty 2

## 2021-12-17 MED ORDER — CLONAZEPAM 1 MG PO TABS
2.0000 mg | ORAL_TABLET | Freq: Two times a day (BID) | ORAL | Status: DC
  Administered 2021-12-17 – 2021-12-19 (×5): 2 mg
  Filled 2021-12-17 (×5): qty 2

## 2021-12-17 MED ORDER — HYDROMORPHONE BOLUS VIA INFUSION
0.2500 mg | INTRAVENOUS | Status: DC | PRN
  Administered 2021-12-17 – 2021-12-18 (×6): 1 mg via INTRAVENOUS

## 2021-12-17 MED ORDER — QUETIAPINE FUMARATE 25 MG PO TABS
50.0000 mg | ORAL_TABLET | Freq: Two times a day (BID) | ORAL | Status: DC
Start: 2021-12-17 — End: 2021-12-18
  Administered 2021-12-17 (×2): 50 mg
  Filled 2021-12-17 (×2): qty 2

## 2021-12-17 MED ORDER — SODIUM CHLORIDE 0.9% FLUSH
10.0000 mL | Freq: Two times a day (BID) | INTRAVENOUS | Status: DC
  Administered 2021-12-17 – 2021-12-19 (×5): 10 mL

## 2021-12-17 MED ORDER — SODIUM CHLORIDE 0.9 % IV SOLN
0.5000 mg/h | INTRAVENOUS | Status: DC
  Administered 2021-12-17: 0.5 mg/h via INTRAVENOUS
  Administered 2021-12-18: 3 mg/h via INTRAVENOUS
  Filled 2021-12-17 (×3): qty 5

## 2021-12-17 MED ORDER — DOCUSATE SODIUM 50 MG/5ML PO LIQD: 100.0000 mg | Freq: Two times a day (BID) | ORAL | Status: DC

## 2021-12-17 MED ORDER — VANCOMYCIN HCL 10 G IV SOLR
2500.0000 mg | Freq: Once | INTRAVENOUS | Status: AC
  Administered 2021-12-17: 2500 mg via INTRAVENOUS
  Filled 2021-12-17: qty 2500

## 2021-12-17 MED ORDER — FUROSEMIDE 10 MG/ML IJ SOLN
40.0000 mg | Freq: Once | INTRAMUSCULAR | Status: AC
  Administered 2021-12-17: 40 mg via INTRAVENOUS
  Filled 2021-12-17: qty 4

## 2021-12-17 MED ORDER — POTASSIUM CHLORIDE 10 MEQ/50ML IV SOLN
10.0000 meq | INTRAVENOUS | Status: AC
  Administered 2021-12-17 (×6): 10 meq via INTRAVENOUS
  Filled 2021-12-17 (×6): qty 50

## 2021-12-17 MED ORDER — MIDAZOLAM HCL 2 MG/2ML IJ SOLN
INTRAMUSCULAR | Status: AC
Start: 2021-12-17 — End: 2021-12-17
  Filled 2021-12-17: qty 2

## 2021-12-17 MED ORDER — HYDROMORPHONE HCL 1 MG/ML IJ SOLN: 1.0000 mg | Freq: Once | INTRAMUSCULAR | Status: DC

## 2021-12-17 MED ORDER — PROPOFOL 1000 MG/100ML IV EMUL
0.0000 ug/kg/min | INTRAVENOUS | Status: DC
  Administered 2021-12-17: 10 ug/kg/min via INTRAVENOUS
  Filled 2021-12-17 (×2): qty 100

## 2021-12-17 NOTE — Progress Notes (Signed)
? ?NAME:  Adam Moreno, MRN:  [id number], DOB:  1956/02/11, LOS: 6 ?ADMISSION DATE:  12/17/2021 CONSULTATION DATE:  12/08/2021 ?REFERRING MD:  Zada Finders - NSGY CHIEF COMPLAINT:  Hypotension  ? ?History of Present Illness:  ?66 year old man who presented to [facility] 5/9 for planned laminectomy/surgical resection of C1-C2 nerve sheath tumor with NSGY (Dr. Zada Finders). 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 ? ?Patient presented for scheduled laminectomy/nerve sheath tumor resection 5/9. Post-induction/intubation prior to the procedure, patient became hypotensive and it was unclear if he had a pulse (true pulselessness versus instrumentation). Epi was administered x 1 and a short course of CPR was completed with return of palpable pulse. Case was aborted. Patient was successfully extubated in the OR with appropriate mental status. Placed on Neosynephrine for BP support. Patient was transferred to ICU for further workup. ?PCCM was consulted for help with evaluation and medical management ? ?Pertinent Medical History:  ? ?Past Medical History:  ?Diagnosis Date  ? Depression   ? Hypertension   ? Sleep apnea   ? doesn't wear CPAP  ? ?Significant Hospital Events: ?Including procedures, antibiotic start and stop dates in addition to other pertinent events   ?5/9 - Presented to [facility] for planned C1-C2 nerve sheath tumor resection/laminectomy. Hypotensive shortly after induction/intubation requiring case to be aborted. Extubated in OR. Transferred to ICU for further cardiac workup. ?5/12 extubated, Afib, cardiac arrest, emergently reintubated ?5/14 extubated, desaturated to 30s%, emergently reintubated> trached ?5/15 started on empiric antibiotics ? ?Interim History / Subjective:  ?Today he denies complaints other than anxiety. Tmax 100.7. ? ?Objective:  ?Blood pressure 126/63, pulse (!) 58, temperature 98.9 ?F (37.2 ?C), temperature source  Axillary, resp. rate (!) 21, height 6' (1.829 m), weight (!) 154.2 kg, SpO2 95 %. ?   ?Vent Mode: PRVC ?FiO2 (%):  [40 %-80 %] 50 % ?Set Rate:  [18 bmp] 18 bmp ?Vt Set:  [620 mL-630 mL] 630 mL ?PEEP:  [8 cmH20] 8 cmH20 ?Plateau Pressure:  [17 cmH20-21 cmH20] 18 cmH20  ? ?Intake/Output Summary (Last 24 hours) at 12/17/2021 0912 ?Last data filed at 12/17/2021 0800 ?Skog per 24 hour  ?Intake 2873.86 ml  ?Output 2400 ml  ?Net 473.86 ml  ? ? ?Filed Weights  ? 12/07/2021 0950 12/14/21 0500 12/15/21 0500  ?Weight: (!) 147.4 kg (!) 149.6 kg (!) 154.2 kg  ? ?Physical Examination: ?General: critically ill appearing man lying in bed in NAD ?HEENT: /AT, eyes anicteric ?Neck: trach in place, no active bleeding or erythema ?Neuro: awake and alert, moving all extremities, nodding to answer questions ?Chest: mild wheezing, no rhales. Synchronous with MV. ?Heart: S1S2, RRR ?Abdomen: obese, soft, NT ?Skin: warm, dry, no rashes ? ?K+ 3.0 ?H/H 11.2/33.9 ? ? ?Resolved Hospital Problem List:  ?Medication related Hypotension anesthesia  ?Hypokalemia/hypomagnesemia  ? ?Assessment & Plan:  ? ?TF, cortrak ?Clonazepam ?PT, OT ?Stop amiodarone, back in NSR ?Switch fent to dilaudid ?Add back propofol with precedex ?Seroquel and klonopin enterally ?Antibiotics for pneumonia, trach aspirate> brown secretions ? ?Acute hypoxic respiratory failure; failed extubation x 2. With degree of desaturation he likely has undiagnosed pulmonary issues contributing to his episodes, possibly triggering some of his anxiety. ?Probable aspiration pneumonia ?-LTVV ?-VAP prevention protocol ?-PAD protocol ?-vent weaning ?-routine trach care; remove sutures on 5/21 ?-cefepime, vanc empirically for antibiotics  ? ?Acute metabolic encephalopathy due to hypoxia ?Anxiety ?-sedation per PAD protocol ?-adding clonazepam ? ?New diagnosis of atrial fibrillation with rapid ventricular  response, converted to sinus rhythm ?Acute on chronic HFpEF ?-d/c amiodarone now that back in  sinus-- was likely due to stress response ?-no need for [facility] unless recurrent Afib ?-tele monitoring ?-diuresis ? ?Morbid obesity ?OSA, noncompliant with CPAP ?-appreciate RD recommendations for TF ?-cortrak today ? ?Schwannoma of C1-C2 nerve sheath, s/p C1-C2 laminectomy ?-PT, OT, SLP ?-con't to monitor rehab progress ? ?AKI ?-monitor ?-strict I/O ?-renally dose meds, avoid nephrotoxic meds ? ?Hypokalemia ?-repleted ? ?Anemia due to critical illness ?-transfuse for Hb <7 or hemodynamically significant bleeding ? ?Hypertension ?-holding home meds for now> PTA meds amlodipine, lasix, metoprolol succ, valsartan-HCTZ ? ? ?Best Practice: (right click and "Reselect all SmartList Selections" daily)  ?Diet: TF ?GI: PPI ?DVT: heparin ?Family updates:  wife updated at bedside today ?Remove foley ?Remove CVC today; needs PIVs ? ?Labs:  ?CBC: ?Recent Labs  ?Lab 12/10/2021 ?1356 12/08/2021 ?1604 12/12/21 ?0503 12/14/21 ?7341 12/17/21 ?0636  ?WBC  --  10.8* 9.8 14.0* 7.9  ?NEUTROABS  --   --   --   --  5.7  ?HGB 14.6 14.6 14.4 13.4 11.2*  ?HCT 43.0 42.9 43.0 39.2 33.9*  ?MCV  --  86.3 86.2 86.3 88.5  ?PLT  --  291 256 250 183  ? ? ?Basic Metabolic Panel: ?Recent Labs  ?Lab 12/29/2021 ?1459 12/03/2021 ?1830 12/12/21 ?0503 12/14/21 ?9379 12/14/21 ?1712 12/15/21 ?0240 12/15/21 ?1127 12/17/21 ?0636  ?NA 138  --  140 138  --  141  --  139  ?K 3.1*  --  3.3* 3.8  --  3.7  --  3.0*  ?CL 102  --  103 104  --  105  --  105  ?CO2 28  --  28 27  --  29  --  28  ?GLUCOSE 154*  --  111* 144*  --  159*  --  123*  ?BUN 31*  --  22 20  --  23  --  16  ?CREATININE 1.54*  --  1.12 1.33*  --  1.36*  --  1.17  ?CALCIUM 8.7*  --  8.8* 8.7*  --  7.9*  --  7.9*  ?MG  --    < > 2.0 1.7 2.3 2.0 1.9 1.8  ?PHOS  --   --  2.9  --  2.5 3.4 4.1 2.8  ? < > = values in this interval not displayed.  ? ? ?GFR: ?Estimated Creatinine Clearance: 97.6 mL/min (by C-G formula based on SCr of 1.17 mg/dL). ?Recent Labs  ?Lab 12/10/2021 ?1604 12/12/21 ?0503 12/14/21 ?9735  12/17/21 ?0636  ?WBC 10.8* 9.8 14.0* 7.9  ? ? ?Liver Function Tests: ?Recent Labs  ?Lab 12/15/2021 ?1459 12/12/21 ?0503  ?AST 27 23  ?ALT 30 23  ?ALKPHOS 63 53  ?BILITOT 1.0 1.0  ?PROT 6.5 6.6  ?ALBUMIN 3.3* 3.4*  ? ? ?This patient is critically ill with multiple organ system failure which requires frequent high complexity decision making, assessment, support, evaluation, and titration of therapies. This was completed through the application of advanced monitoring technologies and extensive interpretation of multiple databases. During this encounter critical care time was devoted to patient care services described in this note for 46 minutes. ? ?Julian Hy, DO 12/17/21 6:03 PM ?[facility] ? ?

## 2021-12-17 NOTE — Evaluation (Signed)
Physical Therapy Evaluation ?Patient Details ?Name: Adam Moreno ?MRN: [id number] ?DOB: 11/06/1955 ?Today's Date: 12/17/2021 ? ?History of Present Illness ? 66 y.o. 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 5/11 s/p uneventful C1/C2 laminectomies and resection of tumor, prelim path schwannoma, 5/12 Abx for PNA 5/12 & 5/13 extubated and reintubated x2, 5/14 s/p trach, MRI w/ GTR, improved cord mass effect & dec'd signal change.  ?Clinical Impression ? Patient presents with decreased mobility due to deficits listed in PT problem list.  Currently on significant amount of meds for anxiety/agitation and on vent via trach, but still able to assist to come sit EOB and to move all 4 extremities well.  Seems to indicated numbness in hands and feet.  Patient's previous functional status unknown as wife not in the room and pt unable to state.  Feel he will benefit from skilled PT in the acute setting and hopeful for progression to HHPT at d/c pending resolution of respiratory issues.  ?   ? ?Recommendations for follow up therapy are one component of a multi-disciplinary discharge planning process, led by the attending physician.  Recommendations may be updated based on patient status, additional functional criteria and insurance authorization. ? ?Follow Up Recommendations Home health PT ? ?  ?Assistance Recommended at Discharge Frequent or constant Supervision/Assistance  ?Patient can return home with the following ? A lot of help with bathing/dressing/bathroom;Assistance with cooking/housework;Assist for transportation;Help with stairs or ramp for entrance;A lot of help with walking and/or transfers ? ?  ?Equipment Recommendations Other (comment) (TBA once mobility OOB and home equipment confirmed)  ?Recommendations for Other Services ?    ?  ?Functional Status Assessment Patient  has had a recent decline in their functional status and demonstrates the ability to make significant improvements in function in a reasonable and predictable amount of time.  ? ?  ?Precautions / Restrictions Precautions ?Precautions: Fall ?Precaution Comments: trach ?Restrictions ?Weight Bearing Restrictions: No  ? ?  ? ?Mobility ? Bed Mobility ?Overal bed mobility: Needs Assistance ?Bed Mobility: Supine to Sit, Sit to Supine ?  ?  ?Supine to sit: Mod assist, +2 for safety/equipment, HOB elevated ?Sit to supine: +2 for safety/equipment, Mod assist ?  ?General bed mobility comments: able to move legs off bed and needed assist of 2 to lift trunk safely with lines/vent tubing, etc; to supine assist for legs and trunk and repositioning once supine ?  ? ?Transfers ?  ?  ?  ?  ?  ?  ?  ?  ?  ?General transfer comment: NT, pt sleepy and anxious at the same time, not safe ?  ? ?Ambulation/Gait ?  ?  ?  ?  ?  ?  ?  ?  ? ?Stairs ?  ?  ?  ?  ?  ? ?Wheelchair Mobility ?  ? ?Modified Rankin (Stroke Patients Only) ?  ? ?  ? ?Balance Overall balance assessment: Needs assistance ?Sitting-balance support: Feet supported ?Sitting balance-Leahy Scale: Poor ?Sitting balance - Comments: min A throughout, some cues at times to lean forward and to R due to LOB posterior and L more due to attention/lethargy; sat EOB about 5 minutes ?  ?  ?  ?  ?  ?  ?  ?  ?  ?  ?  ?  ?  ?  ?  ?   ? ? ? ?  Pertinent Vitals/Pain Pain Assessment ?Facial Expression: Tense ?Body Movements: Restlessness ?Muscle Tension: Tense, rigid ?Compliance with ventilator (intubated pts.): Coughing but tolerating ?Vocalization (extubated pts.): N/A ?CPOT Total: 5  ? ? ?Home Living Family/patient expects to be discharged to:: Private residence ?Living Arrangements: Spouse/significant other ?Available Help at Discharge: Family ?  ?  ?  ?  ?  ?  ?  ?Additional Comments: pt intubated, family not available to give history.  ?  ?Prior Function Prior Level of Function : Patient poor  historian/Family not available ?  ?  ?  ?  ?  ?  ?  ?  ?  ? ? ?Hand Dominance  ?   ? ?  ?Extremity/Trunk Assessment  ? Upper Extremity Assessment ?Upper Extremity Assessment: Generalized weakness ?  ? ?Lower Extremity Assessment ?Lower Extremity Assessment: Generalized weakness ?  ? ?Cervical / Trunk Assessment ?Cervical / Trunk Assessment: Neck Surgery  ?Communication  ? Communication: Tracheostomy  ?Cognition Arousal/Alertness: Lethargic, Suspect due to medications ?Behavior During Therapy: Anxious ?Overall Cognitive Status: Difficult to assess ?  ?  ?  ?  ?  ?  ?  ?  ?  ?  ?  ?  ?  ?  ?  ?  ?  ?  ?  ? ?  ?General Comments General comments (skin integrity, edema, etc.): RN placing dressing on surgical incision posterior under trach ties ? ?  ?Exercises    ? ?Assessment/Plan  ?  ?PT Assessment Patient needs continued PT services  ?PT Problem List Decreased strength;Decreased mobility;Decreased safety awareness;Decreased activity tolerance;Decreased balance;Cardiopulmonary status limiting activity;Decreased knowledge of use of DME ? ?   ?  ?PT Treatment Interventions DME instruction;Therapeutic activities;Patient/family education;Therapeutic exercise;Gait training;Balance training;Functional mobility training   ? ?PT Goals (Current goals can be found in the Care Plan section)  ?  ? ?  ?Frequency Min 4X/week ?  ? ? ?Co-evaluation   ?  ?  ?  ?  ? ? ?  ?AM-PAC PT "6 Clicks" Mobility  ?Outcome Measure Help needed turning from your back to your side while in a flat bed without using bedrails?: A Lot ?Help needed moving from lying on your back to sitting on the side of a flat bed without using bedrails?: A Lot ?Help needed moving to and from a bed to a chair (including a wheelchair)?: Total ?Help needed standing up from a chair using your arms (e.g., wheelchair or bedside chair)?: Total ?Help needed to walk in hospital room?: Total ?Help needed climbing 3-5 steps with a railing? : Total ?6 Click Score: 8 ? ?  ?End of  Session Equipment Utilized During Treatment: Oxygen ?Activity Tolerance: Patient limited by fatigue ?Patient left: in bed ?  ?PT Visit Diagnosis: Other abnormalities of gait and mobility (R26.89);Muscle weakness (generalized) (M62.81);Other symptoms and signs involving the nervous system (R29.898) ?  ? ?Time: 6270-3500 ?PT Time Calculation (min) (ACUTE ONLY): 28 min ? ? ?Charges:   PT Evaluation ?$PT Eval High Complexity: 1 High ?PT Treatments ?$Therapeutic Activity: 8-22 mins ?  ?   ? ? ?Magda Kiel, PT ?Acute Rehabilitation Services ?XFGHW:[phone] ?[phone] ?12/17/2021 ? ? ?[name] ?12/17/2021, 10:18 AM ? ?

## 2021-12-17 NOTE — Progress Notes (Signed)
SBT held at this time with pt. FiO2 weaned down from 60%. Pt with increased agitation. Son at pt bedside. RT will continue to monitor and be available as needed.  ?

## 2021-12-17 NOTE — Procedures (Signed)
Cortrak ? ?Person Inserting Tube:  Esaw Dace, [place] ?Tube Type:  Cortrak - 43 inches ?Tube Size:  10 ?Tube Location:  Right nare ?Initial Placement:  Stomach ?Secured by: Dorann Lodge ?Technique Used to Measure Tube Placement:  Marking at nare/corner of mouth ?Cortrak Secured At:  73 cm ?Procedure Comments:  Cortrak Tube Team Note: ? ?Consult received to place a Cortrak feeding tube.  ? ?X-ray is required, abdominal x-ray has been ordered by the Cortrak team. Please confirm tube placement before using the Cortrak tube.  ? ?If the tube becomes dislodged please keep the tube and contact the Cortrak team at [email or web address] (password [email or web address]) for replacement.  ?If after hours and replacement cannot be delayed, place a NG tube and confirm placement with an abdominal x-ray.  ? ? ?Kerman Passey MS, RDN, LDN, CNSC ?Registered Dietitian III ?Clinical Nutrition ?RD Pager and On-Call Pager Number Located in [facility]  ? ? ? ? ?

## 2021-12-17 NOTE — Progress Notes (Signed)
Pharmacy Antibiotic Note ? ?Adam Moreno is a 66 y.o. male admitted on 12/06/2021 with pneumonia, initially started on Unasyn.  Pharmacy has been consulted to transition to cefepime/vancomycin dosing. SCr down to 1.17. ? ?Plan: ?Cefepime 2g IV q8h ?Vancomycin '2500mg'$  IV x 1; then 1g IV q12h. Goal AUC 400-550. ?Expected AUC: 415 ?SCr used: 1.17 ?Monitor clinical progress, c/s, renal function ?F/u de-escalation plan/LOT, vancomycin levels as indicated ? ? ?Height: 6' (182.9 cm) ?Weight: (!) 154.2 kg (339 lb 15.2 oz) ?IBW/kg (Calculated) : 77.6 ? ?Temp (24hrs), Avg:99.9 ?F (37.7 ?C), Min:98.9 ?F (37.2 ?C), Max:100.7 ?F (38.2 ?C) ? ?Recent Labs  ?Lab 12/16/2021 ?1459 12/10/2021 ?3383 12/12/21 ?0503 12/14/21 ?2919 12/15/21 ?1660 12/17/21 ?0636  ?WBC  --  10.8* 9.8 14.0*  --  7.9  ?CREATININE 1.54*  --  1.12 1.33* 1.36* 1.17  ?  ?Estimated Creatinine Clearance: 97.6 mL/min (by C-G formula based on SCr of 1.17 mg/dL).   ? ?No Known Allergies ? ? ?[name], PharmD, BCPS ?Please check AMION for all [facility] contact numbers ?Clinical Pharmacist ?12/17/2021 9:30 AM ? ?

## 2021-12-17 NOTE — Progress Notes (Signed)
SLP Cancellation Note ? ?Patient Details ?Name: Adam Moreno ?MRN: [id number] ?DOB: September 15, 1955 ? ? ?Cancelled treatment:       Reason Eval/Treat Not Completed: Patient not medically ready. Pt ventilated via trach, anxious. Will f/u for PMSV as appropriate.  ? ? ?Jaion Lagrange, Katherene Ponto ?12/17/2021, 12:28 PM ?

## 2021-12-17 NOTE — Progress Notes (Signed)
Peripherally Inserted Central Catheter Placement ? ?The IV Nurse has discussed with the patient and/or persons authorized to consent for the patient, the purpose of this procedure and the potential benefits and risks involved with this procedure.  The benefits include less needle sticks, lab draws from the catheter, and the patient may be discharged home with the catheter. Risks include, but not limited to, infection, bleeding, blood clot (thrombus formation), and puncture of an artery; nerve damage and irregular heartbeat and possibility to perform a PICC exchange if needed/ordered by physician.  Alternatives to this procedure were also discussed.  Bard Power PICC patient education guide, fact sheet on infection prevention and patient information card has been provided to patient /or left at bedside.   ? ?PICC Placement Documentation  ?PICC Triple Lumen 16/96/78 Right Basilic 51 cm 2 cm (Active)  ?Indication for Insertion or Continuance of Line Prolonged intravenous therapies 12/17/21 2130  ?Exposed Catheter (cm) 2 cm 12/17/21 2130  ?Site Assessment Clean, Dry, Intact 12/17/21 2130  ?Lumen #1 Status Flushed;Saline locked;Blood return noted 12/17/21 2130  ?Lumen #2 Status Flushed;Saline locked;Blood return noted 12/17/21 2130  ?Lumen #3 Status Flushed;Saline locked;Blood return noted 12/17/21 2130  ?Dressing Type Transparent;Securing device 12/17/21 2130  ?Dressing Status Antimicrobial disc in place;Clean, Dry, Intact 12/17/21 2130  ?Dressing Intervention New dressing 12/17/21 2130  ?Dressing Change Due 12/24/21 12/17/21 2130  ? ? ? ? ? ?Adam Moreno ?12/17/2021, 9:33 PM ? ?

## 2021-12-17 NOTE — Progress Notes (Signed)
eLink Physician-Brief Progress Note ?Patient Name: Ikaika Showers Danko ?DOB: 07-08-1956 ?MRN: [id number] ? ? ?Date of Service ? 12/17/2021  ?HPI/Events of Note ? Notified of agitation.  Pt is trached, on fentanyl gtt at 466mg/hr and precedex gtt.  RN reported that he woke up, was very agitated.  Pt given several fentanyl boluses through the night.   ?eICU Interventions ? Ok to give versed IV PRN.  ?Start on oral anxiolytics.   ? ? ? ?Intervention Category ?Intermediate Interventions: Other: ? ?VElsie Lincoln?12/17/2021, 4:39 AM ?

## 2021-12-17 NOTE — Discharge Summary (Signed)
Discharge Summary  Date of Admission: 12/05/2021  Date of Discharge: 29-Dec-2021  Attending Physician: Emelda Brothers, MD  Hospital Course: Patient originally presented for elective resection of a large intraspinal C1-2 nerve sheath tumor. During first attempt, he had some hypotension with induction that was concerning enough to abort the case, workup showed no cardiac events. He was intubated in the NICU and taken to surgery without incident. Post-operatively, he was extubated on POD1 but had to be reintubated due to severe respiratory distress. This again occurred on 5/13, both times with rapid development of severe hypoxemia, CCM recommended trach placement, which was performed on 5/14 and was uneventful. A post-op MRI showed Annas total resection of the cervical schwannoma with resolved mass effect on the spinal cord and improved T2 signal change in the cord. He had concern for aspiration PNA and antibiotics were started. He unfortunately continued to have a very complex course post-operatively with delirium and respiratory failure. His AKI progressed to oliguria and he had continued progression of multi-organ failure. On 2022-12-30 he had worsening hypotension that was refractory to multiple pressors with elevated troponin, was too unstable for CT PE study, bedside echo showed severely dilated RV c/f PE. He became bradycardic and hypotensive, pt had been made DNR earlier in the day, he did not respond to resuscitation efforts and died at 17:00 on 2021-12-29. Presumed cause of death was pulmonary embolism.  Discharge diagnoses: Cervical schwannoma, acute kidney injury, acute respiratory failure, aspiration pneumonia, pulmonary embolism  Judith Part, MD 12/17/21 9:46 AM

## 2021-12-17 NOTE — Progress Notes (Signed)
Neurosurgery Service ?Progress Note ? ?Subjective: No acute events overnight, trach'd yesterday without event, still having some anxiety ? ?Objective: ?Vitals:  ? 12/17/21 0752 12/17/21 0800 12/17/21 0815 12/17/21 0900  ?BP:  109/71 126/63 130/63  ?Pulse: 68 65 (!) 58 61  ?Resp: (!) 27 (!) 22 (!) 21 20  ?Temp:  98.9 ?F (37.2 ?C)    ?TempSrc:  Axillary    ?SpO2: 95% 97% 95% 96%  ?Weight:      ?Height:      ? ? ?Physical Exam: ?Intubated, sedated, but reliably follows commands x4 with full strength, incision c/d/i ? ?Assessment & Plan: ?66 y.o. 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 5/11 s/p uneventful C1/C2 laminectomies and resection of tumor, prelim path schwannoma, 5/12 Abx for PNA 5/12 & 5/13 extubated and reintubated x2, 5/14 s/p trach, MRI w/ GTR, improved cord mass effect & dec'd signal change ? ?-CCM recs ?-neurosurgically stable ? ?Adam Moreno  ?12/17/21 ?9:38 AM ? ?

## 2021-12-18 DIAGNOSIS — J95851 Ventilator associated pneumonia: Secondary | ICD-10-CM | POA: Diagnosis not present

## 2021-12-18 DIAGNOSIS — J9601 Acute respiratory failure with hypoxia: Secondary | ICD-10-CM | POA: Diagnosis not present

## 2021-12-18 DIAGNOSIS — Z930 Tracheostomy status: Secondary | ICD-10-CM | POA: Diagnosis not present

## 2021-12-18 DIAGNOSIS — D334 Benign neoplasm of spinal cord: Secondary | ICD-10-CM | POA: Diagnosis not present

## 2021-12-18 LAB — BASIC METABOLIC PANEL
Anion gap: 6 (ref 5–15)
BUN: 19 mg/dL (ref 8–23)
CO2: 26 mmol/L (ref 22–32)
Calcium: 7.5 mg/dL — ABNORMAL LOW (ref 8.9–10.3)
Chloride: 111 mmol/L (ref 98–111)
Creatinine, Ser: 1.21 mg/dL (ref 0.61–1.24)
GFR, Estimated: >60 mL/min (ref >60)
Glucose, Bld: 155 mg/dL — ABNORMAL HIGH (ref 70–99)
Potassium: 3.1 mmol/L — ABNORMAL LOW (ref 3.5–5.1)
Sodium: 143 mmol/L (ref 135–145)

## 2021-12-18 LAB — GLUCOSE, CAPILLARY
Glucose-Capillary: 148 mg/dL — ABNORMAL HIGH (ref 70–99)
Glucose-Capillary: 152 mg/dL — ABNORMAL HIGH (ref 70–99)
Glucose-Capillary: 169 mg/dL — ABNORMAL HIGH (ref 70–99)
Glucose-Capillary: 146 mg/dL — ABNORMAL HIGH (ref 70–99)
Glucose-Capillary: 163 mg/dL — ABNORMAL HIGH (ref 70–99)
Glucose-Capillary: 157 mg/dL — ABNORMAL HIGH (ref 70–99)

## 2021-12-18 LAB — SURGICAL PATHOLOGY

## 2021-12-18 LAB — PHOSPHORUS: Phosphorus: 3.7 mg/dL (ref 2.5–4.6)

## 2021-12-18 LAB — MAGNESIUM: Magnesium: 2 mg/dL (ref 1.7–2.4)

## 2021-12-18 LAB — TRIGLYCERIDES: Triglycerides: 125 mg/dL (ref <150)

## 2021-12-18 MED ORDER — ACETAMINOPHEN 325 MG PO TABS
650.0000 mg | ORAL_TABLET | ORAL | Status: DC | PRN
  Administered 2021-12-18 – 2021-12-19 (×2): 650 mg
  Filled 2021-12-18 (×2): qty 2

## 2021-12-18 MED ORDER — QUETIAPINE FUMARATE 100 MG PO TABS
100.0000 mg | ORAL_TABLET | Freq: Two times a day (BID) | ORAL | Status: DC
  Administered 2021-12-18 (×2): 100 mg
  Filled 2021-12-18 (×2): qty 1

## 2021-12-18 MED ORDER — SENNA 8.6 MG PO TABS
1.0000 | ORAL_TABLET | Freq: Every day | ORAL | Status: DC
  Administered 2021-12-18 – 2021-12-19 (×2): 8.6 mg
  Filled 2021-12-18 (×2): qty 1

## 2021-12-18 MED ORDER — ACETAMINOPHEN 650 MG RE SUPP
650.0000 mg | RECTAL | Status: DC | PRN
  Administered 2021-12-19 – 2021-12-20 (×2): 650 mg via RECTAL
  Filled 2021-12-18 (×2): qty 1

## 2021-12-18 MED ORDER — DEXMEDETOMIDINE HCL IN NACL 400 MCG/100ML IV SOLN
0.0000 ug/kg/h | INTRAVENOUS | Status: DC
  Administered 2021-12-18 (×2): 1.2 ug/kg/h via INTRAVENOUS
  Administered 2021-12-18: 1 ug/kg/h via INTRAVENOUS
  Administered 2021-12-18 – 2021-12-19 (×4): 1.2 ug/kg/h via INTRAVENOUS
  Administered 2021-12-19: 1 ug/kg/h via INTRAVENOUS
  Administered 2021-12-19: 1.2 ug/kg/h via INTRAVENOUS
  Administered 2021-12-19: 1 ug/kg/h via INTRAVENOUS
  Administered 2021-12-19: 1.2 ug/kg/h via INTRAVENOUS
  Administered 2021-12-19: 1 ug/kg/h via INTRAVENOUS
  Filled 2021-12-18: qty 200
  Filled 2021-12-18 (×3): qty 100
  Filled 2021-12-18: qty 200
  Filled 2021-12-18: qty 300
  Filled 2021-12-18 (×3): qty 100

## 2021-12-18 MED ORDER — ENOXAPARIN SODIUM 40 MG/0.4ML IJ SOSY
40.0000 mg | PREFILLED_SYRINGE | Freq: Every day | INTRAMUSCULAR | Status: DC
Start: 2021-12-18 — End: 2021-12-19
  Administered 2021-12-18: 40 mg via SUBCUTANEOUS
  Filled 2021-12-18: qty 0.4

## 2021-12-18 MED ORDER — FUROSEMIDE 10 MG/ML IJ SOLN
60.0000 mg | Freq: Two times a day (BID) | INTRAMUSCULAR | Status: DC
  Administered 2021-12-18 (×2): 60 mg via INTRAVENOUS
  Filled 2021-12-18 (×2): qty 6

## 2021-12-18 MED ORDER — POTASSIUM CHLORIDE 20 MEQ PO PACK
40.0000 meq | PACK | ORAL | Status: AC
  Administered 2021-12-18 (×3): 40 meq
  Filled 2021-12-18 (×3): qty 2

## 2021-12-18 NOTE — Progress Notes (Signed)
Physical Therapy Treatment ?Patient Details ?Name: Adam Moreno ?MRN: [id number] ?DOB: March 31, 1956 ?Today's Date: 12/18/2021 ? ? ?History of Present Illness 66 y.o. 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 5/11 s/p uneventful C1/C2 laminectomies and resection of tumor, prelim path schwannoma, 5/12 Abx for PNA 5/12 & 5/13 extubated and reintubated x2, 5/14 s/p trach, MRI w/ GTR, improved cord mass effect & dec'd signal change. ? ?  ?PT Comments  ? ? Patient lethargic and unable to maintain arousal this session so limited to in bed and attempting to pull forward from chair position with +2 total A.  Patient noted more edematous in extremities so OT performed PROM and elevation on pillows.  Concern for slow progression so d/c recommendations updated today to acute inpatient rehab.  PT will continue to follow acutely.    ?Recommendations for follow up therapy are one component of a multi-disciplinary discharge planning process, led by the attending physician.  Recommendations may be updated based on patient status, additional functional criteria and insurance authorization. ? ?Follow Up Recommendations ? Acute inpatient rehab (3hours/day) ?  ?  ?Assistance Recommended at Discharge Frequent or constant Supervision/Assistance  ?Patient can return home with the following A lot of help with bathing/dressing/bathroom;Assistance with cooking/housework;Assist for transportation;Help with stairs or ramp for entrance;A lot of help with walking and/or transfers ?  ?Equipment Recommendations ? Other (comment) (TBA once OOB mobility and when home equipment confirmed)  ?  ?Recommendations for Other Services   ? ? ?  ?Precautions / Restrictions Precautions ?Precautions: Fall;Cervical ?Precaution Comments: trach ?Restrictions ?Weight Bearing Restrictions: No  ?  ? ?Mobility ? Bed  Mobility ?Overal bed mobility: Needs Assistance ?Bed Mobility: Supine to Sit ?  ?  ?  ?  ?  ?General bed mobility comments: patient elevated to chair position then attempt to pull forward into upright sitting with +2 total A but pt with only minimal participation several attempts ?  ? ?Transfers ?  ?  ?  ?  ?  ?  ?  ?  ?  ?  ?  ? ?Ambulation/Gait ?  ?  ?  ?  ?  ?  ?  ?  ? ? ?Stairs ?  ?  ?  ?  ?  ? ? ?Wheelchair Mobility ?  ? ?Modified Rankin (Stroke Patients Only) ?  ? ? ?  ?Balance Overall balance assessment: Needs assistance ?  ?Sitting balance-Leahy Scale: Zero ?Sitting balance - Comments: no attempts to help with sitting forward in bed ?  ?  ?  ?  ?  ?  ?  ?  ?  ?  ?  ?  ?  ?  ?  ?  ? ?  ?Cognition Arousal/Alertness: Lethargic ?Behavior During Therapy: Flat affect ?Overall Cognitive Status: Difficult to assess ?  ?  ?  ?  ?  ?  ?  ?  ?  ?  ?  ?  ?  ?  ?  ?  ?  ?  ?  ? ?  ?Exercises Other Exercises ?Other Exercises: OT performing UE PROM in supine and elevating arms on pillows due to edema ? ?  ?General Comments General comments (skin integrity, edema, etc.): VSS on PRVC mode on vent via trach 40% FiO2 PEEP 8; Deep sternal rub, nail bed pressure and upper trap squeeze with brief and minimal eye opening,  RN  lowered Precedex and stopped Dilaudid drip, but reports had some sedation for bath earlier. ?  ?  ? ?Pertinent Vitals/Pain Pain Assessment ?Pain Assessment: Faces ?Faces Pain Scale: Hurts a little bit ?Pain Location: LUE with PROM ?Pain Descriptors / Indicators: Discomfort, Guarding, Grimacing ?Pain Intervention(s): Monitored during session  ? ? ?Home Living Family/patient expects to be discharged to:: Private residence ?Living Arrangements: Spouse/significant other ?Available Help at Discharge: Family ?  ?  ?  ?  ?  ?  ?  ?Additional Comments: pt intubated, family not available to give history.  ?  ?Prior Function    ?  ?  ?   ? ?PT Goals (current goals can now be found in the care plan section) Acute Rehab  PT Goals ?Patient Stated Goal: Unable to state ?PT Goal Formulation: Patient unable to participate in goal setting ?Time For Goal Achievement: 12/31/21 ?Potential to Achieve Goals: Good ?Progress towards PT goals: Not progressing toward goals - comment (due to lethargy) ? ?  ?Frequency ? ? ? Min 4X/week ? ? ? ?  ?PT Plan Discharge plan needs to be updated  ? ? ?Co-evaluation PT/OT/SLP Co-Evaluation/Treatment: Yes ?Reason for Co-Treatment: Complexity of the patient's impairments (multi-system involvement);Necessary to address cognition/behavior during functional activity;For patient/therapist safety ?PT goals addressed during session: Mobility/safety with mobility ?OT goals addressed during session: ADL's and self-care ?  ? ?  ?AM-PAC PT "6 Clicks" Mobility   ?Outcome Measure ? Help needed turning from your back to your side while in a flat bed without using bedrails?: Total ?Help needed moving from lying on your back to sitting on the side of a flat bed without using bedrails?: Total ?Help needed moving to and from a bed to a chair (including a wheelchair)?: Total ?Help needed standing up from a chair using your arms (e.g., wheelchair or bedside chair)?: Total ?Help needed to walk in hospital room?: Total ?Help needed climbing 3-5 steps with a railing? : Total ?6 Click Score: 6 ? ?  ?End of Session Equipment Utilized During Treatment: Oxygen ?Activity Tolerance: Patient limited by lethargy ?Patient left: in bed;with call bell/phone within reach ?  ?PT Visit Diagnosis: Other abnormalities of gait and mobility (R26.89);Muscle weakness (generalized) (M62.81);Other symptoms and signs involving the nervous system (R29.898) ?  ? ? ?Time: 4656-8127 ?PT Time Calculation (min) (ACUTE ONLY): 19 min ? ?Charges:  $Therapeutic Activity: 8-22 mins          ?          ? ?Magda Kiel, PT ?Acute Rehabilitation Services ?NTZGY:[phone] ?[phone] ?12/18/2021 ? ? ? ?[name] ?12/18/2021, 6:04 PM ? ?

## 2021-12-18 NOTE — Progress Notes (Signed)
Patient became agitated when I entered the room.  He removed his Cortrack and vent from his trach.  Began thrashing around in bed and trying to kick and grab staff.  Multiple staff held down patient and administered '2mg'$  Versed and placed patient in 4 point restraints.  Patient now resting comfortably in bed and VSS.  Will continue to monitor and assess need for restraints.   ?

## 2021-12-18 NOTE — Progress Notes (Signed)
eLink Physician-Brief Progress Note ?Patient Name: Adam Moreno ?DOB: 1956/07/16 ?MRN: [id number] ? ? ?Date of Service ? 12/18/2021  ?HPI/Events of Note ? Notified that patient abruptly woke up and was aggressive and swinging at staff, pulled out his feeding tube and RN had given a dose of versed and placed restraints on. Now asking for an order.   ?eICU Interventions ? Restraints ordered and should be used for shortest time needed. Currently looks comfortable on camera but restraints ordered to prevent dislodgement of devices  ? ? ? ?Intervention Category ?Major Interventions: Delirium, psychosis, severe agitation - evaluation and management ? ?Thermon Zulauf G Zyria Fiscus ?12/18/2021, 10:32 PM ?

## 2021-12-18 NOTE — Evaluation (Signed)
Occupational Therapy Evaluation ?Patient Details ?Name: Adam Moreno ?MRN: [id number] ?DOB: [birth year] ?Today's Date: 12/18/2021 ? ? ?History of Present Illness 66 y.o. 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 5/11 s/p uneventful C1/C2 laminectomies and resection of tumor, prelim path schwannoma, 5/12 Abx for PNA 5/12 & 5/13 extubated and reintubated x2, 5/14 s/p trach, MRI w/ GTR, improved cord mass effect & dec'd signal change.  ? ?Clinical Impression ?  ?Adam Moreno was evaluated s/p the above admission list, he is indep at baseline and lives with his spouse. Home set up and LOA need to be confirmed due to pts LOA and expressive difficulties. Upon arrival, pt was lethargic and difficult to rouse. He required total A +2 for minimal movements at bed level including long sitting attempts. He was also total A to hand over hand for grooming with no initiation or sustaining tasks. Due to LOA and limited participation this date he requires total A +2 for all aspects of self care. Recommend d/c to AIR for maximal functional recovery   ? ?Recommendations for follow up therapy are one component of a multi-disciplinary discharge planning process, led by the attending physician.  Recommendations may be updated based on patient status, additional functional criteria and insurance authorization.  ? ?Follow Up Recommendations ? Acute inpatient rehab (3hours/day)  ?  ?Assistance Recommended at Discharge Frequent or constant Supervision/Assistance  ?Patient can return home with the following A lot of help with walking and/or transfers;A lot of help with bathing/dressing/bathroom;Assistance with cooking/housework;Direct supervision/assist for financial management;Assist for transportation;Help with stairs or ramp for entrance ? ?  ?Functional Status Assessment ? Patient has had a recent  decline in their functional status and demonstrates the ability to make significant improvements in function in a reasonable and predictable amount of time.  ?Equipment Recommendations ? Other (comment) (pending progress)  ?  ?Recommendations for Other Services Rehab consult ? ? ?  ?Precautions / Restrictions Precautions ?Precautions: Fall ?Precaution Comments: trach ?Restrictions ?Weight Bearing Restrictions: No  ? ?  ? ?Mobility Bed Mobility ?Overal bed mobility: Needs Assistance ?  ?  ?  ?  ?  ?  ?General bed mobility comments: attempted to pull into long sitting to increase attention and arousal - total A +2 ?  ? ?Transfers ?  ?  ?  ?  ?  ?  ?  ?  ?  ?General transfer comment: NT, too sleepy ?  ? ?  ?Balance Overall balance assessment: Needs assistance ?Sitting-balance support: Feet supported ?Sitting balance-Leahy Scale: Zero ?  ?  ?  ?  ?  ?  ?  ?  ?  ?  ?  ?  ?  ?  ?  ?  ?   ? ?ADL either performed or assessed with clinical judgement  ? ?ADL Overall ADL's : Needs assistance/impaired ?  ?  ?  ?  ?  ?  ?  ?  ?  ?  ?  ?  ?  ?  ?  ?  ?  ?  ?Functional mobility during ADLs: Total assistance;+2 for physical assistance;+2 for safety/equipment ?General ADL Comments: total A +2 for all aspects of care at bed level - lethargic, not following commands  ? ? ? ?Vision   ?   ?   ?Perception   ?  ?Praxis   ?  ? ?Pertinent Vitals/Pain Pain Assessment ?Pain  Assessment: Faces ?Faces Pain Scale: Hurts a little bit ?Pain Location: LUE with PROM ?Pain Descriptors / Indicators: Discomfort, Guarding, Grimacing ?Pain Intervention(s): Limited activity within patient's tolerance, Monitored during session  ? ? ? ?Hand Dominance   ?  ?Extremity/Trunk Assessment Upper Extremity Assessment ?Upper Extremity Assessment: RUE deficits/detail;LUE deficits/detail ?RUE Deficits / Details: swollen, trace activation noted when attempting to pull into log sitting in the bed. otherwise flaccid ?RUE Coordination: decreased Esses motor;decreased fine  motor ?LUE Deficits / Details: swollen, trace activation noted when attempting to pull into log sitting in the bed. otherwise flaccid ?LUE Coordination: decreased Dambrosia motor;decreased fine motor ?  ?  ?  ?Cervical / Trunk Assessment ?Cervical / Trunk Assessment: Other exceptions;Neck Surgery (increased body habitus) ?  ?Communication Communication ?Communication: Tracheostomy ?  ?Cognition Arousal/Alertness: Lethargic ?Behavior During Therapy: Flat affect ?Overall Cognitive Status: Difficult to assess ?  ?  ?  ?  ?  ?  ?  ?  ?  ?  ?  ?  ?  ?  ?  ?  ?General Comments: lethargic with eyes closed. eyes would briefly open to sternal rub and loud voice. followed 1-2 commands, otherwise lethargic ?  ?  ?General Comments  VSS, RN present ? ?  ?Exercises   ?  ?Shoulder Instructions    ? ? ?Home Living Family/patient expects to be discharged to:: Private residence ?Living Arrangements: Spouse/significant other ?Available Help at Discharge: Family ?  ?  ?  ?  ?  ?  ?  ?  ?  ?  ?  ?  ?  ?  ?Additional Comments: pt intubated, family not available to give history. ?  ? ?  ?Prior Functioning/Environment Prior Level of Function : Patient poor historian/Family not available ?  ?  ?  ?  ?  ?  ?  ?  ?  ? ?  ?  ?OT Problem List: Decreased range of motion;Decreased strength;Decreased activity tolerance;Impaired balance (sitting and/or standing);Decreased safety awareness;Decreased knowledge of use of DME or AE;Decreased knowledge of precautions ?  ?   ?OT Treatment/Interventions: Self-care/ADL training;Therapeutic exercise;Neuromuscular education;DME and/or AE instruction;Therapeutic activities;Patient/family education;Balance training  ?  ?OT Goals(Current goals can be found in the care plan section) Acute Rehab OT Goals ?Patient Stated Goal: unable to state ?OT Goal Formulation: Patient unable to participate in goal setting ?Time For Goal Achievement: 01/01/22 ?Potential to Achieve Goals: Good ?ADL Goals ?Pt Will Perform Grooming:  with min assist;sitting ?Pt Will Perform Upper Body Dressing: with min assist;sitting ?Pt Will Perform Lower Body Dressing: with mod assist;sit to/from stand ?Pt Will Transfer to Toilet: with mod assist;ambulating  ?OT Frequency: Min 2X/week ?  ? ?Co-evaluation PT/OT/SLP Co-Evaluation/Treatment: Yes ?Reason for Co-Treatment: Complexity of the patient's impairments (multi-system involvement);For patient/therapist safety;To address functional/ADL transfers ?  ?OT goals addressed during session: ADL's and self-care ?  ? ?  ?AM-PAC OT "6 Clicks" Daily Activity     ?Outcome Measure Help from another person eating meals?: Total ?Help from another person taking care of personal grooming?: Total ?Help from another person toileting, which includes using toliet, bedpan, or urinal?: Total ?Help from another person bathing (including washing, rinsing, drying)?: Total ?Help from another person to put on and taking off regular upper body clothing?: Total ?Help from another person to put on and taking off regular lower body clothing?: Total ?6 Click Score: 6 ?  ?End of Session Nurse Communication: Mobility status ? ?Activity Tolerance: Patient limited by lethargy ?Patient left: in bed;with call bell/phone within  reach;with bed alarm set ? ?OT Visit Diagnosis: Unsteadiness on feet (R26.81);Other abnormalities of gait and mobility (R26.89);Muscle weakness (generalized) (M62.81)  ?              ?Time: 8413-2440 ?OT Time Calculation (min): 19 min ?Charges:  OT General Charges ?$OT Visit: 1 Visit ?OT Evaluation ?$OT Eval Moderate Complexity: 1 Mod ? ? ?Anjana Cheek A Kaelei Wheeler ?12/18/2021, 4:25 PM ?

## 2021-12-18 NOTE — Progress Notes (Signed)
Nutrition Follow-up ? ?DOCUMENTATION CODES:  ? ?Not applicable ? ?INTERVENTION:  ? ?Tube feeds via cortrak tube: ?- Vital 1.5 @ 50 ml/hr (1200 ml/day) ?- ProSource TF 45 ml TID ?  ?Tube feeding regimen provides 1920 kcal, 114 grams of protein, and 917 ml of H2O.  ?  ? ? ?NUTRITION DIAGNOSIS:  ? ?Inadequate oral intake related to inability to eat as evidenced by NPO status. ?Ongoing.  ? ?GOAL:  ? ?Patient will meet greater than or equal to 90% of their needs ?Progressing.  ? ?MONITOR:  ? ?Vent status, Labs, Weight trends, TF tolerance, I & O's ? ?REASON FOR ASSESSMENT:  ? ?Consult ?Enteral/tube feeding initiation and management ? ?ASSESSMENT:  ? ?66 year old male who presented on 5/09 for planned laminectomy/surgical resection of C1-C2 nerve sheath tumor. PMH of intermittent dysarthria, dysphagia, large mass consistent with a nerve sheath tumor, OSA noncompliant with CPAP, CKD stage IIIa, HTN, depression. ? ?Pt discussed during ICU rounds and with RN and MD. Pt on vent support via trach. Working towards weaning.  ?Spoke with pt's wife at bedside. She provides hx that pt had lost 50 lb in the last 3 weeks PTA due to inability to swallow. Over last week PTA pt was able to take tablespoons of water but that was it.  ? ? ?Admission weight: 147.4 ?Current weight: 154.2 kg ?Pt is + 10 L this admission  ? ?05/09 - intubated for surgery, became hypotensive, required CPR, extubated in PACU, case aborted, regular diet order ?05/11 - NPO, intubated, s/p C1 and C2 laminectomy and resection of intraspinal tumor ?05/12 - extubated, emergently re-intubated shortly after ?05/14 - s/p trach  ?05/15 - s/p cortrak placement; per xray tip in distal stomach  ? ?Medications reviewed and include: lasix, protonix, miralax, KCl x 4, senokot ?Precedex ?Dilaudid ? ?Labs reviewed: K 3.1, PO4/Magnesium WNL  ?CBG's: 106-152 ?  ?UOP: 2675 ml  ?I&O: +10 L ? ?Diet Order:   ?Diet Order   ? ?       ?  Diet NPO time specified  Diet effective now        ?  ? ?  ?  ? ?  ? ? ?EDUCATION NEEDS:  ? ?No education needs have been identified at this time ? ?Skin:  Skin Assessment: Skin Integrity Issues: ?Skin Integrity Issues:: Incisions ?Incisions: neck ? ?Last BM:  5/12 ? ?Height:  ? ?Ht Readings from Last 1 Encounters:  ?12/31/2021 6' (1.829 m)  ? ? ?Weight:  ? ?Wt Readings from Last 1 Encounters:  ?12/15/21 (!) 154.2 kg  ? ? ?Ideal Body Weight:  80.9 kg ? ?BMI:  Body mass index is 46.11 kg/m?. ? ?Estimated Nutritional Needs:  ? ?Kcal:  1900-2100 ? ?Protein:  110-130 grams ? ?Fluid:  >/= 2.0 L ? ?Lockie Pares., RD, LDN, CNSC ?See AMiON for contact information  ? ?

## 2021-12-18 NOTE — Progress Notes (Signed)
? ?NAME:  Adam Moreno, MRN:  [id number], DOB:  04-11-1956, LOS: 7 ?ADMISSION DATE:  12/18/2021 CONSULTATION DATE:  12/10/2021 ?REFERRING MD:  Zada Finders - NSGY CHIEF COMPLAINT:  Hypotension  ? ?History of Present Illness:  ?65 year old man who presented to [facility] 5/9 for planned laminectomy/surgical resection of C1-C2 nerve sheath tumor with NSGY (Dr. Zada Finders). 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 ? ?Patient presented for scheduled laminectomy/nerve sheath tumor resection 5/9. Post-induction/intubation prior to the procedure, patient became hypotensive and it was unclear if he had a pulse (true pulselessness versus instrumentation). Epi was administered x 1 and a short course of CPR was completed with return of palpable pulse. Case was aborted. Patient was successfully extubated in the OR with appropriate mental status. Placed on Neosynephrine for BP support. Patient was transferred to ICU for further workup. ?PCCM was consulted for help with evaluation and medical management ? ?Pertinent Medical History:  ? ?Past Medical History:  ?Diagnosis Date  ? Depression   ? Hypertension   ? Sleep apnea   ? doesn't wear CPAP  ? ?Significant Hospital Events: ?Including procedures, antibiotic start and stop dates in addition to other pertinent events   ?5/9 - Presented to [facility] for planned C1-C2 nerve sheath tumor resection/laminectomy. Hypotensive shortly after induction/intubation requiring case to be aborted. Extubated in OR. Transferred to ICU for further cardiac workup. ?5/12 extubated, Afib, cardiac arrest, emergently reintubated ?5/14 extubated, desaturated to 30s%, emergently reintubated> trached ?5/15 started on empiric antibiotics, PICC placed, CVC & foley out ? ?Interim History / Subjective:  ?Agitated this morning, but better with seroquel and clonazepam. ? ?Objective:  ?Blood pressure (!) 121/59, pulse (!) 58, temperature  99.1 ?F (37.3 ?C), temperature source Axillary, resp. rate 18, height 6' (1.829 m), weight (!) 154.2 kg, SpO2 98 %. ?   ?Vent Mode: PRVC ?FiO2 (%):  [40 %-50 %] 40 % ?Set Rate:  [18 bmp] 18 bmp ?Vt Set:  [314 mL] 630 mL ?PEEP:  [8 cmH20] 8 cmH20 ?Plateau Pressure:  [17 cmH20-20 cmH20] 18 cmH20  ? ?Intake/Output Summary (Last 24 hours) at 12/18/2021 0738 ?Last data filed at 12/18/2021 0600 ?[place] per 24 hour  ?Intake 3642.69 ml  ?Output 2675 ml  ?Net 967.69 ml  ? ? ?Filed Weights  ? 01/01/2022 0950 12/14/21 0500 12/15/21 0500  ?Weight: (!) 147.4 kg (!) 149.6 kg (!) 154.2 kg  ? ?Physical Examination: ?General: critically ill appearing man lying in bed, agitated. ?HEENT: [place]/AT, eyes anicteric ?Neck: trach in place, no bleeding or erythema ?Neuro: RASS +2, shifted sideways in bed, not verbally redirectable. Falls back asleep when not stimulated. Recognizes his wife. Does not answer y/n questions accurately. Following some commands. Moving all extremities spontaneously. ?Chest: rhonchi and wheezing improved, synchronous with MV, tolerating PS 10 ?Heart: S1S2. RRR ?Abdomen: obese, soft, NT ?Skin: warm, dry, no rashes. ? ?K+ 3.1 ?BUN ?Cr 1.21 ?H/H 11.2/33.9 ?Resp culture> mod WBC>  ? ? ?Resolved Hospital Problem List:  ?Medication related Hypotension anesthesia  ?Hypokalemia/hypomagnesemia  ? ?Assessment & Plan:  ? ?Acute hypoxic respiratory failure; failed extubation x 2. With degree of desaturation he likely had undiagnosed pulmonary issues contributing to his episodes, possibly triggering some of his anxiety. Trached on 5/14. ?Probable aspiration pneumonia ?-LTVV ?-con't vent weaning efforts ?-PAD protocol ?-VAP prevention protocol ?-routine trach care; can remove trach sutures on 5/21 ?-Con't empiric cefepime and follow resp culture. Stop vanc with negative MRSA nare. ? ?Acute metabolic encephalopathy due  to hypoxia ?Anxiety ?-PAD protocol- dilaudid and precedex infusion ?-increase seroquel to '100mg'$  BID-- Qtc ok ?-con't  clonazepam ? ?New diagnosis of atrial fibrillation with RVR, converted to sinus rhythm ?Acute on chronic HFpEF ?-monitor off amiodarone ?-no need for [facility] unless recurrent Afib ?-tele monitoring ?-lasix for goal euvolemia ? ?Hypokalemia ?-repleted ? ?Morbid obesity ?OSA, noncompliant with CPAP ?-cortrak, con't TF per RD recs ? ?Schwannoma of C1-C2 nerve sheath, s/p C1-C2 laminectomy ?-Con't working with PT, OT, SLP ?-con't rehab efforts; delirium is one barrier ? ?AKI, improved ?-strict I/O ?-renally dose meds, avoid nephrotoxic meds ? ?Hypokalemia ?-repleted ? ?Anemia due to critical illness ?-transfuse for Hb <7  ?-monitor periodically ? ?Hypertension ?-holding home meds for now while still needing IV sedation that drops BP> PTA meds amlodipine, lasix, metoprolol succ, valsartan-HCTZ ? ? ?Best Practice: (right click and "Reselect all SmartList Selections" daily)  ?Diet: TF ?GI: PPI ?DVT: enoxaparin ?Family updates:  wife updated at bedside 5/16 ? ?Labs:  ?CBC: ?Recent Labs  ?Lab 12/17/2021 ?1356 12/03/2021 ?1604 12/12/21 ?0503 12/14/21 ?5449 12/17/21 ?0636  ?WBC  --  10.8* 9.8 14.0* 7.9  ?NEUTROABS  --   --   --   --  5.7  ?HGB 14.6 14.6 14.4 13.4 11.2*  ?HCT 43.0 42.9 43.0 39.2 33.9*  ?MCV  --  86.3 86.2 86.3 88.5  ?PLT  --  291 256 250 183  ? ? ?Basic Metabolic Panel: ?Recent Labs  ?Lab 12/31/2021 ?1459 12/08/2021 ?1830 12/12/21 ?2010 12/14/21 ?0712 12/14/21 ?1712 12/15/21 ?1975 12/15/21 ?1127 12/17/21 ?8832 12/18/21 ?5498  ?NA 138  --  140 138  --  141  --  139  --   ?K 3.1*  --  3.3* 3.8  --  3.7  --  3.0*  --   ?CL 102  --  103 104  --  105  --  105  --   ?CO2 28  --  28 27  --  29  --  28  --   ?GLUCOSE 154*  --  111* 144*  --  159*  --  123*  --   ?BUN 31*  --  22 20  --  23  --  16  --   ?CREATININE 1.54*  --  1.12 1.33*  --  1.36*  --  1.17  --   ?CALCIUM 8.7*  --  8.8* 8.7*  --  7.9*  --  7.9*  --   ?MG  --    < > 2.0 1.7 2.3 2.0 1.9 1.8 2.0  ?PHOS  --    < > 2.9  --  2.5 3.4 4.1 2.8 3.7  ? < > = values in this  interval not displayed.  ? ? ?GFR: ?Estimated Creatinine Clearance: 97.6 mL/min (by C-G formula based on SCr of 1.17 mg/dL). ?Recent Labs  ?Lab 12/06/2021 ?1604 12/12/21 ?0503 12/14/21 ?2641 12/17/21 ?0636  ?WBC 10.8* 9.8 14.0* 7.9  ? ? ?Liver Function Tests: ?Recent Labs  ?Lab 12/10/2021 ?1459 12/12/21 ?0503  ?AST 27 23  ?ALT 30 23  ?ALKPHOS 63 53  ?BILITOT 1.0 1.0  ?PROT 6.5 6.6  ?ALBUMIN 3.3* 3.4*  ? ? ?This patient is critically ill with multiple organ system failure which requires frequent high complexity decision making, assessment, support, evaluation, and titration of therapies. This was completed through the application of advanced monitoring technologies and extensive interpretation of multiple databases. During this encounter critical care time was devoted to patient care services described in this note for 38  minutes. ? ? ?Julian Hy, DO 12/18/21 7:38 AM ?[facility] ? ?

## 2021-12-18 NOTE — Progress Notes (Signed)
Neurosurgery Service ?Progress Note ? ?Subjective: No acute events overnight ? ?Objective: ?Vitals:  ? 12/18/21 0400 12/18/21 0500 12/18/21 0600 12/18/21 0700  ?BP: 108/65 96/70 103/61 (!) 121/59  ?Pulse: 62 61 (!) 57 (!) 58  ?Resp: (!) '23 19 18 18  '$ ?Temp: 99.1 ?F (37.3 ?C)     ?TempSrc: Axillary     ?SpO2: 95% 96% 96% 98%  ?Weight:      ?Height:      ? ? ?Physical Exam: ?Intubated, sedated, but reliably follows commands x4 with full strength, incision c/d/i ? ?Assessment & Plan: ?66 y.o. 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 5/11 s/p uneventful C1/C2 laminectomies and resection of tumor, prelim path schwannoma, 5/12 Abx for PNA 5/12 & 5/13 extubated and reintubated x2, 5/14 s/p trach, MRI w/ GTR, improved cord mass effect & dec'd signal change ? ?-CCM recs ?-continues to be neurosurgically stable ? ?Marcello Moores A Cristianna Cyr  ?12/18/21 ?7:51 AM ? ?

## 2021-12-19 ENCOUNTER — Inpatient Hospital Stay (HOSPITAL_COMMUNITY): Payer: BC Managed Care – PPO

## 2021-12-19 ENCOUNTER — Encounter (HOSPITAL_COMMUNITY): Payer: Self-pay | Admitting: Neurological Surgery

## 2021-12-19 DIAGNOSIS — Z9911 Dependence on respirator [ventilator] status: Secondary | ICD-10-CM | POA: Diagnosis not present

## 2021-12-19 DIAGNOSIS — I10 Essential (primary) hypertension: Secondary | ICD-10-CM

## 2021-12-19 DIAGNOSIS — J9811 Atelectasis: Secondary | ICD-10-CM

## 2021-12-19 DIAGNOSIS — D649 Anemia, unspecified: Secondary | ICD-10-CM

## 2021-12-19 DIAGNOSIS — J690 Pneumonitis due to inhalation of food and vomit: Secondary | ICD-10-CM

## 2021-12-19 DIAGNOSIS — Z930 Tracheostomy status: Secondary | ICD-10-CM | POA: Diagnosis not present

## 2021-12-19 DIAGNOSIS — D334 Benign neoplasm of spinal cord: Secondary | ICD-10-CM | POA: Diagnosis not present

## 2021-12-19 DIAGNOSIS — J9600 Acute respiratory failure, unspecified whether with hypoxia or hypercapnia: Secondary | ICD-10-CM

## 2021-12-19 DIAGNOSIS — F32A Depression, unspecified: Secondary | ICD-10-CM

## 2021-12-19 DIAGNOSIS — N179 Acute kidney failure, unspecified: Secondary | ICD-10-CM

## 2021-12-19 DIAGNOSIS — I5030 Unspecified diastolic (congestive) heart failure: Secondary | ICD-10-CM

## 2021-12-19 DIAGNOSIS — G9341 Metabolic encephalopathy: Secondary | ICD-10-CM

## 2021-12-19 DIAGNOSIS — J9601 Acute respiratory failure with hypoxia: Secondary | ICD-10-CM | POA: Diagnosis not present

## 2021-12-19 DIAGNOSIS — G4730 Sleep apnea, unspecified: Secondary | ICD-10-CM

## 2021-12-19 DIAGNOSIS — E66813 Obesity, class 3: Secondary | ICD-10-CM

## 2021-12-19 DIAGNOSIS — I480 Paroxysmal atrial fibrillation: Secondary | ICD-10-CM

## 2021-12-19 LAB — CBC
HCT: 34.8 % — ABNORMAL LOW (ref 39.0–52.0)
HCT: 37.3 % — ABNORMAL LOW (ref 39.0–52.0)
Hemoglobin: 11.2 g/dL — ABNORMAL LOW (ref 13.0–17.0)
Hemoglobin: 11.8 g/dL — ABNORMAL LOW (ref 13.0–17.0)
MCH: 28.9 pg (ref 26.0–34.0)
MCH: 29.1 pg (ref 26.0–34.0)
MCHC: 32.2 g/dL (ref 30.0–36.0)
MCHC: 31.6 g/dL (ref 30.0–36.0)
MCV: 89.9 fL (ref 80.0–100.0)
MCV: 92.1 fL (ref 80.0–100.0)
Platelets: 195 10*3/uL (ref 150–400)
Platelets: 250 10*3/uL (ref 150–400)
RBC: 3.87 MIL/uL — ABNORMAL LOW (ref 4.22–5.81)
RBC: 4.05 MIL/uL — ABNORMAL LOW (ref 4.22–5.81)
RDW: 13.2 % (ref 11.5–15.5)
RDW: 12.9 % (ref 11.5–15.5)
WBC: 4 10*3/uL (ref 4.0–10.5)
WBC: 7.7 10*3/uL (ref 4.0–10.5)
nRBC: 0 % (ref 0.0–0.2)
nRBC: 0 % (ref 0.0–0.2)

## 2021-12-19 LAB — GLUCOSE, CAPILLARY
Glucose-Capillary: 123 mg/dL — ABNORMAL HIGH (ref 70–99)
Glucose-Capillary: 124 mg/dL — ABNORMAL HIGH (ref 70–99)
Glucose-Capillary: 136 mg/dL — ABNORMAL HIGH (ref 70–99)
Glucose-Capillary: 138 mg/dL — ABNORMAL HIGH (ref 70–99)
Glucose-Capillary: 152 mg/dL — ABNORMAL HIGH (ref 70–99)
Glucose-Capillary: 170 mg/dL — ABNORMAL HIGH (ref 70–99)

## 2021-12-19 LAB — BASIC METABOLIC PANEL
Anion gap: 8 (ref 5–15)
Anion gap: 8 (ref 5–15)
BUN: 27 mg/dL — ABNORMAL HIGH (ref 8–23)
BUN: 34 mg/dL — ABNORMAL HIGH (ref 8–23)
CO2: 26 mmol/L (ref 22–32)
CO2: 24 mmol/L (ref 22–32)
Calcium: 8.2 mg/dL — ABNORMAL LOW (ref 8.9–10.3)
Calcium: 8.8 mg/dL — ABNORMAL LOW (ref 8.9–10.3)
Chloride: 109 mmol/L (ref 98–111)
Chloride: 111 mmol/L (ref 98–111)
Creatinine, Ser: 1.58 mg/dL — ABNORMAL HIGH (ref 0.61–1.24)
Creatinine, Ser: 2.48 mg/dL — ABNORMAL HIGH (ref 0.61–1.24)
GFR, Estimated: 49 mL/min — ABNORMAL LOW (ref >60)
GFR, Estimated: 28 mL/min — ABNORMAL LOW (ref >60)
Glucose, Bld: 140 mg/dL — ABNORMAL HIGH (ref 70–99)
Glucose, Bld: 162 mg/dL — ABNORMAL HIGH (ref 70–99)
Potassium: 4.1 mmol/L (ref 3.5–5.1)
Potassium: 4.3 mmol/L (ref 3.5–5.1)
Sodium: 143 mmol/L (ref 135–145)
Sodium: 143 mmol/L (ref 135–145)

## 2021-12-19 LAB — BLOOD GAS, VENOUS
Acid-base deficit: 1.8 mmol/L (ref 0.0–2.0)
Bicarbonate: 24.7 mmol/L (ref 20.0–28.0)
O2 Saturation: 71.3 %
Patient temperature: 37
pCO2, Ven: 48 mmHg (ref 44–60)
pH, Ven: 7.32 (ref 7.25–7.43)
pO2, Ven: 43 mmHg (ref 32–45)

## 2021-12-19 LAB — MAGNESIUM: Magnesium: 2.1 mg/dL (ref 1.7–2.4)

## 2021-12-19 LAB — CULTURE, RESPIRATORY W GRAM STAIN: Culture: NO GROWTH

## 2021-12-19 LAB — PHOSPHORUS: Phosphorus: 4 mg/dL (ref 2.5–4.6)

## 2021-12-19 IMAGING — DX DG CHEST 1V PORT
1 series · 1 of 1 positions shown · non-contrast
Comparison: Radiograph [DATE]

CLINICAL DATA: Hypoxia, trach present.

EXAM:
PORTABLE CHEST 1 VIEW

[chest]
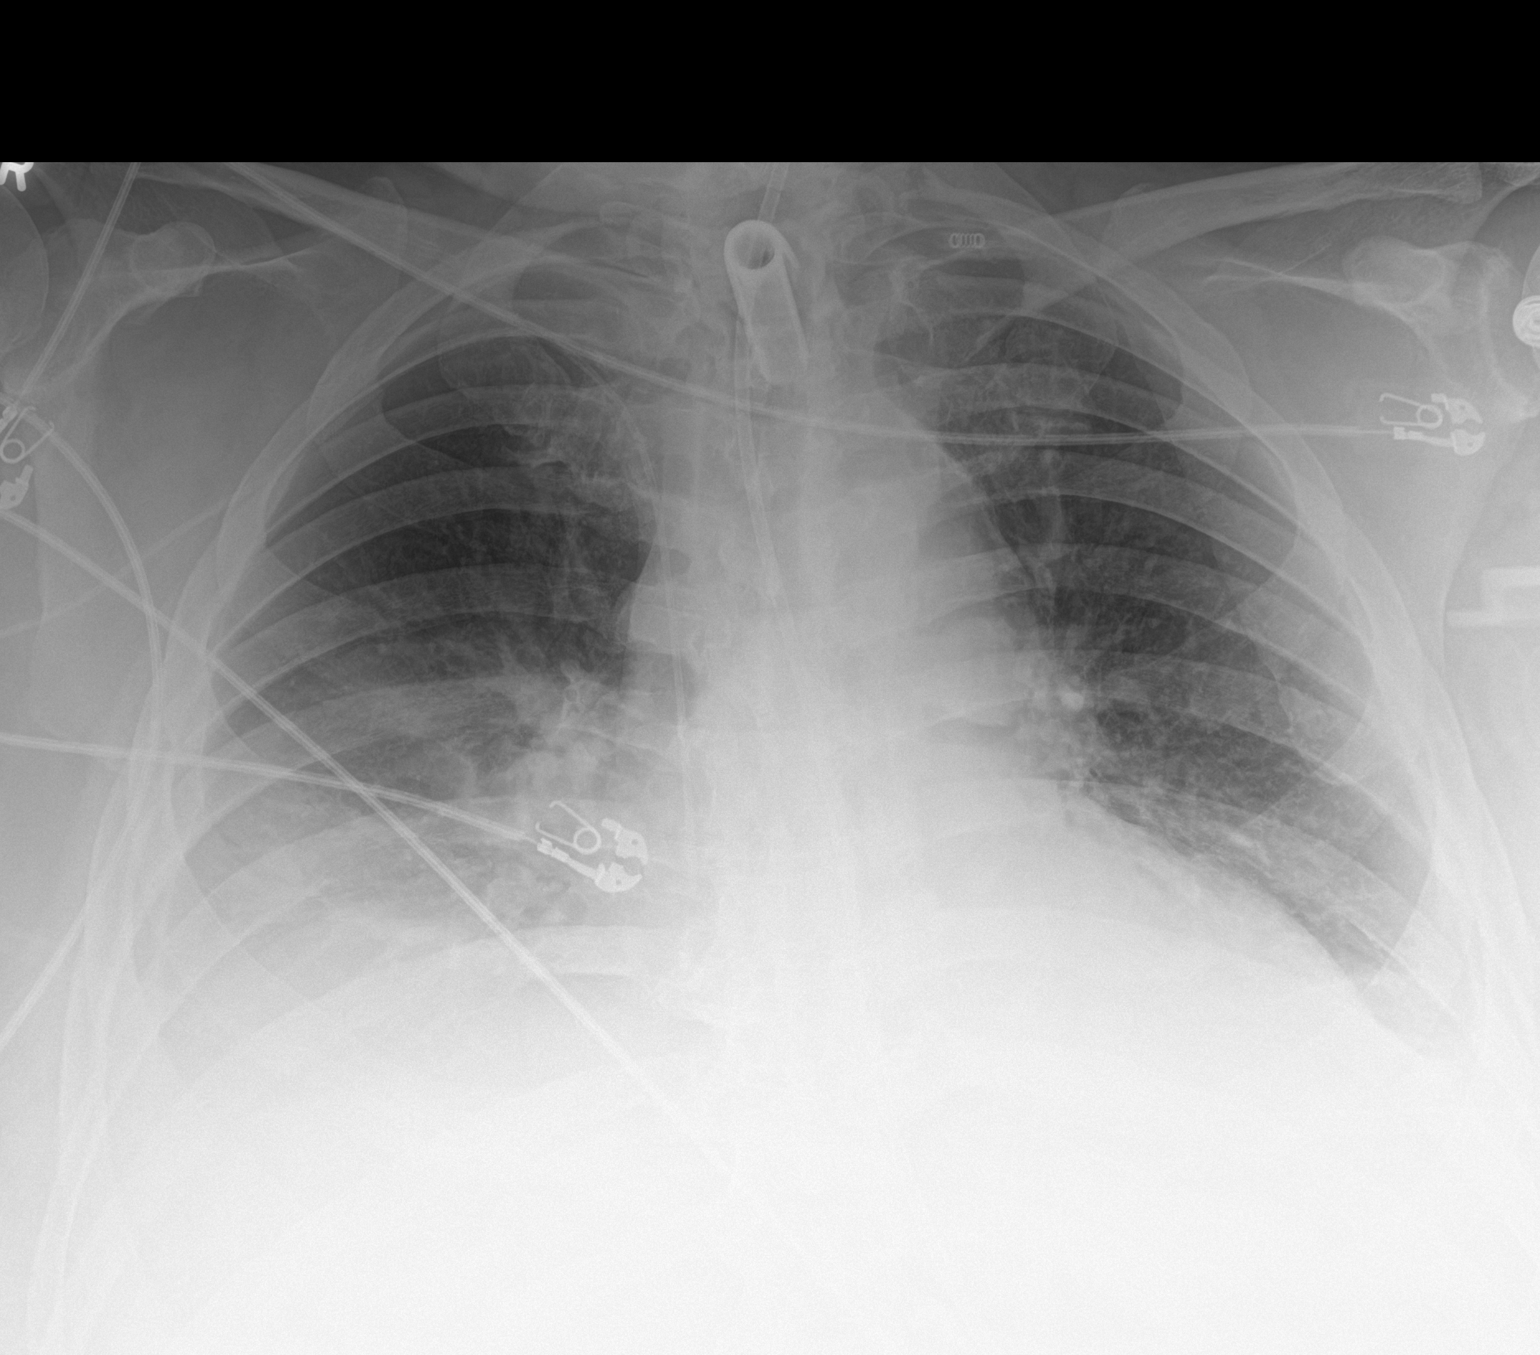

[1 of 1 positions shown; findings below may reference images not displayed]

FINDINGS: Tracheostomy tube overlies the central air column. Enteric feeding
catheter courses below the diaphragm with tip obscured by
collimation. Right upper extremity PICC with tip overlying the right
atrium.

The heart size and mediastinal contours are unchanged.

Reduced lung volumes bilaterally. Similar veiling opacities in the
bilateral lung bases consistent with pleural effusions and
consolidations.

No acute osseous abnormality.
IMPRESSION: Similar veiling opacities in the bilateral lung bases consistent
with pleural effusions and consolidations.

## 2021-12-19 IMAGING — DX DG CHEST 1V PORT
1 series · 1 of 1 positions shown · non-contrast
Comparison: [DATE] at [DATE] a.m.

CLINICAL DATA: Hypoxia, tracheostomy tube

EXAM:
PORTABLE CHEST 1 VIEW

[chest]
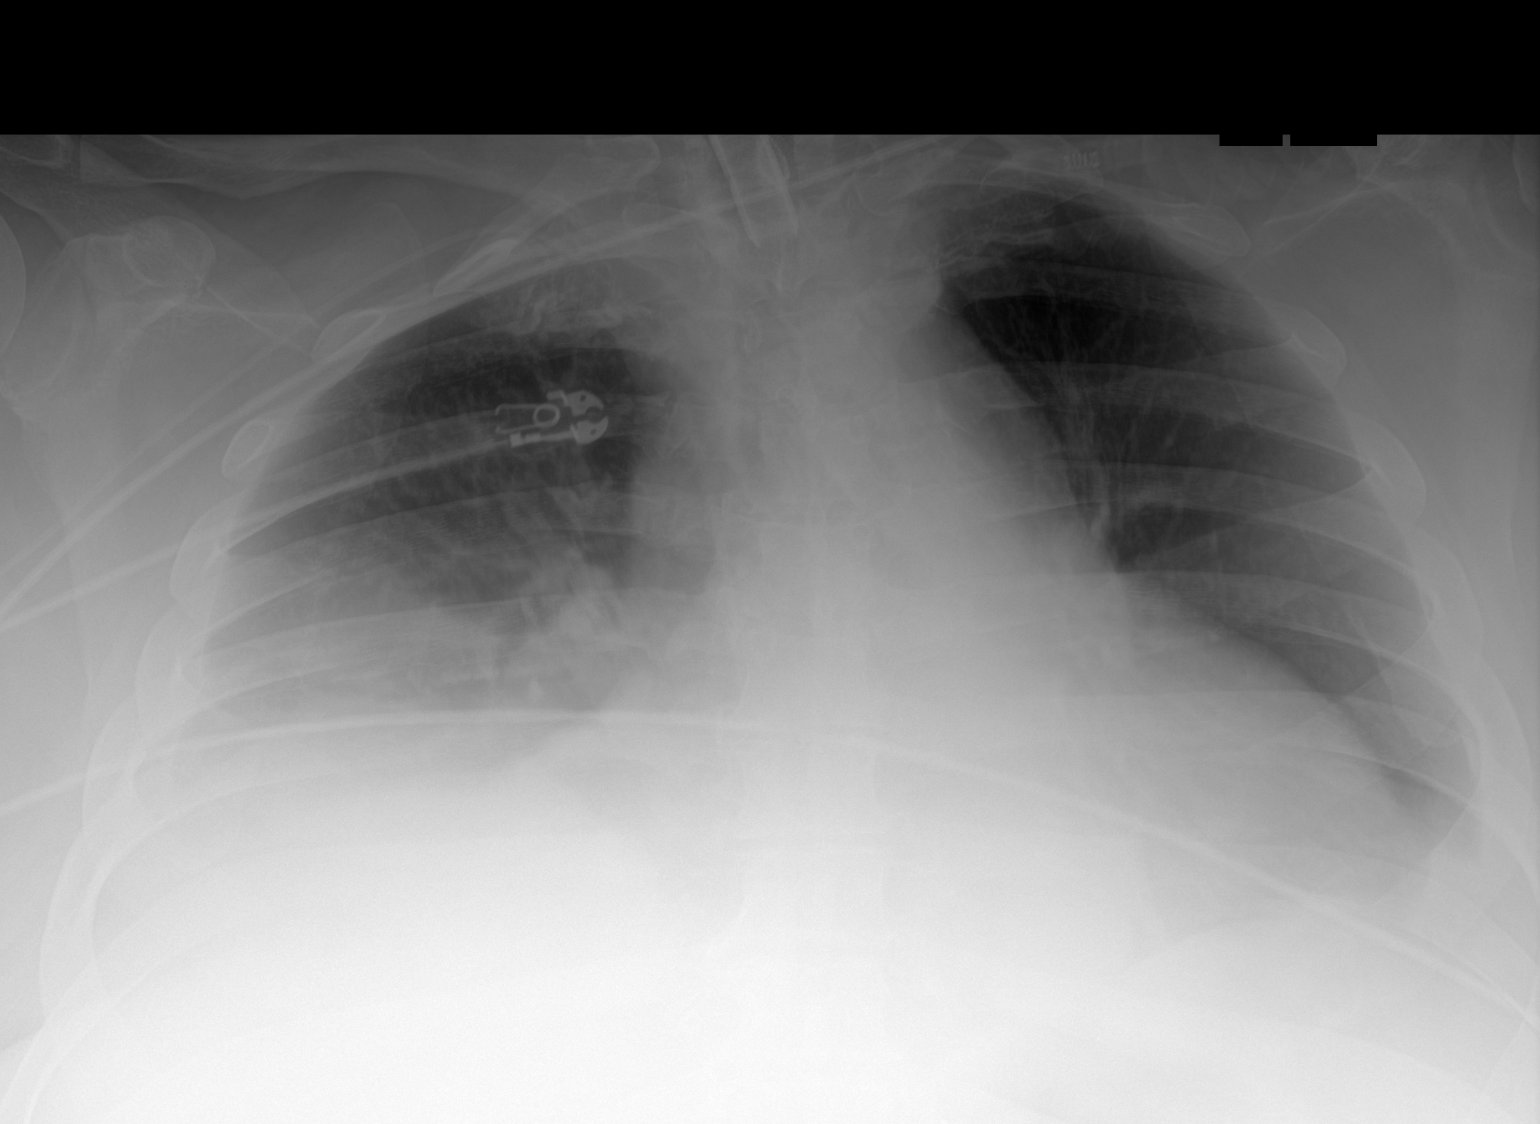

[1 of 1 positions shown; findings below may reference images not displayed]

FINDINGS: Single frontal view of the chest demonstrates interval removal of
the enteric catheter. Tracheostomy tube and right-sided PICC
unchanged. Cardiac silhouette is enlarged. Persistent bilateral
veiling opacities, right greater than left. Decreased lung volumes
since prior study. No pneumothorax.
IMPRESSION: 1. Decreased lung volumes, with persistent bibasilar veiling
opacities consistent with effusions and consolidation.

## 2021-12-19 IMAGING — DX DG ABD PORTABLE 1V
1 series · 1 of 1 positions shown · non-contrast
Comparison: [DATE]

CLINICAL DATA: To check for the feeding tube placement

EXAM:
PORTABLE ABDOMEN - 1 VIEW

[abdomen]
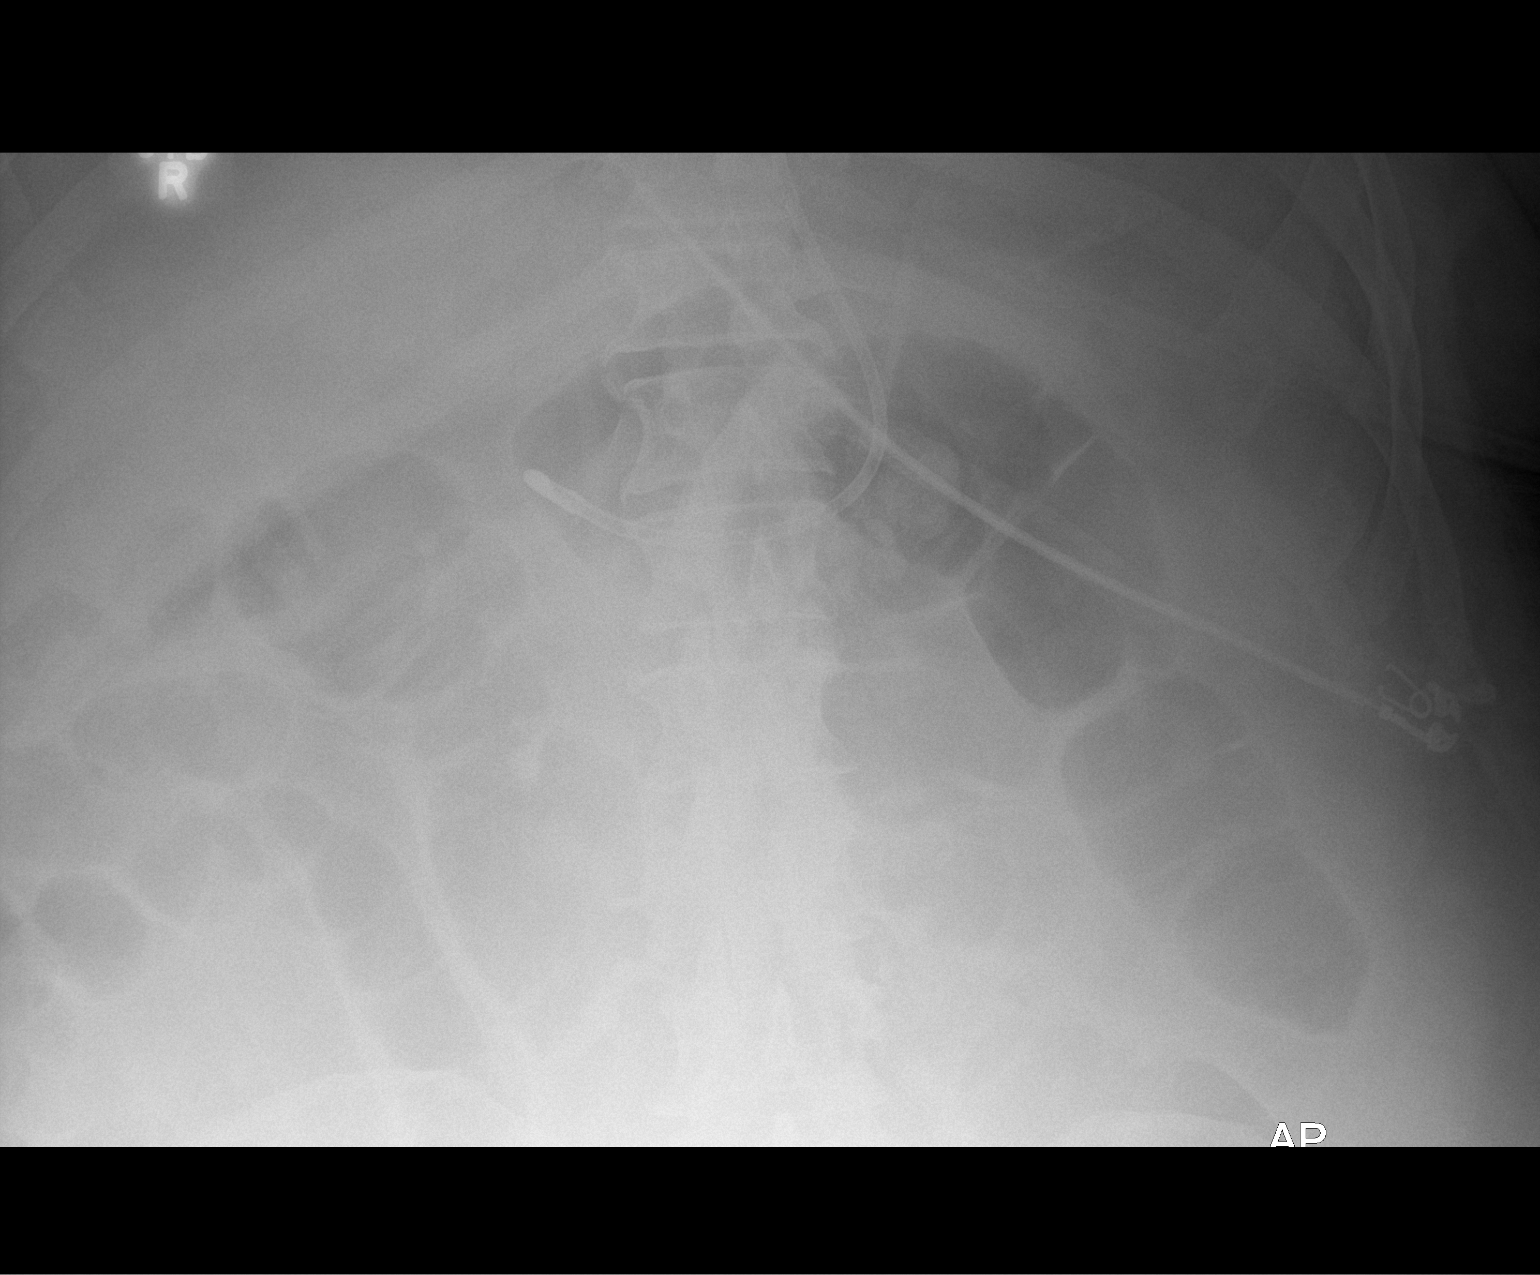

[1 of 1 positions shown; findings below may reference images not displayed]

FINDINGS: The tip of the feeding tube now seen at the distal gastric antrum in
comparison to pylorus in the previous study. There is moderate
gaseous distention of the small bowel loops seen with dilated small
bowel loop measuring 5.8 cm in greatest dimension at the left upper
abdomen.
IMPRESSION: Tip of the feeding tube is seen at the distal gastric antrum in
comparison to pylorus in the previous study. Moderate gaseous
distention of the small bowel loops and has increased in the
interim.

## 2021-12-19 MED ORDER — NOREPINEPHRINE 4 MG/250ML-% IV SOLN: 0.0000 ug/min | INTRAVENOUS | Status: DC

## 2021-12-19 MED ORDER — SENNA 8.6 MG PO TABS: 1.0000 | ORAL_TABLET | Freq: Two times a day (BID) | ORAL | Status: DC

## 2021-12-19 MED ORDER — MIDAZOLAM HCL 2 MG/2ML IJ SOLN
1.0000 mg | INTRAMUSCULAR | Status: DC | PRN
  Administered 2021-12-19 – 2021-12-20 (×3): 1 mg via INTRAVENOUS
  Filled 2021-12-19 (×4): qty 2

## 2021-12-19 MED ORDER — DOCUSATE SODIUM 50 MG/5ML PO LIQD: 100.0000 mg | Freq: Two times a day (BID) | ORAL | Status: DC

## 2021-12-19 MED ORDER — NOREPINEPHRINE 4 MG/250ML-% IV SOLN
INTRAVENOUS | Status: AC
  Administered 2021-12-19: 2 ug/min via INTRAVENOUS
  Filled 2021-12-19: qty 250

## 2021-12-19 MED ORDER — FENTANYL 2500MCG IN NS 250ML (10MCG/ML) PREMIX INFUSION
50.0000 ug/h | INTRAVENOUS | Status: DC
  Administered 2021-12-19: 50 ug/h via INTRAVENOUS
  Filled 2021-12-19: qty 250

## 2021-12-19 MED ORDER — BETHANECHOL CHLORIDE 10 MG PO TABS
10.0000 mg | ORAL_TABLET | Freq: Three times a day (TID) | ORAL | Status: DC
  Administered 2021-12-19 – 2021-12-20 (×2): 10 mg
  Filled 2021-12-19 (×2): qty 1

## 2021-12-19 MED ORDER — POLYETHYLENE GLYCOL 3350 17 G PO PACK: 17.0000 g | PACK | Freq: Every day | ORAL | Status: DC

## 2021-12-19 MED ORDER — MELATONIN 3 MG PO TABS: 3.0000 mg | ORAL_TABLET | Freq: Every day | ORAL | Status: DC

## 2021-12-19 MED ORDER — DEXTROSE 5 % IV SOLN
100.0000 mg | Freq: Two times a day (BID) | INTRAVENOUS | Status: DC
  Filled 2021-12-19 (×2): qty 10

## 2021-12-19 MED ORDER — FENTANYL BOLUS VIA INFUSION
50.0000 ug | INTRAVENOUS | Status: DC | PRN
  Administered 2021-12-20: 50 ug via INTRAVENOUS
  Administered 2021-12-20: 100 ug via INTRAVENOUS
  Administered 2021-12-20 (×2): 50 ug via INTRAVENOUS

## 2021-12-19 MED ORDER — MIDAZOLAM HCL 2 MG/2ML IJ SOLN
2.0000 mg | Freq: Once | INTRAMUSCULAR | Status: AC
  Administered 2021-12-19: 2 mg via INTRAVENOUS
  Filled 2021-12-19: qty 2

## 2021-12-19 MED ORDER — ALBUMIN HUMAN 25 % IV SOLN
12.5000 g | Freq: Two times a day (BID) | INTRAVENOUS | Status: AC
  Administered 2021-12-19 (×2): 12.5 g via INTRAVENOUS
  Filled 2021-12-19 (×2): qty 50

## 2021-12-19 MED ORDER — FENTANYL CITRATE PF 50 MCG/ML IJ SOSY
50.0000 ug | PREFILLED_SYRINGE | Freq: Once | INTRAMUSCULAR | Status: AC
  Administered 2021-12-19: 50 ug via INTRAVENOUS
  Filled 2021-12-19: qty 1

## 2021-12-19 MED ORDER — DEXTROSE 10 % IV SOLN: INTRAVENOUS | Status: DC

## 2021-12-19 MED ORDER — POTASSIUM CHLORIDE 20 MEQ PO PACK
40.0000 meq | PACK | Freq: Once | ORAL | Status: AC
  Administered 2021-12-19: 40 meq
  Filled 2021-12-19: qty 2

## 2021-12-19 MED ORDER — QUETIAPINE FUMARATE 200 MG PO TABS
200.0000 mg | ORAL_TABLET | Freq: Two times a day (BID) | ORAL | Status: DC
Start: 2021-12-19 — End: 2021-12-20
  Administered 2021-12-19 – 2021-12-20 (×2): 200 mg
  Filled 2021-12-19 (×2): qty 1

## 2021-12-19 MED ORDER — FUROSEMIDE 10 MG/ML IJ SOLN
100.0000 mg | Freq: Two times a day (BID) | INTRAVENOUS | Status: DC
  Administered 2021-12-19 (×2): 100 mg via INTRAVENOUS
  Filled 2021-12-19 (×6): qty 10

## 2021-12-19 MED ORDER — CLONAZEPAM 1 MG PO TABS: 1.0000 mg | ORAL_TABLET | Freq: Two times a day (BID) | ORAL | Status: DC

## 2021-12-19 MED ORDER — ENOXAPARIN SODIUM 80 MG/0.8ML IJ SOSY
70.0000 mg | PREFILLED_SYRINGE | Freq: Every day | INTRAMUSCULAR | Status: DC
  Administered 2021-12-19: 70 mg via SUBCUTANEOUS
  Filled 2021-12-19 (×2): qty 0.7

## 2021-12-19 NOTE — Progress Notes (Signed)
Neurosurgery Service ?Progress Note ? ?Subjective: No acute events overnight, some agitation with desats, FiO2 increased to 70 ? ?Objective: ?Vitals:  ? 12/19/21 0500 12/19/21 0600 12/19/21 0700 12/19/21 0800  ?BP: 116/63 112/60 127/73 123/64  ?Pulse: 74 72 74 70  ?Resp: 18 18 (!) 23 17  ?Temp:    (!) 100.6 ?F (38.1 ?C)  ?TempSrc:    Axillary  ?SpO2: 94% 95% 96% 96%  ?Weight:      ?Height:      ? ? ?Physical Exam: ?Intubated, sedated, but follows commands x4 with full strength, incision c/d/i ? ?Assessment & Plan: ?66 y.o. 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 5/11 s/p uneventful C1/C2 laminectomies and resection of tumor, prelim path schwannoma, 5/12 Abx for PNA 5/12 & 5/13 extubated and reintubated x2, 5/14 s/p trach, MRI w/ GTR, improved cord mass effect & dec'd signal change ? ?-CCM recs ?-continues to be neurosurgically stable ? ?Adam Moreno A Adam Moreno  ?12/19/21 ?8:39 AM ? ?

## 2021-12-19 NOTE — Progress Notes (Signed)
Pt found to be anxious, HR in the 140s, oxygen saturation down. Inline suctioned. Cortrack found to be next to patient out of pt's nose. RT at bedside.  ?

## 2021-12-19 NOTE — Progress Notes (Signed)
eLink Physician-Brief Progress Note ?Patient Name: Adam Moreno ?DOB: 1956/07/05 ?MRN: [id number] ? ? ?Date of Service ? 12/19/2021  ?HPI/Events of Note ? CVP = 31 according to nursing. Patient getting scheduled Lasix 100 mg IV now.   ?eICU Interventions ? Plan: ?Continue present management.  ?D/C D10W for now.  ?Will request that the PCCM ground team evaluate the patient at bedside.   ? ? ? ?Intervention Category ?Major Interventions: Other: ? ?Khamila Bassinger Cornelia Copa ?12/19/2021, 9:47 PM ?

## 2021-12-19 NOTE — Procedures (Signed)
Cortrak ? ?Tube Type:  Cortrak - 43 inches ?Tube Location:  Right nare ?Initial Placement:  Stomach ?Secured by: Bridle ?Technique Used to Measure Tube Placement:  Marking at nare/corner of mouth ?Cortrak Secured At:  70 cm ? ?Cortrak Tube Team Note: ? ?Consult received to place a Cortrak feeding tube.  ? ?X-ray is required, abdominal x-ray has been ordered by the Cortrak team. Please confirm tube placement before using the Cortrak tube.  ? ?If the tube becomes dislodged please keep the tube and contact the Cortrak team at [email or web address] (password [email or web address]) for replacement.  ?If after hours and replacement cannot be delayed, place a NG tube and confirm placement with an abdominal x-ray.  ? ? ?Ashrith Sagan MS, RD, LDN ?Please refer to AMION for RD and/or RD on-call/weekend/after hours pager ? ? ?

## 2021-12-19 NOTE — Progress Notes (Signed)
PCCM Interval Progress Note ? ?PCCM ground team contacted by E-Link with request to evaluate patient at bedside for desaturation, agitation despite increased sedation on Precedex/increased PEEP. ? ?On arrival, patient saturating 88 to 90% on vent, tachypneic to 40s, clearly agitated but following commands appropriately.  RRT bagging intermittently, Appropriate volumes on ventilator.  Unfortunately, patient's blood pressure is somewhat labile and significantly decreased with Versed administration. Precedex does not seem to be adequately controlling patient's anxiety/agitation, will attempt to transition to fentanyl gtt +/- propofol as pressures tolerate.  Precedex discontinued. ? ?Adam Mount, PA-C ?[facility] ?12/19/21 9:59 PM ? ?Please see [email or web address] for pager details. ? ?From 7A-7P if no response, please call [phone] ?After hours, please call ELink [phone] ?

## 2021-12-19 NOTE — Progress Notes (Addendum)
? ?NAME:  Adam Moreno, MRN:  [id number], DOB:  October 05, 1955, LOS: 8 ?ADMISSION DATE:  12/25/2021 CONSULTATION DATE:  12/28/2021 ?REFERRING MD:  Zada Finders - NSGY CHIEF COMPLAINT:  Hypotension  ? ?History of Present Illness:  ?66 year old man who presented to [facility] 5/9 for planned laminectomy/surgical resection of C1-C2 nerve sheath tumor with NSGY (Dr. Zada Finders). 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 ? ?Patient presented for scheduled laminectomy/nerve sheath tumor resection 5/9. Post-induction/intubation prior to the procedure, patient became hypotensive and it was unclear if he had a pulse (true pulselessness versus instrumentation). Epi was administered x 1 and a short course of CPR was completed with return of palpable pulse. Case was aborted. Patient was successfully extubated in the OR with appropriate mental status. Placed on Neosynephrine for BP support. Patient was transferred to ICU for further workup. ?PCCM was consulted for help with evaluation and medical management ? ?Pertinent Medical History:  ? ?Past Medical History:  ?Diagnosis Date  ? Depression   ? Hypertension   ? Sleep apnea   ? doesn't wear CPAP  ? ?Significant Hospital Events: ?Including procedures, antibiotic start and stop dates in addition to other pertinent events   ?5/9 - Presented to [facility] for planned C1-C2 nerve sheath tumor resection/laminectomy. Hypotensive shortly after induction/intubation requiring case to be aborted. Extubated in OR. Transferred to ICU for further cardiac workup. ?5/12 extubated, Afib, cardiac arrest, emergently reintubated ?5/14 extubated, desaturated to 30s%, emergently reintubated> trached ?5/15 started on empiric antibiotics, PICC placed, CVC & foley out ?5/17 increasing seroquel. Stopping dilaudid gtt. Working on diuresis (9 liters +) ? ?Interim History / Subjective:  ?Still agitated  ? ?Objective:  ?Blood pressure  123/64, pulse 70, temperature (Abnormal) 100.6 ?F (38.1 ?C), temperature source Axillary, resp. rate 17, height 6' (1.829 m), weight (Abnormal) 154.2 kg, SpO2 96 %. ?   ?Vent Mode: PRVC ?FiO2 (%):  [40 %-70 %] 70 % ?Set Rate:  [18 bmp] 18 bmp ?Vt Set:  [094 mL] 630 mL ?PEEP:  [8 cmH20] 8 cmH20 ?Plateau Pressure:  [17 cmH20-19 cmH20] 19 cmH20  ? ?Intake/Output Summary (Last 24 hours) at 12/19/2021 0859 ?Last data filed at 12/19/2021 0600 ?Alber per 24 hour  ?Intake 2242.5 ml  ?Output 2550 ml  ?Net -307.5 ml  ? ?Filed Weights  ? 12/21/2021 0950 12/14/21 0500 12/15/21 0500  ?Weight: (Abnormal) 147.4 kg (Abnormal) 149.6 kg (Abnormal) 154.2 kg  ? ?Physical Examination: ? ?General 66 year old WM currently resting on PSV and anxious but re-directable  ?HENT NCAT trach site clean and dry  ?Pulm clear. No accessory use. VTs 700s on PSV w/out evidence of accessory use ?Card rrr ?Abd soft  ?Ext generalized edema brisk CR ?Neuro awake. Confused and anxious. Moves all ext. No focal def ?GU cl yellow  ? ?Resolved Hospital Problem List:  ?Medication related Hypotension anesthesia  ?Hypokalemia/hypomagnesemia  ? ?Assessment & Plan:  ?Principal Problem: ?  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  Acute respiratory failure ([place]) ?  Tracheostomy dependence ([place]) ?  Aspiration pneumonia ([place]) ?  Atelectasis ?  Sleep apnea ?  Obesity, Class III, BMI 40-49.9 (morbid obesity) ([place]) ?  AKI (acute kidney injury) ([place]) ?  (HFpEF) heart failure with preserved ejection fraction ([place]) ?  Acute metabolic encephalopathy ?  PAF (paroxysmal atrial fibrillation) ([place]) ?  Depression ?  Mixed hyperlipidemia ?  Hypertension ?  Anemia ? ? ?Acute hypoxic respiratory failure,  now  [employer]  dependent s/p failed extubation x2; suspect aspiration PNA superimposed on hypoventilation and OSA, complicated by delirium and anxiety ?Plan ?Dc dilaudid gtt ?Cont precedex  ?Cont PSV and ATC later today once he's off gtt ?VAP bundle  ?PAD Protocol ?Lasix today  (will do albumin and lasix chaser)->may increase dosing tomorrow if still w. Sig volume excess ?Cefepime day 3 (abx day 7); prob stop cefepime at day 5 (total 10d)  ?Am CXR ? ? ?Acute metabolic encephalopathy (multifactorial) ?Anxiety ?Plan ?Dc dilaudid gtt ?Cont precedex ?Cont Clonaz ?Increase Seroquel to 200 bid (am qtc)  ? ?New diagnosis of atrial fibrillation with RVR, converted to sinus rhythm ?Acute on chronic HFpEF ?Plan ?Cont tele ?No AC unless has recurrent AF ?Lasix addressed above  ? ? ?Schwannoma of C1-C2 nerve sheath, s/p C1-C2 laminectomy ?Plan ?PT/OT/SLP ?Post op per surg  ? ?AKI (cr bumped again but had some hypotension) ?Plan ?Cont to trend chems ?Avoid nephrotoxins ?Strict I&O ?Avoid hypotension  ?OK to cont lasix for now  ? ? ?Fluid and Electrolyte imbalance: (intermittent) ?Plan ?Cont to trend and replace as indicated.  ? ?Morbid obesity ?OSA, noncompliant with CPAP ?Plan ?Tubefeeds ? ?Anemia due to critical illness ?-no evidence of bleeding ?Plan ?Trend cbc ?Trigger for transfusion < 7  ? ?Hypertension ?Home meds: amlodipine, lasix, metoprolol succ, valsartan-HCTZ ?Plan ?Holding PTA meds for now  ? ?Best Practice: (right click and "Reselect all SmartList Selections" daily)  ?Diet: TF ?GI: PPI ?DVT: enoxaparin ?Family updates:  wife updated at bedside 5/16 ? ?My cct 34 min ? ?[name] ACNP-BC ?[place] ?Pager # [phone] OR # [phone] if no answer ? ? ?

## 2021-12-19 NOTE — Progress Notes (Signed)
eLink Physician-Brief Progress Note ?Patient Name: Adam Moreno ?DOB: Jun 26, 1956 ?MRN: [id number] ? ? ?Date of Service ? 12/19/2021  ?HPI/Events of Note ? CXR reveals decreased lung volumes, with persistent bibasilar veiling ?opacities consistent with effusions and consolidation.  ?eICU Interventions ? Plan: ?Increase PEEP to 10.   ? ? ? ?Intervention Category ?Major Interventions: Hypoxemia - evaluation and management ? ?Donyel Castagnola Cornelia Copa ?12/19/2021, 9:27 PM ?

## 2021-12-19 NOTE — Progress Notes (Signed)
12 mL diluadid wasted in stericycle with Chrys Racer RN  ?

## 2021-12-19 NOTE — Progress Notes (Addendum)
eLink Physician-Brief Progress Note ?Patient Name: Adam Moreno ?DOB: 1956-04-16 ?MRN: [id number] ? ? ?Date of Service ? 12/19/2021  ?HPI/Events of Note ? Agitation  Ventilator Asynchrony - Precedex IV infusion at ceiling. Pulled out CorTrak which had tube feeding running. Nurse also reports rhonchi and crackles on lung exam.  ?eICU Interventions ? Plan: ?Increase ceiling of Precedex IV infusion to 1.6 mcg/kg/hour.  ?Versed 2 mg IV X 1 now.  ?D10W IV infusion at 40 mL/hour.  ?Portable CXR STAT.   ? ? ? ?Intervention Category ?Major Interventions: Delirium, psychosis, severe agitation - evaluation and management ? ?Azhar Yogi Cornelia Copa ?12/19/2021, 8:42 PM ?

## 2021-12-19 NOTE — Progress Notes (Signed)
Physical Therapy Treatment ?Patient Details ?Name: Adam Moreno ?MRN: [id number] ?DOB: 07/28/1956 ?Today's Date: 12/19/2021 ? ? ?History of Present Illness 66 y.o. 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 5/11 s/p uneventful C1/C2 laminectomies and resection of tumor, prelim path schwannoma, 5/12 Abx for PNA 5/12 & 5/13 extubated and reintubated x2, 5/14 s/p trach, MRI w/ GTR, improved cord mass effect & dec'd signal change. ? ?  ?PT Comments  ? ? Patient with slight improved arousal this session over last session.  Still anxious, but tolerating trach collar well without drop in SpO2.  RN in to assist and pt on EOB for linen and gown change, but leaning hard R and did not help much with return to supine nor scooting up in bed.  Patient will continue to benefit from skilled PT in the acute setting and likely to need follow up acute inpatient rehab at d/c.    ?Recommendations for follow up therapy are one component of a multi-disciplinary discharge planning process, led by the attending physician.  Recommendations may be updated based on patient status, additional functional criteria and insurance authorization. ? ?Follow Up Recommendations ? Acute inpatient rehab (3hours/day) ?  ?  ?Assistance Recommended at Discharge Frequent or constant Supervision/Assistance  ?Patient can return home with the following A lot of help with bathing/dressing/bathroom;Assistance with cooking/housework;Assist for transportation;Help with stairs or ramp for entrance;A lot of help with walking and/or transfers ?  ?Equipment Recommendations ? Other (comment) (TBA)  ?  ?Recommendations for Other Services   ? ? ?  ?Precautions / Restrictions Precautions ?Precautions: Fall;Cervical  ?  ? ?Mobility ? Bed Mobility ?Overal bed mobility: Needs Assistance ?Bed Mobility: Rolling, Sidelying to Sit, Sit  to Supine ?Rolling: Mod assist ?Sidelying to sit: Max assist, +2 for safety/equipment ?  ?Sit to supine: +2 for safety/equipment, Max assist ?  ?General bed mobility comments: increased time for arousal and pt with minimal movements to assist, initially rolled to R and assisted legs off bed, attempted to assist lifting trunk, pt pushing to return to laying down, rested with head on bed, then assisted with +2 to sit upright and get feet on the floor; sat EOB for RN to change linen on bed and changed pt's gown as ice pack had leaked; to supine with A for trunk, legs, then rolled to remove soiled linen and +3-4 to scoot to [facility] ?  ? ?Transfers ?  ?  ?  ?  ?  ?  ?  ?  ?  ?  ?  ? ?Ambulation/Gait ?  ?  ?  ?  ?  ?  ?  ?  ? ? ?Stairs ?  ?  ?  ?  ?  ? ? ?Wheelchair Mobility ?  ? ?Modified Rankin (Stroke Patients Only) ?  ? ? ?  ?Balance Overall balance assessment: Needs assistance ?Sitting-balance support: Feet supported, Single extremity supported, Bilateral upper extremity supported ?Sitting balance-Leahy Scale: Poor ?Sitting balance - Comments: max progressing to mod A for sitting balance EOB ?Postural control: Right lateral lean ?  ?  ?  ?  ?  ?  ?  ?  ?  ?  ?  ?  ?  ?  ?  ? ?  ?Cognition Arousal/Alertness: Lethargic ?Behavior During Therapy: Anxious ?Overall Cognitive Status: Difficult to assess ?  ?  ?  ?  ?  ?  ?  ?  ?  ?  ?  ?  ?  ?  ?  ?  ?  ?  ?  ? ?  ?  Exercises   ? ?  ?General Comments General comments (skin integrity, edema, etc.): on trach collar 60% FiO2 @ 10L with VSS but pt anxious as attempting to sit up and pushing against for a bit ?  ?  ? ?Pertinent Vitals/Pain Pain Assessment ?Faces Pain Scale: Hurts even more ?Pain Location: generalized with movement ?Pain Descriptors / Indicators: Discomfort, Guarding, Grimacing ?Pain Intervention(s): Monitored during session, Limited activity within patient's tolerance, Repositioned  ? ? ?Home Living   ?  ?  ?  ?  ?  ?  ?  ?  ?  ?   ?  ?Prior Function    ?  ?  ?    ? ?PT Goals (current goals can now be found in the care plan section) Progress towards PT goals: Progressing toward goals ? ?  ?Frequency ? ? ? Min 4X/week ? ? ? ?  ?PT Plan Current plan remains appropriate  ? ? ?Co-evaluation   ?  ?  ?  ?  ? ?  ?AM-PAC PT "6 Clicks" Mobility   ?Outcome Measure ? Help needed turning from your back to your side while in a flat bed without using bedrails?: Total ?Help needed moving from lying on your back to sitting on the side of a flat bed without using bedrails?: Total ?Help needed moving to and from a bed to a chair (including a wheelchair)?: Total ?Help needed standing up from a chair using your arms (e.g., wheelchair or bedside chair)?: Total ?Help needed to walk in hospital room?: Total ?Help needed climbing 3-5 steps with a railing? : Total ?6 Click Score: 6 ? ?  ?End of Session Equipment Utilized During Treatment: Oxygen ?Activity Tolerance: Patient limited by lethargy ?Patient left: in bed;with call bell/phone within reach;with restraints reapplied ?  ?PT Visit Diagnosis: Other abnormalities of gait and mobility (R26.89);Muscle weakness (generalized) (M62.81);Other symptoms and signs involving the nervous system (R29.898) ?  ? ? ?Time: 5397-6734 ?PT Time Calculation (min) (ACUTE ONLY): 30 min ? ?Charges:  $Therapeutic Activity: 23-37 mins          ?          ? ?Adam Moreno, PT ?Acute Rehabilitation Services ?LPFXT:[phone] ?Office:[phone] ?12/19/2021 ? ? ? ?Adam Moreno ?12/19/2021, 5:16 PM ? ?

## 2021-12-19 NOTE — Progress Notes (Addendum)
SLP Cancellation Note ? ?Patient Details ?Name: Adam Moreno ?MRN: [id number] ?DOB: 1955/12/31 ? ? ?Cancelled treatment:       Reason Eval/Treat Not Completed: Fatigue/lethargy limiting ability to participate (Pt has been on trach collar since 1400. Pt's RN reported that pt is very lethargic and it was agreed that PMV evaluation will be deferred today. SLP will follow up on subsequent date.) ? ?Yechiel Erny I. Hardin Negus, [place], CCC-SLP ?Acute Rehabilitation Services ?Office number [phone] ?Pager [phone] ? ?Horton Marshall ?12/19/2021, 3:11 PM ?

## 2021-12-19 NOTE — Progress Notes (Signed)
? ?  Inpatient Rehab Admissions Coordinator : ? ?Per therapy recommendations patient was screened for CIR candidacy by Danne Baxter RN MSN. Patient is not yet at a level to tolerate the intensity required to pursue a CIR admit. Noted remains vented. Patient may have the potential to progress to become a candidate. The CIR admissions team will follow and monitor for progress and place a Rehab Consult order if felt to be appropriate. Please contact me with any questions. ? ?Danne Baxter RN MSN ?Admissions Coordinator ?[phone]  ?

## 2021-12-19 NOTE — Progress Notes (Signed)
eLink Physician-Brief Progress Note ?Patient Name: Adam Moreno ?DOB: 22-Nov-1955 ?MRN: [id number] ? ? ?Date of Service ? 12/19/2021  ?HPI/Events of Note ? Hypotension - BP = 76/62 with MAP = 67.   ?eICU Interventions ? Plan: ?Norepinephrine IV infusion. Titrate to MAP >= 65.   ? ? ? ?Intervention Category ?Major Interventions: Hypotension - evaluation and management ? ?Lounette Sloan Cornelia Copa ?12/19/2021, 11:41 PM ?

## 2021-12-20 ENCOUNTER — Inpatient Hospital Stay (HOSPITAL_COMMUNITY): Payer: BC Managed Care – PPO

## 2021-12-20 DIAGNOSIS — G9341 Metabolic encephalopathy: Secondary | ICD-10-CM | POA: Diagnosis not present

## 2021-12-20 DIAGNOSIS — J9601 Acute respiratory failure with hypoxia: Secondary | ICD-10-CM | POA: Diagnosis not present

## 2021-12-20 DIAGNOSIS — R0603 Acute respiratory distress: Secondary | ICD-10-CM | POA: Diagnosis not present

## 2021-12-20 DIAGNOSIS — I5031 Acute diastolic (congestive) heart failure: Secondary | ICD-10-CM

## 2021-12-20 DIAGNOSIS — J9811 Atelectasis: Secondary | ICD-10-CM

## 2021-12-20 DIAGNOSIS — R34 Anuria and oliguria: Secondary | ICD-10-CM

## 2021-12-20 DIAGNOSIS — J690 Pneumonitis due to inhalation of food and vomit: Secondary | ICD-10-CM

## 2021-12-20 DIAGNOSIS — R509 Fever, unspecified: Secondary | ICD-10-CM

## 2021-12-20 DIAGNOSIS — I952 Hypotension due to drugs: Secondary | ICD-10-CM

## 2021-12-20 DIAGNOSIS — E8770 Fluid overload, unspecified: Secondary | ICD-10-CM

## 2021-12-20 DIAGNOSIS — E8729 Other acidosis: Secondary | ICD-10-CM

## 2021-12-20 DIAGNOSIS — N179 Acute kidney failure, unspecified: Secondary | ICD-10-CM | POA: Diagnosis not present

## 2021-12-20 LAB — COMPREHENSIVE METABOLIC PANEL
ALT: 183 U/L — ABNORMAL HIGH (ref 0–44)
ALT: 184 U/L — ABNORMAL HIGH (ref 0–44)
AST: 253 U/L — ABNORMAL HIGH (ref 15–41)
AST: 270 U/L — ABNORMAL HIGH (ref 15–41)
Albumin: 2.8 g/dL — ABNORMAL LOW (ref 3.5–5.0)
Albumin: 2.5 g/dL — ABNORMAL LOW (ref 3.5–5.0)
Alkaline Phosphatase: 63 U/L (ref 38–126)
Alkaline Phosphatase: 54 U/L (ref 38–126)
Anion gap: 10 (ref 5–15)
Anion gap: 20 — ABNORMAL HIGH (ref 5–15)
BUN: 36 mg/dL — ABNORMAL HIGH (ref 8–23)
BUN: 35 mg/dL — ABNORMAL HIGH (ref 8–23)
CO2: 21 mmol/L — ABNORMAL LOW (ref 22–32)
CO2: 19 mmol/L — ABNORMAL LOW (ref 22–32)
Calcium: 8.1 mg/dL — ABNORMAL LOW (ref 8.9–10.3)
Calcium: 7.5 mg/dL — ABNORMAL LOW (ref 8.9–10.3)
Chloride: 113 mmol/L — ABNORMAL HIGH (ref 98–111)
Chloride: 100 mmol/L (ref 98–111)
Creatinine, Ser: 3.18 mg/dL — ABNORMAL HIGH (ref 0.61–1.24)
Creatinine, Ser: 3.21 mg/dL — ABNORMAL HIGH (ref 0.61–1.24)
GFR, Estimated: 21 mL/min — ABNORMAL LOW (ref >60)
GFR, Estimated: 21 mL/min — ABNORMAL LOW (ref >60)
Glucose, Bld: 140 mg/dL — ABNORMAL HIGH (ref 70–99)
Glucose, Bld: 593 mg/dL (ref 70–99)
Potassium: 4.3 mmol/L (ref 3.5–5.1)
Potassium: 4.2 mmol/L (ref 3.5–5.1)
Sodium: 144 mmol/L (ref 135–145)
Sodium: 139 mmol/L (ref 135–145)
Total Bilirubin: 1 mg/dL (ref 0.3–1.2)
Total Bilirubin: 1.2 mg/dL (ref 0.3–1.2)
Total Protein: 6.2 g/dL — ABNORMAL LOW (ref 6.5–8.1)
Total Protein: 5.5 g/dL — ABNORMAL LOW (ref 6.5–8.1)

## 2021-12-20 LAB — LACTIC ACID, PLASMA
Lactic Acid, Venous: 2.1 mmol/L (ref 0.5–1.9)
Lactic Acid, Venous: 2.4 mmol/L (ref 0.5–1.9)
Lactic Acid, Venous: 3.7 mmol/L (ref 0.5–1.9)
Lactic Acid, Venous: 8.9 mmol/L (ref 0.5–1.9)

## 2021-12-20 LAB — DIC (DISSEMINATED INTRAVASCULAR COAGULATION)PANEL
D-Dimer, Quant: >20 ug/mL-FEU — ABNORMAL HIGH (ref 0.00–0.50)
Fibrinogen: 432 mg/dL (ref 210–475)
INR: 2.5 — ABNORMAL HIGH (ref 0.8–1.2)
Platelets: 198 10*3/uL (ref 150–400)
Prothrombin Time: 26.5 seconds — ABNORMAL HIGH (ref 11.4–15.2)
Smear Review: NONE SEEN
aPTT: 60 seconds — ABNORMAL HIGH (ref 24–36)

## 2021-12-20 LAB — BASIC METABOLIC PANEL
Anion gap: 15 (ref 5–15)
BUN: 43 mg/dL — ABNORMAL HIGH (ref 8–23)
CO2: 19 mmol/L — ABNORMAL LOW (ref 22–32)
Calcium: 8 mg/dL — ABNORMAL LOW (ref 8.9–10.3)
Chloride: 110 mmol/L (ref 98–111)
Creatinine, Ser: 4.11 mg/dL — ABNORMAL HIGH (ref 0.61–1.24)
GFR, Estimated: 15 mL/min — ABNORMAL LOW (ref >60)
Glucose, Bld: 141 mg/dL — ABNORMAL HIGH (ref 70–99)
Potassium: 6.6 mmol/L (ref 3.5–5.1)
Sodium: 144 mmol/L (ref 135–145)

## 2021-12-20 LAB — URINALYSIS, ROUTINE W REFLEX MICROSCOPIC
Bilirubin Urine: NEGATIVE
Glucose, UA: NEGATIVE mg/dL
Ketones, ur: NEGATIVE mg/dL
Nitrite: NEGATIVE
Protein, ur: 100 mg/dL — AB
Specific Gravity, Urine: 1.016 (ref 1.005–1.030)
pH: 5 (ref 5.0–8.0)

## 2021-12-20 LAB — POCT I-STAT 7, (LYTES, BLD GAS, ICA,H+H)
Acid-base deficit: 6 mmol/L — ABNORMAL HIGH (ref 0.0–2.0)
Acid-base deficit: 11 mmol/L — ABNORMAL HIGH (ref 0.0–2.0)
Bicarbonate: 20.3 mmol/L (ref 20.0–28.0)
Bicarbonate: 16.9 mmol/L — ABNORMAL LOW (ref 20.0–28.0)
Calcium, Ion: 1.08 mmol/L — ABNORMAL LOW (ref 1.15–1.40)
Calcium, Ion: 0.99 mmol/L — ABNORMAL LOW (ref 1.15–1.40)
HCT: 35 % — ABNORMAL LOW (ref 39.0–52.0)
HCT: 32 % — ABNORMAL LOW (ref 39.0–52.0)
Hemoglobin: 11.9 g/dL — ABNORMAL LOW (ref 13.0–17.0)
Hemoglobin: 10.9 g/dL — ABNORMAL LOW (ref 13.0–17.0)
O2 Saturation: 91 %
O2 Saturation: 93 %
Patient temperature: 99.9
Potassium: 6 mmol/L — ABNORMAL HIGH (ref 3.5–5.1)
Potassium: 6.2 mmol/L — ABNORMAL HIGH (ref 3.5–5.1)
Sodium: 143 mmol/L (ref 135–145)
Sodium: 143 mmol/L (ref 135–145)
TCO2: 22 mmol/L (ref 22–32)
TCO2: 18 mmol/L — ABNORMAL LOW (ref 22–32)
pCO2 arterial: 45.2 mmHg (ref 32–48)
pCO2 arterial: 43.7 mmHg (ref 32–48)
pH, Arterial: 7.264 — ABNORMAL LOW (ref 7.35–7.45)
pH, Arterial: 7.195 — CL (ref 7.35–7.45)
pO2, Arterial: 74 mmHg — ABNORMAL LOW (ref 83–108)
pO2, Arterial: 82 mmHg — ABNORMAL LOW (ref 83–108)

## 2021-12-20 LAB — CBC WITH DIFFERENTIAL/PLATELET
Abs Immature Granulocytes: 0.24 10*3/uL — ABNORMAL HIGH (ref 0.00–0.07)
Basophils Absolute: 0 10*3/uL (ref 0.0–0.1)
Basophils Relative: 0 %
Eosinophils Absolute: 0 10*3/uL (ref 0.0–0.5)
Eosinophils Relative: 0 %
HCT: 34.4 % — ABNORMAL LOW (ref 39.0–52.0)
Hemoglobin: 11 g/dL — ABNORMAL LOW (ref 13.0–17.0)
Immature Granulocytes: 2 %
Lymphocytes Relative: 7 %
Lymphs Abs: 0.9 10*3/uL (ref 0.7–4.0)
MCH: 29.9 pg (ref 26.0–34.0)
MCHC: 32 g/dL (ref 30.0–36.0)
MCV: 93.5 fL (ref 80.0–100.0)
Monocytes Absolute: 1.1 10*3/uL — ABNORMAL HIGH (ref 0.1–1.0)
Monocytes Relative: 9 %
Neutro Abs: 9.9 10*3/uL — ABNORMAL HIGH (ref 1.7–7.7)
Neutrophils Relative %: 82 %
Platelets: 198 10*3/uL (ref 150–400)
RBC: 3.68 MIL/uL — ABNORMAL LOW (ref 4.22–5.81)
RDW: 13.3 % (ref 11.5–15.5)
WBC: 12.1 10*3/uL — ABNORMAL HIGH (ref 4.0–10.5)
nRBC: 0.2 % (ref 0.0–0.2)

## 2021-12-20 LAB — RENAL FUNCTION PANEL
Albumin: 2.6 g/dL — ABNORMAL LOW (ref 3.5–5.0)
Anion gap: 16 — ABNORMAL HIGH (ref 5–15)
BUN: 43 mg/dL — ABNORMAL HIGH (ref 8–23)
CO2: 16 mmol/L — ABNORMAL LOW (ref 22–32)
Calcium: 7.6 mg/dL — ABNORMAL LOW (ref 8.9–10.3)
Chloride: 112 mmol/L — ABNORMAL HIGH (ref 98–111)
Creatinine, Ser: 4.52 mg/dL — ABNORMAL HIGH (ref 0.61–1.24)
GFR, Estimated: 14 mL/min — ABNORMAL LOW (ref >60)
Glucose, Bld: 112 mg/dL — ABNORMAL HIGH (ref 70–99)
Phosphorus: 7.8 mg/dL — ABNORMAL HIGH (ref 2.5–4.6)
Potassium: 6.9 mmol/L (ref 3.5–5.1)
Sodium: 144 mmol/L (ref 135–145)

## 2021-12-20 LAB — MAGNESIUM: Magnesium: 1.9 mg/dL (ref 1.7–2.4)

## 2021-12-20 LAB — URINALYSIS, MICROSCOPIC (REFLEX)

## 2021-12-20 LAB — ECHOCARDIOGRAM LIMITED
Height: 72 in
S' Lateral: 3 cm
Weight: 5439.19 oz

## 2021-12-20 LAB — COOXEMETRY PANEL
Carboxyhemoglobin: 0.8 % (ref 0.5–1.5)
Methemoglobin: <0.7 % (ref 0.0–1.5)
O2 Saturation: 62.7 %
Total hemoglobin: 9.9 g/dL — ABNORMAL LOW (ref 12.0–16.0)

## 2021-12-20 LAB — CREATININE, URINE, RANDOM: Creatinine, Urine: 168.93 mg/dL

## 2021-12-20 LAB — TROPONIN I (HIGH SENSITIVITY)
Troponin I (High Sensitivity): 8875 ng/L (ref <18)
Troponin I (High Sensitivity): 9108 ng/L (ref <18)

## 2021-12-20 LAB — SODIUM, URINE, RANDOM: Sodium, Ur: 62 mmol/L

## 2021-12-20 LAB — BRAIN NATRIURETIC PEPTIDE: B Natriuretic Peptide: 806.5 pg/mL — ABNORMAL HIGH (ref 0.0–100.0)

## 2021-12-20 LAB — PROCALCITONIN: Procalcitonin: 4.48 ng/mL

## 2021-12-20 LAB — GLUCOSE, CAPILLARY
Glucose-Capillary: 150 mg/dL — ABNORMAL HIGH (ref 70–99)
Glucose-Capillary: 122 mg/dL — ABNORMAL HIGH (ref 70–99)
Glucose-Capillary: 138 mg/dL — ABNORMAL HIGH (ref 70–99)
Glucose-Capillary: 104 mg/dL — ABNORMAL HIGH (ref 70–99)

## 2021-12-20 LAB — PHOSPHORUS: Phosphorus: 3.2 mg/dL (ref 2.5–4.6)

## 2021-12-20 IMAGING — DX DG CHEST 1V PORT
1 series · 1 of 1 positions shown · non-contrast
Comparison: [DATE]

CLINICAL DATA: Pneumonia

EXAM:
PORTABLE CHEST 1 VIEW

[chest ap]
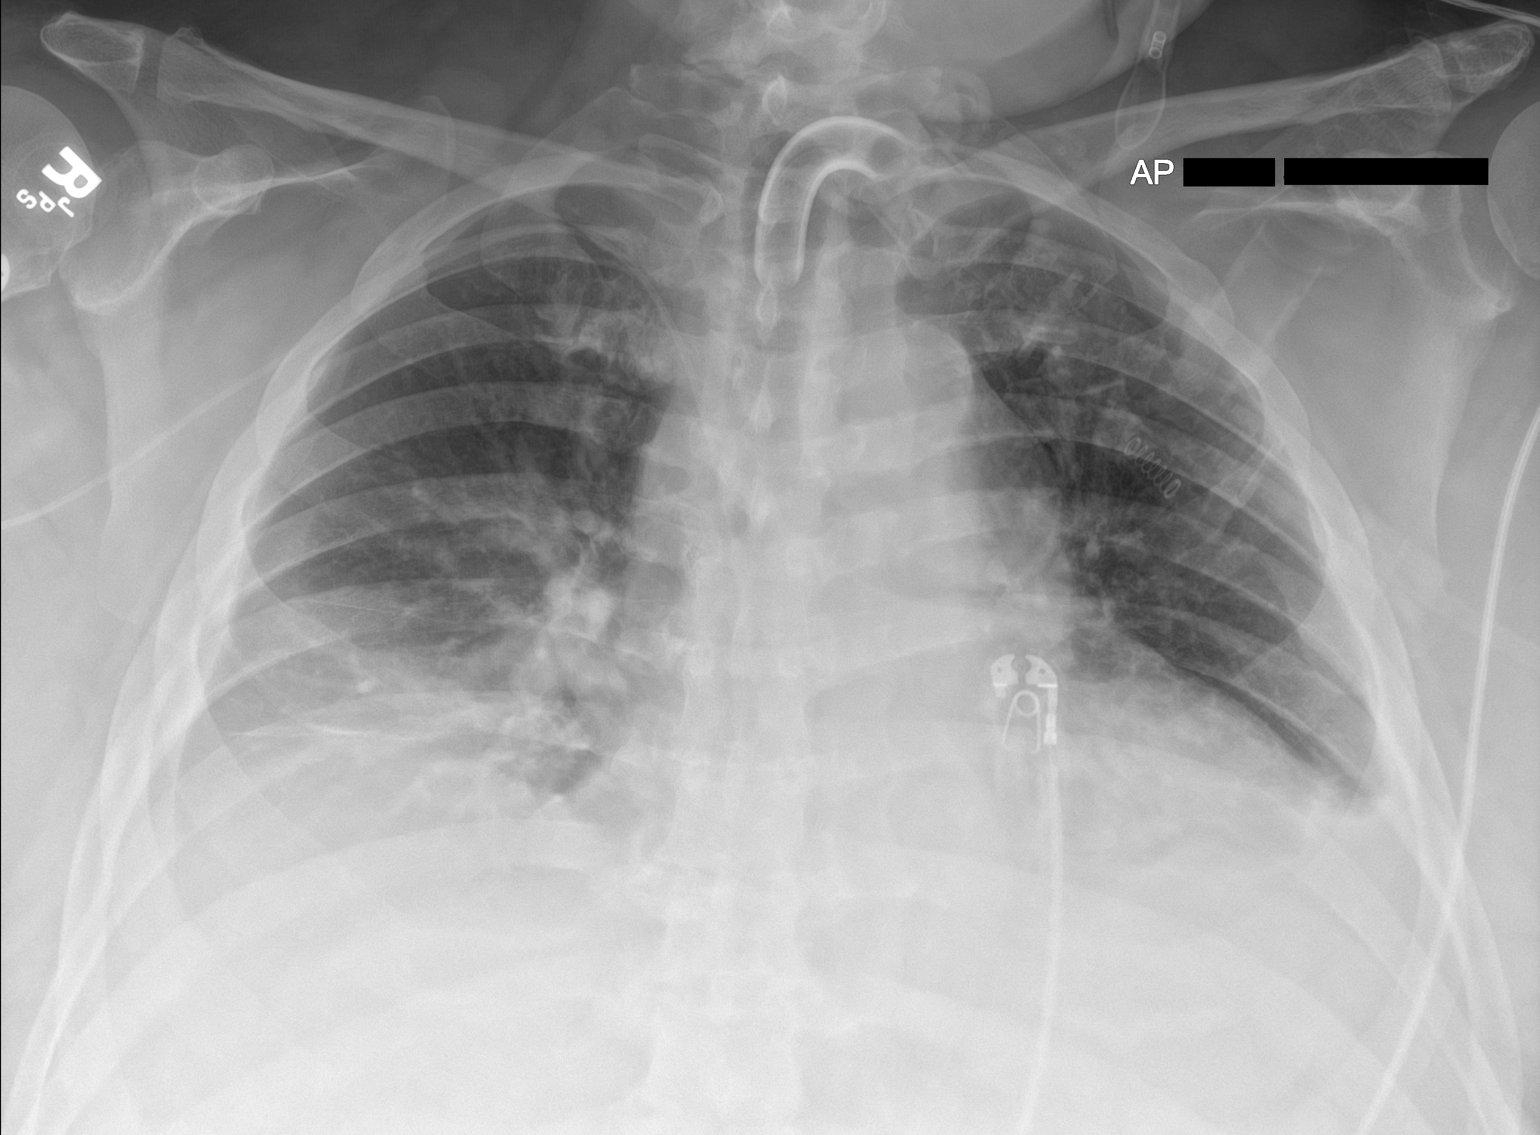

[1 of 1 positions shown; findings below may reference images not displayed]

FINDINGS: Tracheostomy tube and right-sided PICC line remain in place. Stable
cardiomegaly. Low lung volumes. Persistent small bilateral pleural
effusions with associated bibasilar opacities. No pneumothorax.
IMPRESSION: Persistent small bilateral pleural effusions with associated
bibasilar opacities.

## 2021-12-20 IMAGING — US US RENAL
1 series · 14 of 25 positions shown · non-contrast
Comparison: None Available.

CLINICAL DATA: Acute kidney injury

EXAM:
RENAL / URINARY TRACT ULTRASOUND COMPLETE

[Series 1: us renal · 14 of 43 slices shown]
[im 1/43]
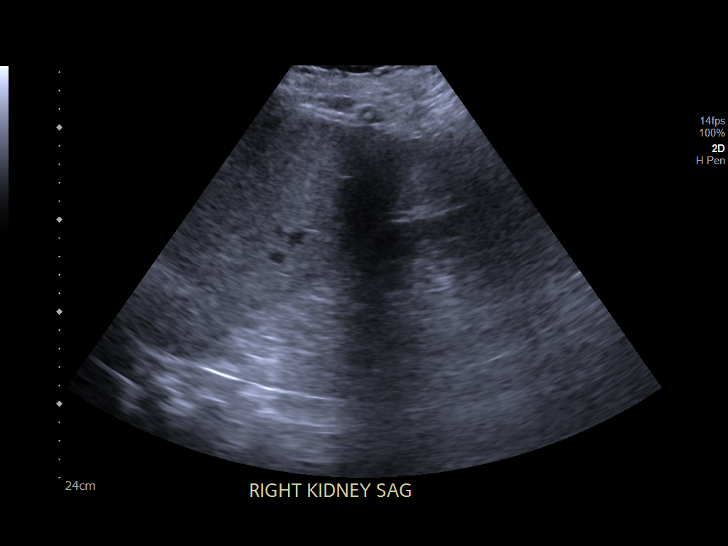
[im 4/43]
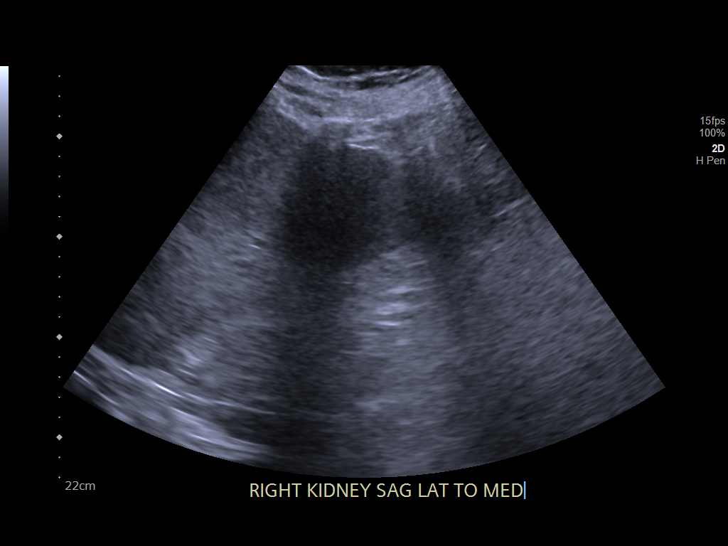
[im 8/43]
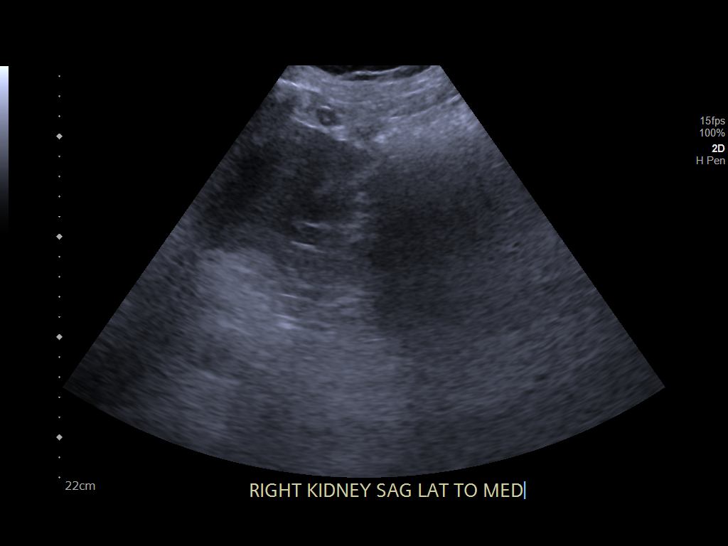
[im 11/43]
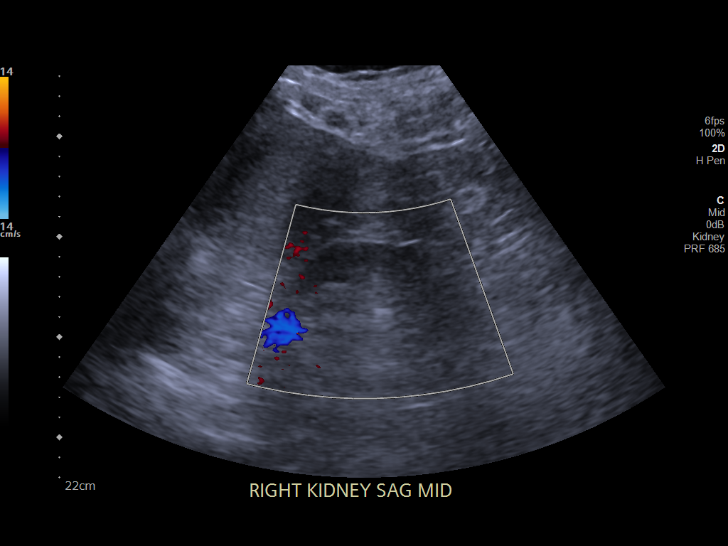
[im 15/43]
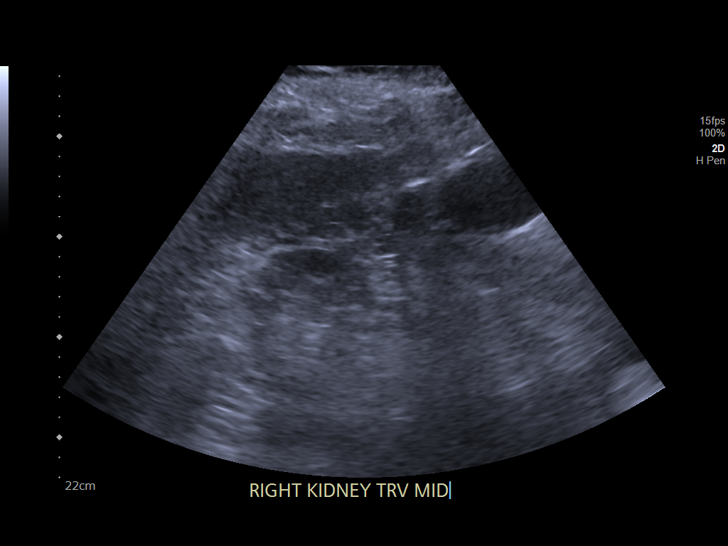
[im 16/43]
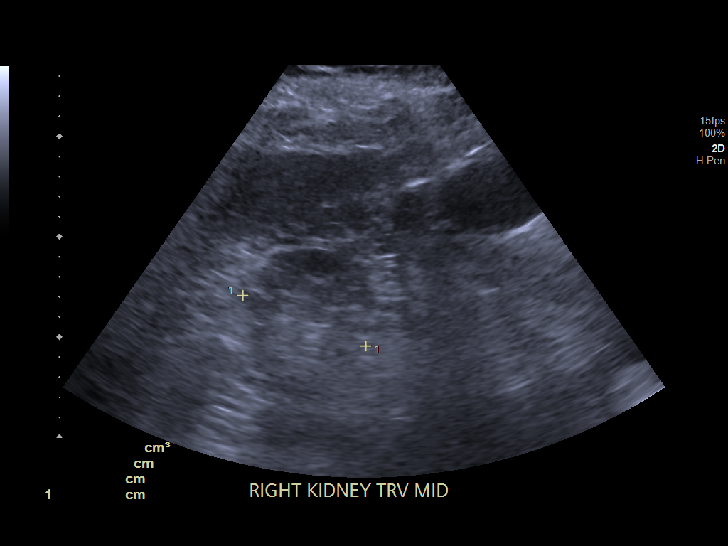
[im 20/43]
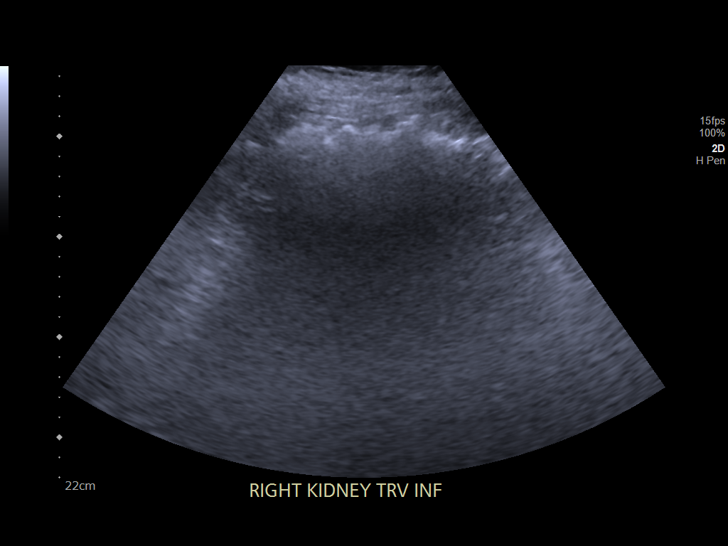
[im 23/43]
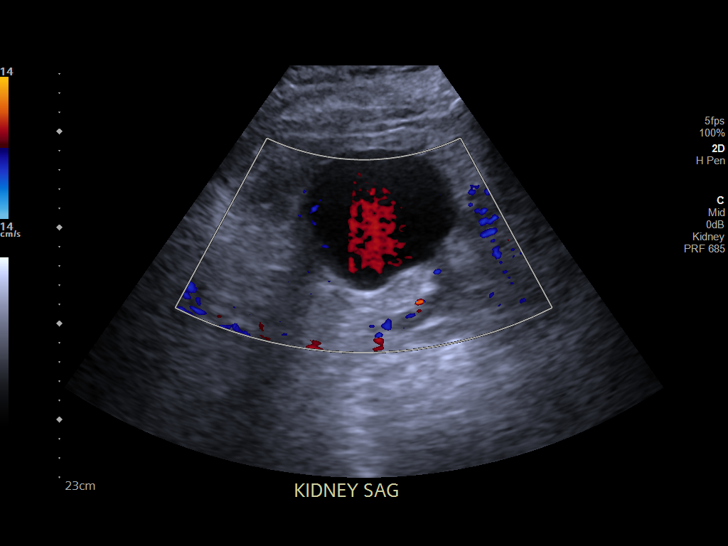
[im 27/43]
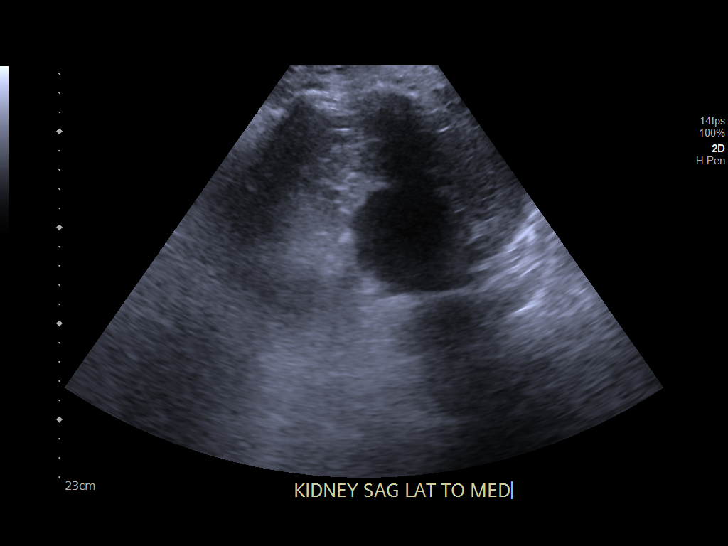
[im 29/43]
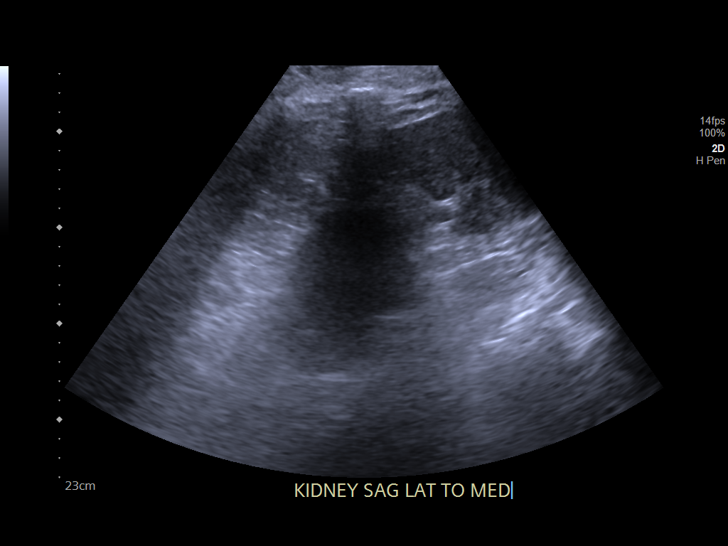
[im 32/43]
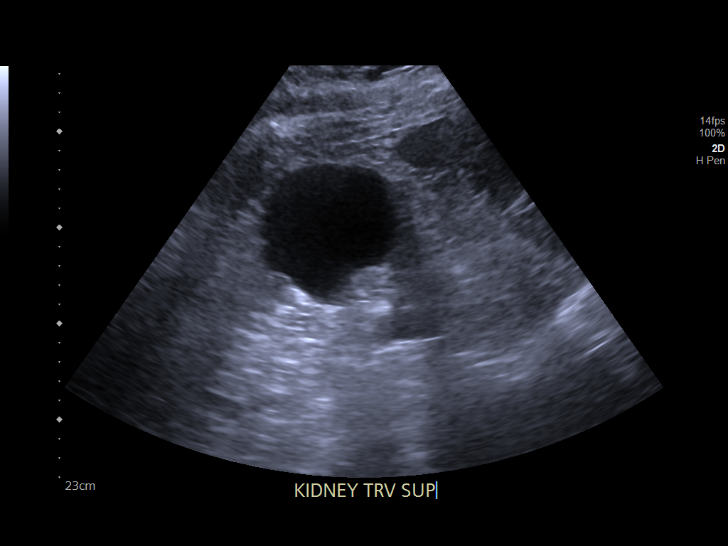
[im 36/43]
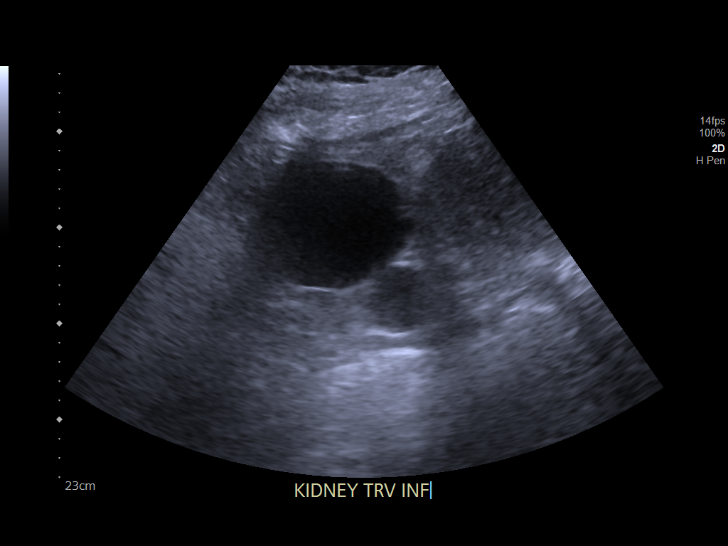
[im 39/43]
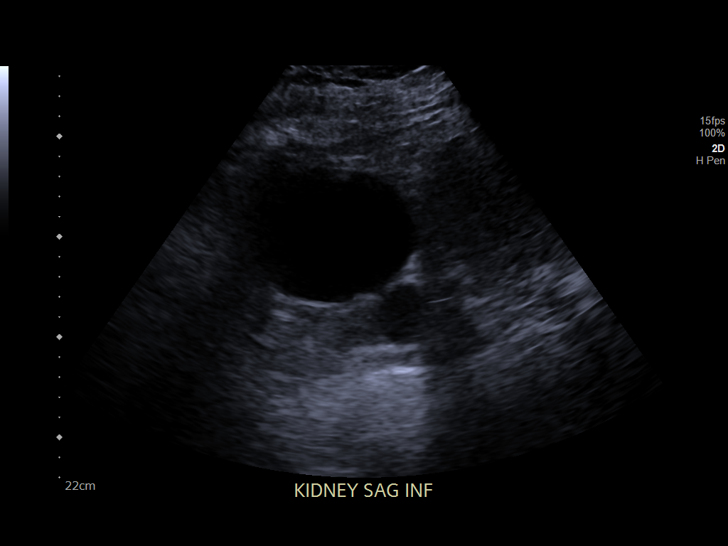
[im 43/43]
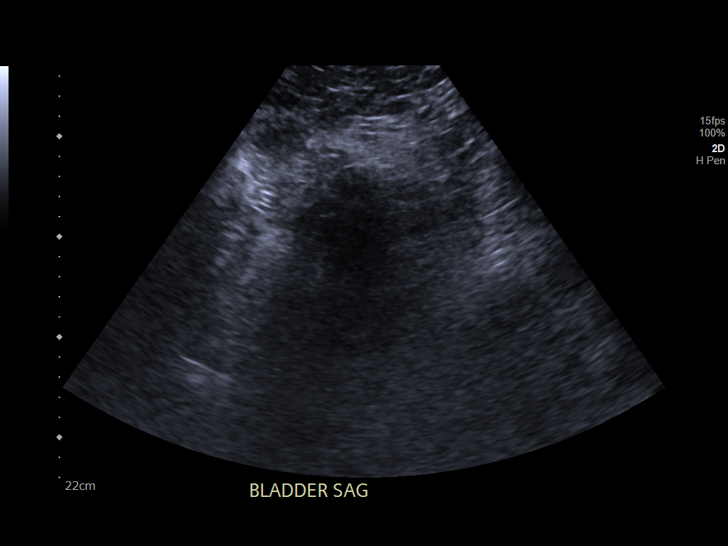

[14 of 25 positions shown; findings below may reference images not displayed]

FINDINGS: Right Kidney:

Renal measurements: 11.2 x 7.4 x 6.6 = volume: 290 mL. Increased
cortical echogenicity. No mass or hydronephrosis visualized.

Left Kidney:

Renal measurements: 6.3 x 4.7 x 3.1 cm = volume: 49 mL. Increased
cortical echogenicity. 8 cm anechoic renal cysts which appear benign
requiring no follow-up.

Bladder:

Appears normal for degree of bladder distention.

Other:

Study is limited by patient habitus and disposition (intubated and
sedated)
IMPRESSION: 1. No hydronephrosis.
2. Atrophic left kidney.
3. Increased cortical echogenicity bilaterally, may reflect artifact
or suggest medical renal disease.

## 2021-12-20 IMAGING — DX DG ABDOMEN 1V
1 series · 1 of 1 positions shown · non-contrast
Comparison: Chest x-ray obtained earlier today

CLINICAL DATA: Confirm G-tube placement

EXAM:
ABDOMEN - 1 VIEW

[abdomen]
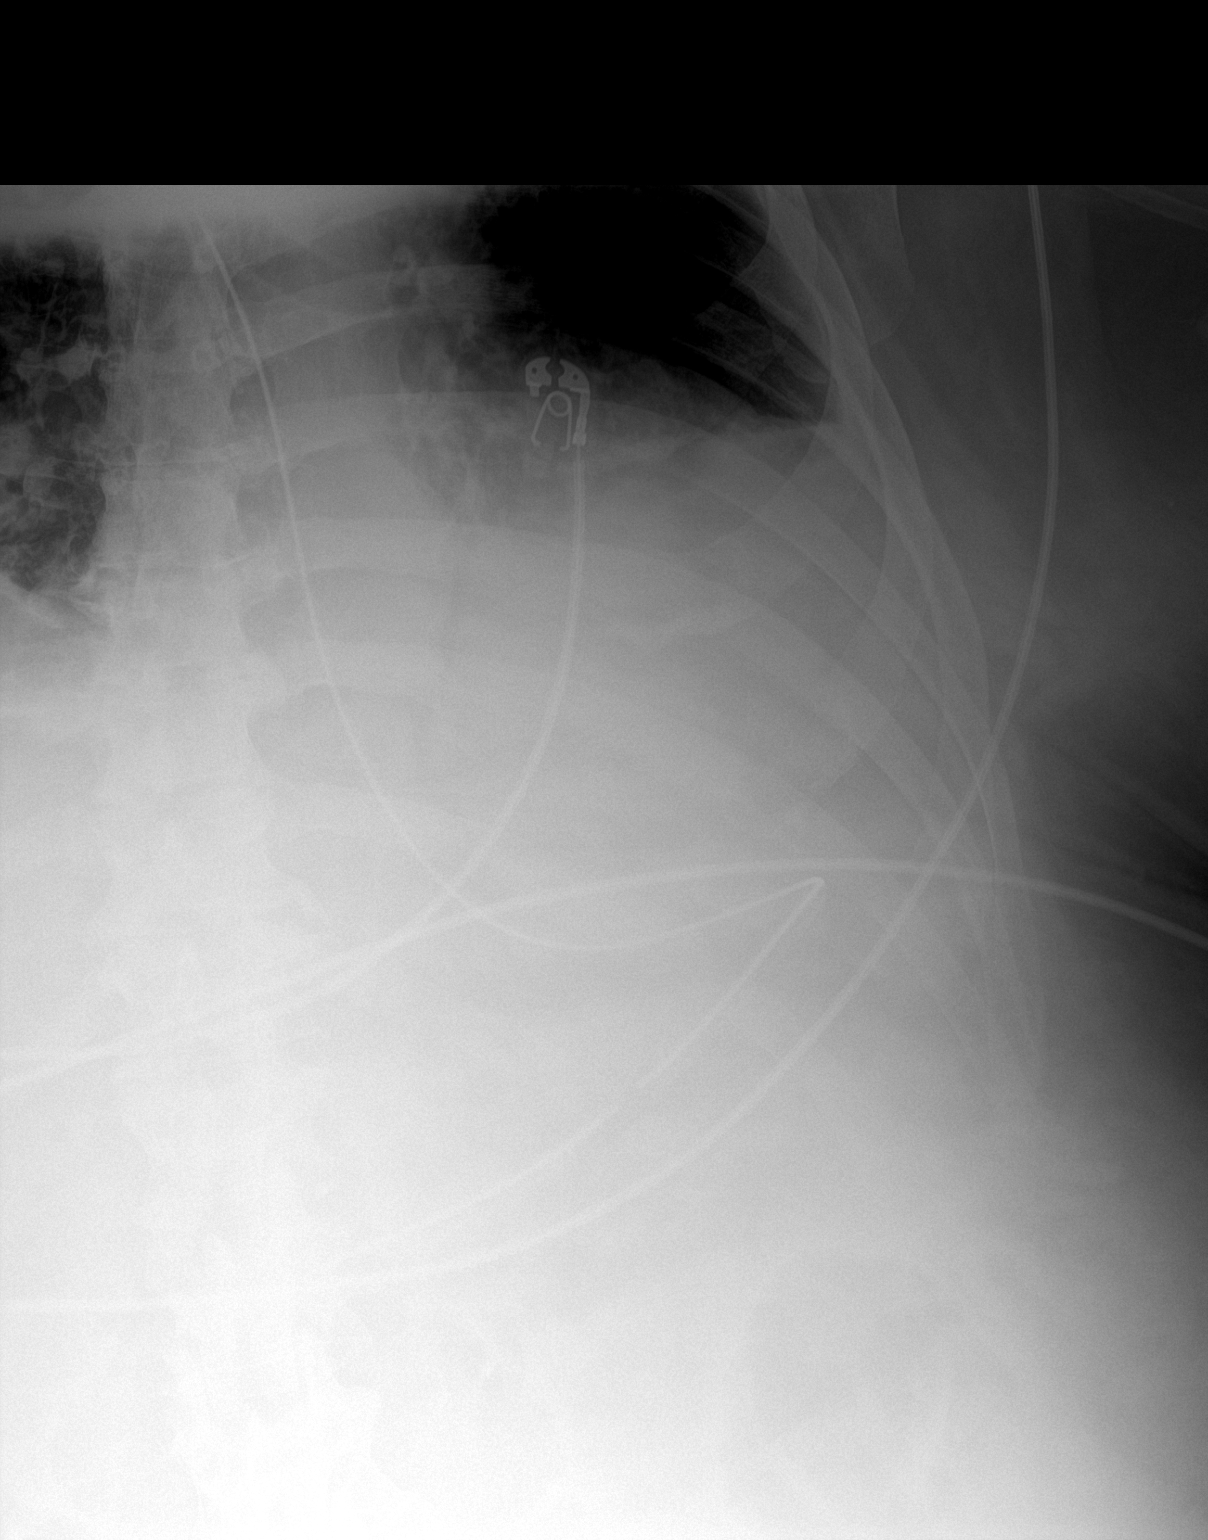

[1 of 1 positions shown; findings below may reference images not displayed]

FINDINGS: G-tube in good position.  The tip projects over the gastric antrum.
IMPRESSION: G-tube within the stomach, tip overlies the gastric antrum in good
position.

## 2021-12-20 MED ORDER — SODIUM CHLORIDE 0.9 % IV SOLN
2.0000 g | Freq: Two times a day (BID) | INTRAVENOUS | Status: DC
  Administered 2021-12-20: 2 g via INTRAVENOUS
  Filled 2021-12-20: qty 12.5

## 2021-12-20 MED ORDER — EPINEPHRINE 1 MG/10ML IJ SOSY: 1.0000 mg | PREFILLED_SYRINGE | Freq: Once | INTRAMUSCULAR | Status: AC

## 2021-12-20 MED ORDER — MIDAZOLAM HCL 2 MG/2ML IJ SOLN
2.0000 mg | Freq: Once | INTRAMUSCULAR | Status: AC
  Administered 2021-12-20: 2 mg via INTRAVENOUS
  Filled 2021-12-20: qty 2

## 2021-12-20 MED ORDER — HYDROMORPHONE HCL 1 MG/ML IJ SOLN
1.0000 mg | INTRAMUSCULAR | Status: DC | PRN
  Administered 2021-12-20: 3 mg via INTRAVENOUS
  Filled 2021-12-20: qty 3

## 2021-12-20 MED ORDER — PHENOBARBITAL SODIUM 130 MG/ML IJ SOLN
97.5000 mg | Freq: Three times a day (TID) | INTRAMUSCULAR | Status: DC
  Administered 2021-12-20: 97.5 mg via INTRAVENOUS
  Filled 2021-12-20: qty 1

## 2021-12-20 MED ORDER — SODIUM CHLORIDE 0.9 % IV SOLN: 2.0000 g | Freq: Three times a day (TID) | INTRAVENOUS | Status: DC

## 2021-12-20 MED ORDER — CLONAZEPAM 1 MG PO TABS
2.0000 mg | ORAL_TABLET | Freq: Two times a day (BID) | ORAL | Status: DC
  Administered 2021-12-20: 2 mg
  Filled 2021-12-20: qty 2

## 2021-12-20 MED ORDER — ALBUMIN HUMAN 25 % IV SOLN
12.5000 g | Freq: Once | INTRAVENOUS | Status: AC
  Administered 2021-12-20: 12.5 g via INTRAVENOUS
  Filled 2021-12-20: qty 50

## 2021-12-20 MED ORDER — MIDAZOLAM HCL 2 MG/2ML IJ SOLN
INTRAMUSCULAR | Status: AC
  Administered 2021-12-20: 2 mg via INTRAVENOUS
  Filled 2021-12-20: qty 2

## 2021-12-20 MED ORDER — PRISMASOL BGK 0/2.5 32-2.5 MEQ/L EC SOLN
Status: DC
  Filled 2021-12-20 (×2): qty 5000

## 2021-12-20 MED ORDER — SODIUM BICARBONATE 8.4 % IV SOLN
INTRAVENOUS | Status: AC
  Administered 2021-12-20: 100 meq via INTRAVENOUS
  Filled 2021-12-20: qty 150

## 2021-12-20 MED ORDER — VASOPRESSIN 20 UNITS/100 ML INFUSION FOR SHOCK
INTRAVENOUS | Status: AC
  Administered 2021-12-20: 0.03 [IU]/min via INTRAVENOUS
  Filled 2021-12-20: qty 100

## 2021-12-20 MED ORDER — ALTEPLASE (PULMONARY EMBOLISM) INFUSION
100.0000 mg | Freq: Once | INTRAVENOUS | Status: DC
  Filled 2021-12-20: qty 100

## 2021-12-20 MED ORDER — SODIUM BICARBONATE 8.4 % IV SOLN: 100.0000 meq | Freq: Once | INTRAVENOUS | Status: AC

## 2021-12-20 MED ORDER — HYDROMORPHONE HCL 1 MG/ML IJ SOLN: 1.0000 mg | INTRAMUSCULAR | Status: DC | PRN

## 2021-12-20 MED ORDER — DEXTROSE 5 % IV SOLN
INTRAVENOUS | Status: DC
  Filled 2021-12-20: qty 1000

## 2021-12-20 MED ORDER — SODIUM BICARBONATE 8.4 % IV SOLN
INTRAVENOUS | Status: AC
  Administered 2021-12-20: 100 meq via INTRAVENOUS
  Filled 2021-12-20: qty 50

## 2021-12-20 MED ORDER — HEPARIN SODIUM (PORCINE) 1000 UNIT/ML IJ SOLN
INTRAMUSCULAR | Status: AC
  Administered 2021-12-20: 2800 [IU]
  Filled 2021-12-20: qty 1

## 2021-12-20 MED ORDER — FLUDROCORTISONE ACETATE 0.1 MG PO TABS
0.1000 mg | ORAL_TABLET | Freq: Every day | ORAL | Status: DC
  Administered 2021-12-20: 0.1 mg
  Filled 2021-12-20: qty 1

## 2021-12-20 MED ORDER — CALCIUM GLUCONATE-NACL 1-0.675 GM/50ML-% IV SOLN
1.0000 g | INTRAVENOUS | Status: AC
  Administered 2021-12-20: 1000 mg via INTRAVENOUS
  Filled 2021-12-20: qty 50

## 2021-12-20 MED ORDER — PHENOBARBITAL SODIUM 65 MG/ML IJ SOLN: 65.0000 mg | Freq: Three times a day (TID) | INTRAMUSCULAR | Status: DC

## 2021-12-20 MED ORDER — PANTOPRAZOLE SODIUM 40 MG IV SOLR
40.0000 mg | Freq: Every day | INTRAVENOUS | Status: DC
  Administered 2021-12-20: 40 mg via INTRAVENOUS
  Filled 2021-12-20: qty 10

## 2021-12-20 MED ORDER — HEPARIN (PORCINE) 2000 UNITS/L FOR CRRT: INTRAVENOUS_CENTRAL | Status: DC | PRN

## 2021-12-20 MED ORDER — PHENYLEPHRINE HCL-NACL 20-0.9 MG/250ML-% IV SOLN: 0.0000 ug/min | INTRAVENOUS | Status: DC

## 2021-12-20 MED ORDER — TENECTEPLASE 50 MG IV KIT
50.0000 mg | PACK | INTRAVENOUS | Status: DC
  Filled 2021-12-20: qty 10

## 2021-12-20 MED ORDER — HEPARIN SODIUM (PORCINE) 1000 UNIT/ML DIALYSIS: 1000.0000 [IU] | INTRAMUSCULAR | Status: DC | PRN

## 2021-12-20 MED ORDER — PRISMASOL BGK 4/2.5 32-4-2.5 MEQ/L REPLACEMENT SOLN
Status: DC
  Filled 2021-12-20 (×2): qty 5000

## 2021-12-20 MED ORDER — PHENYLEPHRINE HCL-NACL 20-0.9 MG/250ML-% IV SOLN
INTRAVENOUS | Status: AC
  Administered 2021-12-20: 100 ug/min via INTRAVENOUS
  Filled 2021-12-20: qty 250

## 2021-12-20 MED ORDER — MIDAZOLAM BOLUS VIA INFUSION
0.0000 mg | INTRAVENOUS | Status: DC | PRN
  Administered 2021-12-20: 2 mg via INTRAVENOUS

## 2021-12-20 MED ORDER — SODIUM CHLORIDE 0.9 % IV BOLUS
500.0000 mL | Freq: Once | INTRAVENOUS | Status: AC
Start: 2021-12-20 — End: 2021-12-20
  Administered 2021-12-20: 500 mL via INTRAVENOUS

## 2021-12-20 MED ORDER — VANCOMYCIN VARIABLE DOSE PER UNSTABLE RENAL FUNCTION (PHARMACIST DOSING): Status: DC

## 2021-12-20 MED ORDER — POLYETHYLENE GLYCOL 3350 17 G PO PACK: 17.0000 g | PACK | Freq: Every day | ORAL | Status: DC

## 2021-12-20 MED ORDER — HEPARIN (PORCINE) 25000 UT/250ML-% IV SOLN
1600.0000 [IU]/h | INTRAVENOUS | Status: DC
  Administered 2021-12-20: 1600 [IU]/h via INTRAVENOUS
  Filled 2021-12-20: qty 250

## 2021-12-20 MED ORDER — MIDAZOLAM HCL 2 MG/2ML IJ SOLN: 2.0000 mg | Freq: Once | INTRAMUSCULAR | Status: AC

## 2021-12-20 MED ORDER — NOREPINEPHRINE 4 MG/250ML-% IV SOLN
INTRAVENOUS | Status: AC
  Filled 2021-12-20: qty 250

## 2021-12-20 MED ORDER — HYDROXYZINE HCL 10 MG/5ML PO SYRP: 25.0000 mg | ORAL_SOLUTION | ORAL | Status: DC | PRN

## 2021-12-20 MED ORDER — EPINEPHRINE 1 MG/10ML IJ SOSY: 1.0000 mg | PREFILLED_SYRINGE | Freq: Once | INTRAMUSCULAR | Status: DC

## 2021-12-20 MED ORDER — SODIUM CHLORIDE 0.9 % IV SOLN: 250.0000 mL | Freq: Once | INTRAVENOUS | Status: DC

## 2021-12-20 MED ORDER — LINEZOLID 600 MG/300ML IV SOLN: 600.0000 mg | Freq: Two times a day (BID) | INTRAVENOUS | Status: DC

## 2021-12-20 MED ORDER — INSULIN ASPART 100 UNIT/ML IJ SOLN: 2.0000 [IU] | INTRAMUSCULAR | Status: DC

## 2021-12-20 MED ORDER — SODIUM ZIRCONIUM CYCLOSILICATE 10 G PO PACK
10.0000 g | PACK | Freq: Once | ORAL | Status: AC
  Administered 2021-12-20: 10 g
  Filled 2021-12-20: qty 1

## 2021-12-20 MED ORDER — ALBUMIN HUMAN 25 % IV SOLN: 25.0000 g | Freq: Once | INTRAVENOUS | Status: AC

## 2021-12-20 MED ORDER — DOCUSATE SODIUM 50 MG/5ML PO LIQD: 100.0000 mg | Freq: Two times a day (BID) | ORAL | Status: DC

## 2021-12-20 MED ORDER — VASOPRESSIN 20 UNITS/100 ML INFUSION FOR SHOCK
0.0000 [IU]/min | INTRAVENOUS | Status: DC
  Administered 2021-12-20: 0.03 [IU]/min via INTRAVENOUS
  Filled 2021-12-20: qty 100

## 2021-12-20 MED ORDER — SODIUM CHLORIDE 0.9 % IV SOLN: 500.0000 [IU]/h | INTRAVENOUS | Status: DC

## 2021-12-20 MED ORDER — ALBUMIN HUMAN 25 % IV SOLN
INTRAVENOUS | Status: AC
  Administered 2021-12-20: 25 g via INTRAVENOUS
  Filled 2021-12-20: qty 100

## 2021-12-20 MED ORDER — PHENYLEPHRINE CONCENTRATED 100MG/250ML (0.4 MG/ML) INFUSION SIMPLE
0.0000 ug/min | INTRAVENOUS | Status: DC
  Administered 2021-12-20: 300 ug/min via INTRAVENOUS
  Administered 2021-12-20 (×2): 400 ug/min via INTRAVENOUS
  Filled 2021-12-20 (×4): qty 250

## 2021-12-20 MED ORDER — NOREPINEPHRINE 16 MG/250ML-% IV SOLN
0.0000 ug/min | INTRAVENOUS | Status: DC
  Administered 2021-12-20: 2 ug/min via INTRAVENOUS
  Filled 2021-12-20 (×2): qty 250

## 2021-12-20 MED ORDER — PRISMASOL BGK 0/2.5 32-2.5 MEQ/L EC SOLN
Status: DC
  Filled 2021-12-20 (×4): qty 5000

## 2021-12-20 MED ORDER — HYDROCORTISONE SOD SUC (PF) 100 MG IJ SOLR
100.0000 mg | Freq: Two times a day (BID) | INTRAMUSCULAR | Status: DC
  Administered 2021-12-20: 100 mg via INTRAVENOUS
  Filled 2021-12-20: qty 2

## 2021-12-20 MED ORDER — NOREPINEPHRINE 16 MG/250ML-% IV SOLN
0.0000 ug/min | INTRAVENOUS | Status: DC
  Administered 2021-12-20: 14 ug/min via INTRAVENOUS
  Filled 2021-12-20: qty 250

## 2021-12-20 MED ORDER — DEXMEDETOMIDINE HCL IN NACL 400 MCG/100ML IV SOLN
0.0000 ug/kg/h | INTRAVENOUS | Status: DC
  Administered 2021-12-20: 0.4 ug/kg/h via INTRAVENOUS
  Filled 2021-12-20: qty 100

## 2021-12-20 MED ORDER — PHENOBARBITAL SODIUM 65 MG/ML IJ SOLN: 32.5000 mg | Freq: Three times a day (TID) | INTRAMUSCULAR | Status: DC

## 2021-12-20 MED ORDER — HYDROXYZINE HCL 10 MG/5ML PO SYRP
25.0000 mg | ORAL_SOLUTION | Freq: Three times a day (TID) | ORAL | Status: DC
Start: 2021-12-20 — End: 2021-12-20
  Administered 2021-12-20: 25 mg
  Filled 2021-12-20 (×3): qty 12.5

## 2021-12-20 MED ORDER — AMIODARONE HCL IN DEXTROSE 360-4.14 MG/200ML-% IV SOLN
60.0000 mg/h | INTRAVENOUS | Status: AC
Start: 2021-12-20 — End: 2021-12-20
  Administered 2021-12-20 (×2): 60 mg/h via INTRAVENOUS
  Filled 2021-12-20: qty 200

## 2021-12-20 MED ORDER — AMIODARONE HCL IN DEXTROSE 360-4.14 MG/200ML-% IV SOLN
INTRAVENOUS | Status: AC
  Administered 2021-12-20: 150 mg via INTRAVENOUS
  Filled 2021-12-20: qty 200

## 2021-12-20 MED ORDER — MIDAZOLAM-SODIUM CHLORIDE 100-0.9 MG/100ML-% IV SOLN
0.0000 mg/h | INTRAVENOUS | Status: DC
  Administered 2021-12-20: 2 mg/h via INTRAVENOUS
  Filled 2021-12-20: qty 100

## 2021-12-20 MED ORDER — VANCOMYCIN HCL 2000 MG/400ML IV SOLN
2000.0000 mg | Freq: Once | INTRAVENOUS | Status: AC
  Administered 2021-12-20: 2000 mg via INTRAVENOUS
  Filled 2021-12-20: qty 400

## 2021-12-20 MED ORDER — HEPARIN SODIUM (PORCINE) 1000 UNIT/ML IJ SOLN
INTRAMUSCULAR | Status: AC
  Filled 2021-12-20: qty 1

## 2021-12-20 MED ORDER — AMIODARONE HCL IN DEXTROSE 360-4.14 MG/200ML-% IV SOLN: 30.0000 mg/h | INTRAVENOUS | Status: DC

## 2021-12-20 MED ORDER — VENLAFAXINE HCL 25 MG PO TABS
12.5000 mg | ORAL_TABLET | Freq: Two times a day (BID) | ORAL | Status: DC
  Administered 2021-12-20: 12.5 mg
  Filled 2021-12-20 (×2): qty 0.5

## 2021-12-20 MED ORDER — EPINEPHRINE HCL 5 MG/250ML IV SOLN IN NS
0.5000 ug/min | INTRAVENOUS | Status: DC
  Administered 2021-12-20: 1 ug/min via INTRAVENOUS
  Filled 2021-12-20: qty 250

## 2021-12-20 MED ORDER — MEROPENEM 1 G IV SOLR
1.0000 g | Freq: Three times a day (TID) | INTRAVENOUS | Status: DC
Start: 2021-12-20 — End: 2021-12-20

## 2021-12-20 MED ORDER — LACTATED RINGERS IV BOLUS
1000.0000 mL | Freq: Once | INTRAVENOUS | Status: AC
  Administered 2021-12-20: 1000 mL via INTRAVENOUS

## 2021-12-20 MED ORDER — AMIODARONE LOAD VIA INFUSION
150.0000 mg | Freq: Once | INTRAVENOUS | Status: AC
Start: 2021-12-20 — End: 2021-12-20

## 2021-12-20 MED ORDER — EPINEPHRINE 1 MG/10ML IJ SOSY
PREFILLED_SYRINGE | INTRAMUSCULAR | Status: AC
  Administered 2021-12-20: 1 mg via INTRAVENOUS
  Filled 2021-12-20: qty 30

## 2021-12-22 LAB — CULTURE, RESPIRATORY W GRAM STAIN
Culture: NO GROWTH
Gram Stain: NONE SEEN
Special Requests: NORMAL

## 2021-12-25 LAB — CULTURE, BLOOD (ROUTINE X 2)
Culture: NO GROWTH
Culture: NO GROWTH
Special Requests: ADEQUATE
Special Requests: ADEQUATE

## 2021-12-25 MED FILL — Medication: Qty: 1 | Status: AC

## 2022-01-03 NOTE — Progress Notes (Addendum)
eLink Physician-Brief Progress Note Patient Name: Adam Moreno DOB: 1956/04/05 MRN: [id number]   Date of Service  01-18-22  HPI/Events of Note  Multiple issues: 1. Fever to 103.0 F - Nursing reports increased pressor requirements. Already on Cefepime. 2. Oliguria - CVP reported as 31 earlier. Hgb = 11.8. Creatinine has increased from 1.21 --> 2.48. 3. Nursing reports BP reading not reliable by cuff.   eICU Interventions  Plan: Blood cultures X 2.  Tracheal aspirate gram stain and culture. UA with reflex microscopic.  Continue Cefepime pending culture results. 25% Albumin 12.5 gm IV now.  CMP at 5 AM. RT to place A-line.      Intervention Category Major Interventions: Other:  [name] January 18, 2022, 1:42 AM

## 2022-01-03 NOTE — IPAL (Signed)
  Interdisciplinary Goals of Care Family Meeting   Date carried out:: 12/25/21  Location of the meeting: Bedside  Member's involved: Nurse Practitioner, Bedside Registered Nurse, and Family Member or next of kin  Durable Power of Attorney or acting medical decision maker: wife: shannon     Discussion: We discussed goals of care for Kerby Moors .  Patient has had significant clinical decline over night. Looks to be multi-factorial: recurrent aspiration, worsening shock, now worsening renal failure and very tenuous blood pressure. Gatha Mayer has requested that should his heart stop that we not intervene w/ CPR.   Code status: Full DNR  Disposition: Continue current acute care   Time spent for the meeting: 21 minutes at bedside   Clementeen Graham 12/25/2021, 1:37 PM

## 2022-01-03 NOTE — Progress Notes (Signed)
Pharmacy Antibiotic Note  Adam Moreno is a 66 y.o. male with respiratory failure s/p trach, now with AKI and possible aspiration.    Pt has ben on abx since 5/15 for PNA/sepsis. He is not improving per team. Plan to expand cefepime to [place]. He is starting on CRRT today.   Plan: Dc cefepime Merrem 1g IV q8   Height: 6' (182.9 cm) Weight: (!) 154.2 kg (339 lb 15.2 oz) IBW/kg (Calculated) : 77.6  Temp (24hrs), Avg:100.8 F (38.2 C), Min:98.1 F (36.7 C), Max:103.1 F (39.5 C)  Recent Labs  Lab 12/14/21 0459 12/15/21 0438 12/17/21 0636 12/18/21 0834 12/19/21 0626 12/19/21 2236 01-13-2022 0321 01/13/2022 0451 01/13/22 0849 01-13-22 1139  WBC 14.0*  --  7.9  --  4.0 7.7  --   --   --   --   CREATININE 1.33*   < > 1.17   < > 1.58* 2.48* 3.18* 3.21*  --  4.11*  LATICACIDVEN  --   --   --   --   --   --   --  2.1* 2.4* 3.7*   < > = values in this interval not displayed.     Estimated Creatinine Clearance: 27.8 mL/min (A) (by C-G formula based on SCr of 4.11 mg/dL (H)).    No Known Allergies   Unasyn 5/12 >>5/15 Cefepime 5/15 >>5/18 Vanc 5/15 >>5/16 Linezolid 5/18>> Merrem 5/18>>   5/15 TA - NGTD 5/18 BCX: IP 5/18 Resp: IP  Onnie Boer, PharmD, BCIDP, AAHIVP, CPP Infectious Disease Pharmacist Jan 13, 2022 4:29 PM

## 2022-01-03 NOTE — Progress Notes (Signed)
Date and time results received: 12/27/21 1654   (use smartphrase ".now" to insert current time)  Test:K+  Critical Value: 6.9  Name of Provider Notified: Dr Carlis Abbott  Orders Received? Or Actions Taken?: Actively pushing EPI on patient. No further orders for this at this time.

## 2022-01-03 NOTE — Progress Notes (Signed)
BP and HR dropped to 30's. MD to bedside.  Epi gtt increased to 30 per MD 1645 1 amp epi 1651 1 amp bicarb 1652 50 mg TNK given with MD and pharmacist at bedside 1654 1 amp epi

## 2022-01-03 NOTE — Progress Notes (Signed)
75 mL versed wasted in sink with Normajean Baxter RN

## 2022-01-03 NOTE — Death Summary Note (Signed)
DEATH SUMMARY   Patient Details  Name: Adam Moreno MRN: [id number] DOB: [birth year]  Admission/Discharge Information   Admit Date:  12-31-21  Date of Death: Date of Death: 2022-01-09  Time of Death: Time of Death: 1699-11-06  Length of Stay: 01-Nov-2022  Referring Physician: Glenis Smoker, MD   Reason(s) for Hospitalization  Schwannoma of C-spine  Diagnoses  Preliminary cause of death:  Secondary Diagnoses (including complications and co-morbidities):  Principal Problem:   Schwannoma of spinal cord [facility]) Active Problems:   Mixed hyperlipidemia   Hypertension   Sleep apnea   Depression   Acute respiratory failure ([place])   Tracheostomy dependence ([place])   Aspiration pneumonia ([place])   Atelectasis   (HFpEF) heart failure with preserved ejection fraction (HCC)   Obesity, Class III, BMI 40-49.9 (morbid obesity) ([place])   Acute metabolic encephalopathy   PAF (paroxysmal atrial fibrillation) ([place])   Anemia   AKI (acute kidney injury) ([place])   Hypotension due to medication   Volume overload   High anion gap metabolic acidosis   Fever   Oliguria  Massive pulmonary embolism Lactic acidosis Anuria RLL aspiration pneumonia Acute respiratory failure with hypoxia Agitated delirium Shock due to massive PE Hyperkalemia Hypervolemia Hypocalcemia Afib with RVR Acute anemia due to critical illness Hyperglycemia Elevated transaminses   Brief Hospital Course (including significant findings, care, treatment, and services provided and events leading to death)  Adam Moreno is a 66 y.o. year old male who presented to [facility] 01-01-23 for planned laminectomy/surgical resection of C1-C2 nerve sheath tumor with NSGY (Dr. Zada Finders). 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   Patient presented for scheduled laminectomy/nerve sheath tumor resection 01-Jan-2023. Post-induction/intubation prior to the procedure,  patient became hypotensive and it was unclear if he had a pulse (true pulselessness versus instrumentation). Epi was administered x 1 and a short course of CPR was completed with return of palpable pulse. Case was aborted. Patient was successfully extubated in the OR with appropriate mental status. Placed on Neosynephrine for BP support. Patient was transferred to ICU for further workup. PCCM was consulted for help with evaluation and medical management.  Post-operatively he had a complicated course with refractory respiratory failure. He was extubated twice with rapid need for reintubation for hypoxia and respiratory arrest. He underwent tracheostomy on 5/14 and was started on antibiotics for aspiration pneumonia. Antibiotics were escalated on 5/15. During his decompensation requiring reintubation, he developed Afib with RVR that resolved with amiodarone, and amiodarone was stopped several days later.   He had a complicated course with agitated delirium requiring multiple sedatives. Agitation and need for ongoing sedation limited his ability to work with PT, OT, and SLP at times and slowed efforts at vent weaning. On 5/17 he was able to be weaned from 80% on the vent to 40%, tolerated PS trial and was able to tolerated several hours on trach collar trial. That night he developed severe agitation again and recurrent Afib with RVR, shock, and was heavily sedated. Amiodarone and heparin were started. He developed bradycardia on amiodarone and it was stopped. He was on pressors to maintain MAP>65 while awaiting him to wake up. Due to progressive renal failure over 2 days from 5/17-5/18 with development of hyperkalemia and ongoing hypervolemia, he was started on CRRT. That afternoon he developed sudden worsening shock despite sedation and amiodarone being held. Trop elevated at 8k. Echo demonstrated severely dilated RV, new since last week. TNK empirically prescribed  due to high concern for acute PE and he was too  unstable to travel for confirmatory CT scan. Unfortunately before TNK was given he developed severe bradycardia and despite multiple epinephrine and bicarb pushes, he did not recover. He was DNR. His wife was at bedside when he passed away.  Pertinent Labs and Studies  Significant Diagnostic Studies DG Cervical Spine 1 View  Result Date: 12/22/2021 CLINICAL DATA:  Posterior cervical laminectomy C1-2 for tumor resection. EXAM: DG CERVICAL SPINE - 1 VIEW COMPARISON:  MRI cervical spine 11/14/2021 FINDINGS: Images were performed intraoperatively without the presence of a radiologist. Single lateral view of the cervical spine. Surgical instrumentation from posterior approach points towards the C1 vertebral body level. Total fluoroscopy images: 1 Total fluoroscopy time: 2 seconds Total dose: Radiation Exposure Index (as provided by the fluoroscopic device): 0.72 mGy air Kerma Please see intraoperative findings for further detail. IMPRESSION: Intraoperative fluoroscopy of the cervical spine. Electronically Signed   By: Yvonne Kendall M.D.   On: 12/27/2021 18:26   DG Abd 1 View  Result Date: Jan 06, 2022 CLINICAL DATA:  Confirm G-tube placement EXAM: ABDOMEN - 1 VIEW COMPARISON:  Chest x-ray obtained earlier today FINDINGS: G-tube in good position.  The tip projects over the gastric antrum. IMPRESSION: G-tube within the stomach, tip overlies the gastric antrum in good position. Electronically Signed   By: Jacqulynn Cadet M.D.   On: 01-06-2022 10:05   MR CERVICAL SPINE W WO CONTRAST  Result Date: 12/15/2021 CLINICAL DATA:  Follow-up examination status post C1-2 nerve sheath tumor resection and laminectomy. EXAM: MRI CERVICAL SPINE WITHOUT AND WITH CONTRAST TECHNIQUE: Multiplanar and multiecho pulse sequences of the cervical spine, to include the craniocervical junction and cervicothoracic junction, were obtained without and with intravenous contrast. CONTRAST:  52m GADAVIST GADOBUTROL 1 MMOL/ML IV SOLN  COMPARISON:  Comparison made with prior MRI from 11/14/2021. FINDINGS: Alignment: Straightening of the normal cervical lordosis. No interval listhesis or malalignment. Vertebrae: Vertebral body height maintained without acute or interval fracture. Bone marrow signal intensity within normal limits. No discrete or worrisome osseous lesions. Postoperative changes from interval resection of previously seen nerve sheath tumor on the right at C1-2. Sequelae of right laminectomies at C1 and C2. Postoperative collection measuring approximately 4.3 x 5.2 x 5.6 cm seen at the resection bed, most consistent with a benign postoperative collection/seroma. Probable small amount of admixed internal blood with internal fluid-fluid level. Few scattered foci of postoperative gas are present as well. The previously seen tumor has been resected, with no visible residual tumor seen at this time. No adverse features are identified. Cord: Mild residual cord signal abnormality seen at the level of C1-2, improved from previous exam (series 7, image 9). Signal intensity within the cord is otherwise normal. No epidural hematoma or other collection. Posterior Fossa, vertebral arteries, paraspinal tissues: Visualized brain and posterior fossa within normal limits. Craniocervical junction otherwise normal. Postoperative changes within the upper posterior soft tissues as above. Preserved flow voids are seen within the vertebral arteries bilaterally. Disc levels: C1-2: Resolved mass effect on the upper cervical cord at this level. No significant residual spinal stenosis. C2-C3: Minimal disc bulge with right-sided uncovertebral spurring. No significant canal or foraminal stenosis. C3-C4: Shallow posterior disc osteophyte mildly indents the ventral thecal sac. No significant spinal stenosis. Foramina remain patent. C4-C5: Right paracentral disc protrusion indents the ventral thecal sac (series 8, image 24). Mild spinal stenosis without frank cord  impingement. Foramina remain patent. C5-C6: Left paracentral disc protrusion indents the ventral  thecal sac (series 8, image 30). Minimal flattening of the ventral cord without cord signal changes. Mild spinal stenosis. Right-sided uncovertebral spurring with mild right C6 foraminal narrowing. Left neural foramen remains patent. C6-C7: Degenerative intervertebral disc space narrowing. Broad posterior disc osteophyte indents the ventral thecal sac. Minimal cord flattening without cord signal changes. Mild spinal stenosis. Moderate left worse than right C7 foraminal stenosis. C7-T1: Negative interspace. Right worse than left facet arthrosis. Mild right C8 foraminal stenosis. Left neural foramina remains patent. Visualized upper thoracic spine demonstrates no significant finding. IMPRESSION: 1. Postoperative changes from interval resection of previously seen right-sided nerve sheath tumor at C1-2. No visible residual tumor or adverse features. Improved/resolved mass effect upon the adjacent upper cervical spinal cord. Trace residual cord signal abnormality at this level, improved from prior exam. 2. 4.3 x 5.2 x 5.6 cm postoperative collection at the resection bed, most consistent with a benign postoperative collection/seroma. 3. Underlying multilevel cervical spondylosis, otherwise stable. Electronically Signed   By: Jeannine Boga M.D.   On: 12/15/2021 04:26   US RENAL  Result Date: 01/16/22 CLINICAL DATA:  Acute kidney injury EXAM: RENAL / URINARY TRACT ULTRASOUND COMPLETE COMPARISON:  None Available. FINDINGS: Right Kidney: Renal measurements: 11.2 x 7.4 x 6.6 = volume: 290 mL. Increased cortical echogenicity. No mass or hydronephrosis visualized. Left Kidney: Renal measurements: 6.3 x 4.7 x 3.1 cm = volume: 49 mL. Increased cortical echogenicity. 8 cm anechoic renal cysts which appear benign requiring no follow-up. Bladder: Appears normal for degree of bladder distention. Other: Study is limited by  patient habitus and disposition (intubated and sedated) IMPRESSION: 1. No hydronephrosis. 2. Atrophic left kidney. 3. Increased cortical echogenicity bilaterally, may reflect artifact or suggest medical renal disease. Electronically Signed   By: Dahlia Bailiff M.D.   On: 01/16/2022 15:58   DG Chest Port 1 View  Result Date: 01/16/22 CLINICAL DATA:  Pneumonia EXAM: PORTABLE CHEST 1 VIEW COMPARISON:  12/19/2021 FINDINGS: Tracheostomy tube and right-sided PICC line remain in place. Stable cardiomegaly. Low lung volumes. Persistent small bilateral pleural effusions with associated bibasilar opacities. No pneumothorax. IMPRESSION: Persistent small bilateral pleural effusions with associated bibasilar opacities. Electronically Signed   By: Davina Poke D.O.   On: January 16, 2022 08:06   DG CHEST PORT 1 VIEW  Result Date: 12/19/2021 CLINICAL DATA:  Hypoxia, tracheostomy tube EXAM: PORTABLE CHEST 1 VIEW COMPARISON:  12/19/2021 at 9:40 a.m. FINDINGS: Single frontal view of the chest demonstrates interval removal of the enteric catheter. Tracheostomy tube and right-sided PICC unchanged. Cardiac silhouette is enlarged. Persistent bilateral veiling opacities, right greater than left. Decreased lung volumes since prior study. No pneumothorax. IMPRESSION: 1. Decreased lung volumes, with persistent bibasilar veiling opacities consistent with effusions and consolidation. Electronically Signed   By: Randa Ngo M.D.   On: 12/19/2021 21:19   DG CHEST PORT 1 VIEW  Result Date: 12/19/2021 CLINICAL DATA:  Hypoxia, trach present. EXAM: PORTABLE CHEST 1 VIEW COMPARISON:  Radiograph Dec 16, 2021 FINDINGS: Tracheostomy tube overlies the central air column. Enteric feeding catheter courses below the diaphragm with tip obscured by collimation. Right upper extremity PICC with tip overlying the right atrium. The heart size and mediastinal contours are unchanged. Reduced lung volumes bilaterally. Similar veiling opacities in the  bilateral lung bases consistent with pleural effusions and consolidations. No acute osseous abnormality. IMPRESSION: Similar veiling opacities in the bilateral lung bases consistent with pleural effusions and consolidations. Electronically Signed   By: Dahlia Bailiff M.D.   On: 12/19/2021 09:54  DG Chest Port 1 View  Result Date: 12/16/2021 CLINICAL DATA:  Tracheostomy placement EXAM: PORTABLE CHEST 1 VIEW COMPARISON:  12/15/2021 FINDINGS: Single frontal view of the chest demonstrates interval removal of the endotracheal and enteric catheter. Tracheostomy tube overlies tracheal air column tip at thoracic inlet. Right internal jugular catheter tip overlies superior vena cava. Cardiac silhouette is enlarged but stable. Lung volumes are diminished, with veiling opacities at the lung bases again noted, right greater than left. No pneumothorax. IMPRESSION: 1. No complication after tracheostomy tube placement. 2. Persistent bibasilar veiling opacities consistent with consolidation and effusions. Electronically Signed   By: Randa Ngo M.D.   On: 12/16/2021 16:44   DG CHEST PORT 1 VIEW  Result Date: 12/15/2021 CLINICAL DATA:  Follow-up respiratory status. EXAM: PORTABLE CHEST 1 VIEW COMPARISON:  Multiple previous chest x-rays. The most recent is 12/10/2021 FINDINGS: The endotracheal tube, NG tube and right IJ central venous catheters are stable. Low lung volumes with vascular crowding and atelectasis. Persistent bilateral pleural effusions. IMPRESSION: 1. Stable support apparatus. 2. Low lung volumes with vascular crowding and atelectasis. 3. Persistent bilateral pleural effusions. Electronically Signed   By: Marijo Sanes M.D.   On: 12/15/2021 13:05   DG CHEST PORT 1 VIEW  Result Date: 12/14/2021 CLINICAL DATA:  ETT placement EXAM: PORTABLE CHEST 1 VIEW COMPARISON:  Radiograph 12/12/2021 FINDINGS: Endotracheal tube overlies the midthoracic trachea, 3.4 cm above the carina. Right neck catheter tip overlies  the mid superior vena cava. Orogastric tube passes below the diaphragm, tip excluded by collimation. Unchanged cardiomediastinal silhouette. There are bibasilar airspace opacities, right greater than left. There is a small left pleural effusion. Low lung volumes. No visible pneumothorax. No acute osseous abnormality. IMPRESSION: Endotracheal tube tip overlies the midthoracic trachea. Right neck approach catheter tip overlies the mid SVC. Low lung volumes with bibasilar airspace disease, right greater than left, could be atelectasis or infection. Small left pleural effusion. Electronically Signed   By: Maurine Simmering M.D.   On: 12/14/2021 10:54   DG Chest Port 1 View  Result Date: 12/12/2021 CLINICAL DATA:  Short of breath EXAM: PORTABLE CHEST 1 VIEW COMPARISON:  None Available. FINDINGS: Normal cardiac silhouette. Central venous line with tip in mid SVC. LEFT basilar atelectasis. Platelike atelectasis in the RIGHT lung. Some improvement aeration to lung bases compared to prior. IMPRESSION: 1. Improved aeration lung bases compared to prior. 2. LEFT basilar atelectasis. Electronically Signed   By: Suzy Bouchard M.D.   On: 12/12/2021 08:07   DG CHEST PORT 1 VIEW  Result Date: 12/08/2021 CLINICAL DATA:  Provided history: Postop, shortness of breath. EXAM: PORTABLE CHEST 1 VIEW COMPARISON:  Prior chest radiographs 11/14/2021 and earlier. FINDINGS: Right IJ approach central venous catheter with tip projecting at the level of the mid-to-lower SVC. Shallow inspiration radiograph, limiting evaluation of heart size. Ill-defined opacities within the bilateral lung bases, likely reflecting atelectasis. No evidence of pleural effusion or pneumothorax. No acute bony abnormality identified. IMPRESSION: Right IJ approach central venous catheter with tip projecting at the level of the mid-to-lower SVC. Shallow inspiration radiograph. Ill-defined opacities at the bilateral lung bases, likely reflecting atelectasis.  Electronically Signed   By: Kellie Simmering D.O.   On: 12/10/2021 14:57   DG Abd Portable 1V  Result Date: 12/19/2021 CLINICAL DATA:  To check for the feeding tube placement EXAM: PORTABLE ABDOMEN - 1 VIEW COMPARISON:  Dec 17, 2021 FINDINGS: The tip of the feeding tube now seen at the distal gastric antrum in comparison to  pylorus in the previous study. There is moderate gaseous distention of the small bowel loops seen with dilated small bowel loop measuring 5.8 cm in greatest dimension at the left upper abdomen. IMPRESSION: Tip of the feeding tube is seen at the distal gastric antrum in comparison to pylorus in the previous study. Moderate gaseous distention of the small bowel loops and has increased in the interim. Electronically Signed   By: Frazier Richards M.D.   On: 12/19/2021 09:52   DG Abd Portable 1V  Result Date: 12/17/2021 CLINICAL DATA:  NG tube placement. EXAM: PORTABLE ABDOMEN - 1 VIEW COMPARISON:  None Available. FINDINGS: Small bore feeding tube terminates in the distal stomach. Bowel gas pattern is unremarkable. Heart is enlarged. Small effusions are present. IMPRESSION: Small bore feeding tube terminates in the distal stomach. Electronically Signed   By: San Morelle M.D.   On: 12/17/2021 12:17   DG Abd Portable 1V  Result Date: 12/15/2021 CLINICAL DATA:  NG tube position. EXAM: PORTABLE ABDOMEN - 1 VIEW COMPARISON:  12/14/2021 FINDINGS: Stable position of the NG tube. The tip is in the region of the duodenal bulb or descending duodenum. Diffuse air throughout the bowel most likely representing a postoperative ileus. IMPRESSION: NG tube tip is in the duodenal bulb or descending duodenum. Diffuse postoperative ileus. Electronically Signed   By: Marijo Sanes M.D.   On: 12/15/2021 13:07   DG Abd Portable 1V  Result Date: 12/14/2021 CLINICAL DATA:  OG tube placement EXAM: PORTABLE ABDOMEN - 1 VIEW COMPARISON:  None Available. FINDINGS: Orogastric tube side port overlies the distal  stomach, tip overlying the region of the gastric antrum/proximal duodenum. Defibrillator pads noted. Nonobstructive bowel gas pattern. Bibasilar airspace opacities, see separately dictated chest radiograph. IMPRESSION: Orogastric tube side port overlies the distal stomach, tip overlying the region of the gastric antrum/proximal duodenum. Electronically Signed   By: Maurine Simmering M.D.   On: 12/14/2021 11:00   DG C-Arm 1-60 Min-No Report  Result Date: 12/10/2021 Fluoroscopy was utilized by the requesting physician.  No radiographic interpretation.   ECHOCARDIOGRAM COMPLETE  Result Date: 12/12/2021    ECHOCARDIOGRAM REPORT   Patient Name:   Adam Moreno Moreno Date of Exam: 12/12/2021 Medical Rec #:  [id number]            Height:       72.0 in Accession #:    [id number]           Weight:       325.0 lb Date of Birth:  [birth year]             BSA:          2.619 m Patient Age:    52 years             BP:           154/74 mmHg Patient Gender: M                    HR:           73 bpm. Exam Location:  Inpatient Procedure: 2D Echo, Color Doppler and Cardiac Doppler Indications:    Other abnormalities of the heart  History:        Patient has prior history of Echocardiogram examinations, most                 recent 07/19/2021.  Sonographer:    Joette Catching RCS Referring Phys: [place]  1. Left ventricular ejection  fraction, by estimation, is 60 to 65%. The left ventricle has normal function. Left ventricular endocardial border not optimally defined to evaluate regional wall motion. Left ventricular diastolic parameters are consistent with Grade I diastolic dysfunction (impaired relaxation).  2. Right ventricular systolic function is normal. The right ventricular size is normal.  3. The mitral valve is abnormal. Trivial mitral valve regurgitation. No evidence of mitral stenosis.  4. The aortic valve is tricuspid. There is mild calcification of the aortic valve. There is mild thickening of  the aortic valve. Aortic valve regurgitation is not visualized. Aortic valve sclerosis/calcification is present, without any evidence of aortic stenosis. Comparison(s): Compared to prior limited echo report on 07/19/21, there is no significant change. FINDINGS  Left Ventricle: Left ventricular ejection fraction, by estimation, is 60 to 65%. The left ventricle has normal function. Left ventricular endocardial border not optimally defined to evaluate regional wall motion. The left ventricular internal cavity size was normal in size. There is no left ventricular hypertrophy. Left ventricular diastolic parameters are consistent with Grade I diastolic dysfunction (impaired relaxation). Normal left ventricular filling pressure. Right Ventricle: The right ventricular size is normal. Right vetricular wall thickness was not well visualized. Right ventricular systolic function is normal. Left Atrium: Left atrial size was normal in size. Right Atrium: Right atrial size was normal in size. Pericardium: There is no evidence of pericardial effusion. Mitral Valve: The mitral valve is abnormal. There is mild thickening of the mitral valve leaflet(s). There is mild calcification of the mitral valve leaflet(s). Trivial mitral valve regurgitation. No evidence of mitral valve stenosis. Tricuspid Valve: The tricuspid valve is normal in structure. Tricuspid valve regurgitation is trivial. Aortic Valve: The aortic valve is tricuspid. There is mild calcification of the aortic valve. There is mild thickening of the aortic valve. Aortic valve regurgitation is not visualized. Aortic valve sclerosis/calcification is present, without any evidence of aortic stenosis. Aortic valve mean gradient measures 5.0 mmHg. Aortic valve peak gradient measures 10.8 mmHg. Aortic valve area, by VTI measures 2.53 cm. Pulmonic Valve: The pulmonic valve was not well visualized. Pulmonic valve regurgitation is not visualized. Aorta: The aortic root and ascending  aorta are structurally normal, with no evidence of dilitation. IAS/Shunts: The atrial septum is grossly normal.  LEFT VENTRICLE PLAX 2D LVIDd:         4.10 cm   Diastology LVIDs:         3.40 cm   LV e' medial:    8.49 cm/s LV PW:         1.10 cm   LV E/e' medial:  6.5 LV IVS:        1.10 cm   LV e' lateral:   10.30 cm/s LVOT diam:     2.20 cm   LV E/e' lateral: 5.4 LV SV:         72 LV SV Index:   28 LVOT Area:     3.80 cm  RIGHT VENTRICLE             IVC RV Basal diam:  3.20 cm     IVC diam: 1.70 cm RV Mid diam:    2.50 cm RV S prime:     16.00 cm/s LEFT ATRIUM             Index        RIGHT ATRIUM           Index LA diam:        3.50 cm 1.34 cm/m  RA Area:     17.10 cm LA Vol (A2C):   55.4 ml 21.15 ml/m  RA Volume:   42.00 ml  16.04 ml/m LA Vol (A4C):   34.3 ml 13.10 ml/m LA Biplane Vol: 46.4 ml 17.72 ml/m  AORTIC VALVE                     PULMONIC VALVE AV Area (Vmax):    2.60 cm      PV Vmax:       0.99 m/s AV Area (Vmean):   2.56 cm      PV Peak grad:  3.9 mmHg AV Area (VTI):     2.53 cm AV Vmax:           164.00 cm/s AV Vmean:          110.000 cm/s AV VTI:            0.285 m AV Peak Grad:      10.8 mmHg AV Mean Grad:      5.0 mmHg LVOT Vmax:         112.00 cm/s LVOT Vmean:        74.100 cm/s LVOT VTI:          0.190 m LVOT/AV VTI ratio: 0.67  AORTA Ao Root diam: 3.40 cm Ao Asc diam:  3.50 cm MITRAL VALVE MV Area (PHT): 3.15 cm    SHUNTS MV Decel Time: 241 msec    Systemic VTI:  0.19 m MV E velocity: 55.30 cm/s  Systemic Diam: 2.20 cm MV A velocity: 76.30 cm/s MV E/A ratio:  0.72 Gwyndolyn Kaufman MD Electronically signed by Gwyndolyn Kaufman MD Signature Date/Time: 12/12/2021/12:56:29 PM    Final    ECHOCARDIOGRAM LIMITED  Result Date: 01-17-2022    ECHOCARDIOGRAM LIMITED REPORT   Patient Name:   Adam Moreno Date of Exam: Jan 17, 2022 Medical Rec #:  [id number]            Height:       72.0 in Accession #:    [id number]           Weight:       339.9 lb Date of Birth:  10/05/1955              BSA:          2.669 m Patient Age:    13 years             BP:           96/34 mmHg Patient Gender: M                    HR:           106 bpm. Exam Location:  Inpatient Procedure: 2D Echo and Limited Echo Indications:    Acute respiratory distress  History:        Patient has prior history of Echocardiogram examinations, most                 recent 12/12/2021. Arrythmias:Atrial Fibrillation; Risk                 Factors:Hypertension and Sleep Apnea.  Sonographer:    Jefferey Pica Referring Phys: [id number] Julian Hy  Sonographer Comments: Attempted to use contrast but was unable to due to no available IV access. IMPRESSIONS  1. Appears to be acute RV strain , dysfunction and enlargement since study done 12/12/21 consider r/o PE.  2. Left ventricular ejection fraction,  by estimation, is 60 to 65%. The left ventricle has normal function. The left ventricle has no regional wall motion abnormalities. There is mild left ventricular hypertrophy.  3. Right ventricular systolic function is severely reduced. The right ventricular size is severely enlarged.  4. No doppler/color flow calcified anterior leaflet tip with mobile density in LVOT . The mitral valve was not assessed. No evidence of mitral valve regurgitation. No evidence of mitral stenosis.  5. The aortic valve was not assessed. There is mild calcification of the aortic valve. Aortic valve regurgitation is not visualized. Aortic valve sclerosis is present, with no evidence of aortic valve stenosis.  6. The inferior vena cava is dilated in size with <50% respiratory variability, suggesting right atrial pressure of 15 mmHg. FINDINGS  Left Ventricle: Left ventricular ejection fraction, by estimation, is 60 to 65%. The left ventricle has normal function. The left ventricle has no regional wall motion abnormalities. The left ventricular internal cavity size was normal in size. There is  mild left ventricular hypertrophy. Right Ventricle: The right ventricular size is  severely enlarged. No increase in right ventricular wall thickness. Right ventricular systolic function is severely reduced. Left Atrium: Left atrial size was normal in size. Right Atrium: Right atrial size was normal in size. Pericardium: There is no evidence of pericardial effusion. Mitral Valve: No doppler/color flow calcified anterior leaflet tip with mobile density in LVOT. The mitral valve was not assessed. No evidence of mitral valve stenosis. Tricuspid Valve: The tricuspid valve is not assessed. Tricuspid valve regurgitation is not demonstrated. No evidence of tricuspid stenosis. Aortic Valve: Tri leaflet no color flow/doppler. The aortic valve was not assessed. There is mild calcification of the aortic valve. Aortic valve regurgitation is not visualized. Aortic valve sclerosis is present, with no evidence of aortic valve stenosis. Pulmonic Valve: The pulmonic valve was not assessed. Pulmonic valve regurgitation is not visualized. No evidence of pulmonic stenosis. Aorta: The aortic root is normal in size and structure. Venous: The inferior vena cava is dilated in size with less than 50% respiratory variability, suggesting right atrial pressure of 15 mmHg. IAS/Shunts: No atrial level shunt detected by color flow Doppler. Additional Comments: Appears to be acute RV strain , dysfunction and enlargement since study done 12/12/21 consider r/o PE. LEFT VENTRICLE PLAX 2D LVIDd:         3.70 cm LVIDs:         3.00 cm LV PW:         1.00 cm LV IVS:        1.40 cm LVOT diam:     2.00 cm LVOT Area:     3.14 cm  IVC IVC diam: 3.00 cm LEFT ATRIUM         Index LA diam:    3.90 cm 1.46 cm/m  PULMONIC VALVE PV Vmax:       0.76 m/s PV Peak grad:  2.3 mmHg   SHUNTS Systemic Diam: 2.00 cm Jenkins Rouge MD Electronically signed by Jenkins Rouge MD Signature Date/Time: Dec 31, 2021/4:28:11 PM    Final    [place] EKG SITE RITE  Result Date: 12/17/2021 If Site Rite image not attached, placement could not be confirmed due to current  cardiac rhythm.   Microbiology Recent Results (from the past 240 hour(s))  Surgical PCR screen     Status: None   Collection Time: 12/12/2021  8:12 AM   Specimen: Nasal Mucosa; Nasal Swab  Result Value Ref Range Status   MRSA, PCR NEGATIVE NEGATIVE Final  Staphylococcus aureus NEGATIVE NEGATIVE Final    Comment: (NOTE) The Xpert SA Assay (FDA approved for NASAL specimens in patients 51 years of age and older), is one component of a comprehensive surveillance program. It is not intended to diagnose infection nor to guide or monitor treatment. Performed at [facility], [place] [address], [place], [place] [address]   Culture, Respiratory w Gram Stain     Status: None   Collection Time: 12/17/21  7:48 AM   Specimen: Tracheal Aspirate; Respiratory  Result Value Ref Range Status   Specimen Description TRACHEAL ASPIRATE  Final   Special Requests NONE  Final   Gram Stain   Final    MODERATE WBC PRESENT,BOTH PMN AND MONONUCLEAR NO ORGANISMS SEEN    Culture   Final    NO GROWTH 2 DAYS Performed at [facility], [address], [place], [place] [address]    Report Status 12/19/2021 FINAL  Final  MRSA Next Gen by PCR, Nasal     Status: None   Collection Time: 12/17/21  7:58 AM   Specimen: Nasal Mucosa; Nasal Swab  Result Value Ref Range Status   MRSA by PCR Next Gen NOT DETECTED NOT DETECTED Final    Comment: (NOTE) The GeneXpert MRSA Assay (FDA approved for NASAL specimens only), is one component of a comprehensive MRSA colonization surveillance program. It is not intended to diagnose MRSA infection nor to guide or monitor treatment for MRSA infections. Test performance is not FDA approved in patients less than 55 years old. Performed at [facility], [place] [address], [place], [place] [address]   Culture, Respiratory w Gram Stain     Status: None (Preliminary result)   Collection Time: 01-10-22  2:19 AM   Specimen: Tracheal Aspirate; Respiratory  Result Value Ref  Range Status   Specimen Description TRACHEAL ASPIRATE  Final   Special Requests Normal  Final   Gram Stain   Final    NO SQUAMOUS EPITHELIAL CELLS SEEN MODERATE WBC SEEN NO ORGANISMS SEEN Performed at [facility], [address], [place], [place] [address]    Culture PENDING  Incomplete   Report Status PENDING  Incomplete    Lab Basic Metabolic Panel: Recent Labs  Lab 12/15/21 1127 12/17/21 0636 12/18/21 7619 12/18/21 5093 12/19/21 0626 12/19/21 2236 10-Jan-2022 0321 2022/01/10 0451 January 10, 2022 1135 2022/01/10 1139 10-Jan-2022 1531 2022/01/10 1532  NA  --  139  --    < > 143 143 144 139 143 144 144 143  K  --  3.0*  --    < > 4.1 4.3 4.3 4.2 6.0* 6.6* 6.9* 6.2*  CL  --  105  --    < > 109 111 113* 100  --  110 112*  --   CO2  --  28  --    < > 26 24 21* 19*  --  19* 16*  --   GLUCOSE  --  123*  --    < > 140* 162* 140* 593*  --  141* 112*  --   BUN  --  16  --    < > 27* 34* 36* 35*  --  43* 43*  --   CREATININE  --  1.17  --    < > 1.58* 2.48* 3.18* 3.21*  --  4.11* 4.52*  --   CALCIUM  --  7.9*  --    < > 8.2* 8.8* 8.1* 7.5*  --  8.0*  7.6*  --   MG 1.9 1.8 2.0  --  2.1  --  1.9  --   --   --   --   --   PHOS 4.1 2.8 3.7  --  4.0  --  3.2  --   --   --  7.8*  --    < > = values in this interval not displayed.   Liver Function Tests: Recent Labs  Lab 01/13/22 0321 January 13, 2022 0451 01/13/2022 1531  AST 253* 270*  --   ALT 183* 184*  --   ALKPHOS 63 54  --   BILITOT 1.0 1.2  --   PROT 6.2* 5.5*  --   ALBUMIN 2.8* 2.5* 2.6*   No results for input(s): LIPASE, AMYLASE in the last 168 hours. No results for input(s): AMMONIA in the last 168 hours. CBC: Recent Labs  Lab 12/14/21 0459 12/17/21 0636 12/19/21 0626 12/19/21 2236 01/13/22 1135 01/13/22 1532 01-13-2022 1633  WBC 14.0* 7.9 4.0 7.7  --   --  12.1*  NEUTROABS  --  5.7  --   --   --   --  9.9*  HGB 13.4 11.2* 11.2* 11.8* 11.9* 10.9* 11.0*  HCT 39.2 33.9* 34.8* 37.3* 35.0* 32.0* 34.4*  MCV 86.3 88.5 89.9 92.1  --    --  93.5  PLT 250 183 195 250  --   --  198  198   Cardiac Enzymes: No results for input(s): CKTOTAL, CKMB, CKMBINDEX, TROPONINI in the last 168 hours. Sepsis Labs: Recent Labs  Lab 12/17/21 0636 12/19/21 0626 12/19/21 2236 13-Jan-2022 0451 13-Jan-2022 0849 2022-01-13 1139 January 13, 2022 1633  PROCALCITON  --   --   --  4.48  --   --   --   WBC 7.9 4.0 7.7  --   --   --  12.1*  LATICACIDVEN  --   --   --  2.1* 2.4* 3.7* 8.9*    Procedures/Operations  Dialysis catheter placement Aline placement Percutaneous tracheostomy C1/C2 laminectomies and resection of tumor on 5/11 bronchoscopy  Julian Hy 12/21/2021, 2:56 PM

## 2022-01-03 NOTE — Procedures (Signed)
Arterial Catheter Insertion Procedure Note  Adam Moreno  [id number]  Oct 23, 1955  Date:01-05-2022  Time:11:15 AM    Provider Performing: Clementeen Graham    Procedure: Insertion of Arterial Line [phone]) with US guidance ([id number])   Indication(s) Blood pressure monitoring and/or need for frequent ABGs  Consent Risks of the procedure as well as the alternatives and risks of each were explained to the patient and/or caregiver.  Consent for the procedure was obtained and is signed in the bedside chart  Anesthesia None   Time Out Verified patient identification, verified procedure, site/side was marked, verified correct patient position, special equipment/implants available, medications/allergies/relevant history reviewed, required imaging and test results available.   Sterile Technique Maximal sterile technique including full sterile barrier drape, hand hygiene, sterile gown, sterile gloves, mask, hair covering, sterile ultrasound probe cover (if used).   Procedure Description Area of catheter insertion was cleaned with chlorhexidine and draped in sterile fashion. With real-time ultrasound guidance an arterial catheter was placed into the right femoral artery.  Appropriate arterial tracings confirmed on monitor.     Complications/Tolerance None; patient tolerated the procedure well.   EBL Minimal   Specimen(s) None Adam Moreno ACNP-BC [facility] Pager # [phone] OR # [phone] if no answer

## 2022-01-03 NOTE — Progress Notes (Signed)
eLink Physician-Brief Progress Note Patient Name: Adam Moreno DOB: [birth year] MRN: [id number]   Date of Service  01/05/22  HPI/Events of Note  Multiple issues: 1. Procalcitonin = 4.48. Patient is already on Vancomycin and Cefepime. 2. Lactic Acid = 2.1.  eICU Interventions  Plan: Bolus with 0.9 NaCl 500 mL IV over 1 hour now. Continue to trend Lactic Acid.      Intervention Category Major Interventions: Other:  Lysle Dingwall 2022-01-05, 7:04 AM

## 2022-01-03 NOTE — Progress Notes (Signed)
SLP Cancellation Note  Patient Details Name: Adam Moreno MRN: [id number] DOB: 1955-09-27   Cancelled treatment:       Reason Eval/Treat Not Completed: Patient not medically ready (Pt's case discussed with Ander Purpura, RN who indicated that the pt had a decline overnight and that it is therefore unlikely and he will be back on ATC today. SLP will follow up on a subsequent date.)  Tita Terhaar I. Hardin Negus, [place], [place] Office number [phone] Pager [place] 12/25/21, 9:56 AM

## 2022-01-03 NOTE — Progress Notes (Signed)
Arterial line attempted by 2 RTs x2  without success. RN aware and will notify MD.

## 2022-01-03 NOTE — Progress Notes (Signed)
Neurosurgery Service Progress Note  Subjective: Multiple issues overnight, pulled out core-trak, requiring increasing vent support and pressor support, Cr still uptrending, nephrology being consulted, unfortunately overall continued decline  Objective: Vitals:   2021-12-27 1200 Dec 27, 2021 1215 Dec 27, 2021 1230 Dec 27, 2021 1245  BP: 90/64 (!) 87/62    Pulse: 85 90 93 96  Resp: (!) 26 (!) 26 (!) 26 (!) 28  Temp: 99.8 F (37.7 C)     TempSrc:      SpO2: 91% 90% 91% 90%  Weight:      Height:        Physical Exam: Trach'd on vent, sedated, PERRL 28m with conjugate gaze, extremity / painful stimulation not performed given vent and sedation issues  Assessment & Plan: 66y.o. 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 5/11 s/p uneventful C1/C2 laminectomies and resection of tumor, prelim path schwannoma, 5/12 Abx for PNA 5/12 & 5/13 extubated and reintubated x2, 5/14 s/p trach, MRI w/ GTR, improved cord mass effect & dec'd signal change  -CCM recs -discussed with the patient's wife at bedside, she's up to date, CCM is doing a great job, currently on amnio / norepi / phenyl / vaso with inc'ing creatinine and some lactic acidosis, he really needs to catch a break  TJudith Part 0May 25, 20231:36 PM

## 2022-01-03 NOTE — Progress Notes (Signed)
Pharmacy Antibiotic Note  Adam Moreno is a 66 y.o. male with respiratory failure s/p trach, now with AKI and possible aspiration.  Pharmacy has been consulted for Vancomycin dosing.  Plan: Vancomycin 2000 mg IV now F/U renal function and redose as indicated Change Cefepime 2 g IV q12h   Height: 6' (182.9 cm) Weight: (!) 154.2 kg (339 lb 15.2 oz) IBW/kg (Calculated) : 77.6  Temp (24hrs), Avg:100.9 F (38.3 C), Min:98.1 F (36.7 C), Max:103.1 F (39.5 C)  Recent Labs  Lab 12/14/21 0459 12/15/21 0438 12/17/21 0636 12/18/21 0834 12/19/21 0626 12/19/21 2236 08-Jan-2022 0321  WBC 14.0*  --  7.9  --  4.0 7.7  --   CREATININE 1.33*   < > 1.17 1.21 1.58* 2.48* 3.18*   < > = values in this interval not displayed.    Estimated Creatinine Clearance: 35.9 mL/min (A) (by C-G formula based on SCr of 3.18 mg/dL (H)).    No Known Allergies   [name] 01/08/22 5:43 AM

## 2022-01-03 NOTE — Consult Note (Signed)
Reason for Consult: ARF Referring Physician: Carlis Abbott, DO  Adam Moreno is an 66 y.o. 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  The surgery was cancelled and he was extubated in the PACU and transferred to the ICU for close monitoring and continued pressor use.  His hospitalization has been complicated by refractory respiratory failure requiring tracheostomy.  He also developed atrial fib with RVR, hypotension, and also developed a fever, possibly due to aspiration pneumonia.  He has required pressor support and also developed AKI with decreasing UOP and hyperkalemia.  We were consulted for possible initiation of CVVHD.  The trend in SCr is seen below.  Trend in Creatinine: Creatinine, Ser  Date/Time Value Ref Range Status  07-Jan-2022 11:39 AM 4.11 (H) 0.61 - 1.24 mg/dL Final  01-07-22 04:51 AM 3.21 (H) 0.61 - 1.24 mg/dL Final  01/07/22 03:21 AM 3.18 (H) 0.61 - 1.24 mg/dL Final  12/19/2021 10:36 PM 2.48 (H) 0.61 - 1.24 mg/dL Final  12/19/2021 06:26 AM 1.58 (H) 0.61 - 1.24 mg/dL Final  12/18/2021 08:34 AM 1.21 0.61 - 1.24 mg/dL Final  12/17/2021 06:36 AM 1.17 0.61 - 1.24 mg/dL Final  12/15/2021 04:38 AM 1.36 (H) 0.61 - 1.24 mg/dL Final  12/14/2021 04:59 AM 1.33 (H) 0.61 - 1.24 mg/dL Final  12/12/2021 05:03 AM 1.12 0.61 - 1.24 mg/dL Final  12/16/2021 02:59 PM 1.54 (H) 0.61 - 1.24 mg/dL Final  12/05/2021 10:35 AM 1.35 (H) 0.61 - 1.24 mg/dL Final  12/07/2021 09:30 AM 1.90 (H) 0.61 - 1.24 mg/dL Final    PMH:   Past Medical History:  Diagnosis Date   AKI (acute kidney injury) ([place])    Depression    Hypertension    Sleep apnea    doesn't wear CPAP    PSH:   Past Surgical History:  Procedure Laterality Date   BIOPSY  11/07/2020   Procedure: BIOPSY;  Surgeon: Wilford Corner, MD;   Location: [facility];  Service: Endoscopy;;   COLONOSCOPY WITH PROPOFOL N/A 11/07/2020   Procedure: COLONOSCOPY WITH PROPOFOL;  Surgeon: Wilford Corner, MD;  Location: [facility];  Service: Endoscopy;  Laterality: N/A;   POLYPECTOMY  11/07/2020   Procedure: POLYPECTOMY;  Surgeon: Wilford Corner, MD;  Location: [facility];  Service: Endoscopy;;   POSTERIOR CERVICAL FUSION/FORAMINOTOMY N/A 12/27/2021   Procedure: CANCELED PROCEDURE;  Surgeon: Judith Part, MD;  Location: [facility];  Service: Neurosurgery;  Laterality: N/A;  3C/Rm 19   POSTERIOR CERVICAL LAMINECTOMY N/A 12/30/2021   Procedure: POSTERIOR CERVICAL LAMINECTOMY CERVICAL ONE, CERVICAL TWO For Tumor Resection;  Surgeon: Judith Part, MD;  Location: [facility];  Service: Neurosurgery;  Laterality: N/A;    Allergies: No Known Allergies  Medications:   Prior to Admission medications   Medication Sig Start Date End Date Taking? Authorizing Provider  amLODipine (NORVASC) 10 MG tablet Take 10 mg by mouth daily. 09/12/20  Yes [provider]  DULoxetine (CYMBALTA) 60 MG capsule Take 60 mg by mouth daily.   Yes [provider]  furosemide (LASIX) 20 MG tablet TAKE 1 TABLET BY MOUTH EVERY DAY 09/14/21  Yes Patwardhan, Manish J, MD  metoprolol succinate (TOPROL-XL) 25 MG 24 hr tablet Take 25 mg by mouth daily. 09/11/20  Yes [provider]  rosuvastatin (CRESTOR) 10 MG tablet Take 1 tablet (10 mg total) by mouth daily.  08/08/21 12/08/2021 Yes Patwardhan, Manish J, MD  valsartan-hydrochlorothiazide (DIOVAN-HCT) 320-25 MG tablet Take 1 tablet by mouth daily. 08/04/20  Yes [provider]    Inpatient medications:  bethanechol  10 mg Per Tube TID   chlorhexidine gluconate (MEDLINE KIT)  15 mL Mouth Rinse BID   Chlorhexidine Gluconate Cloth  6 each Topical Daily   clonazePAM  2 mg Per Tube BID   docusate  100 mg Per Tube BID   feeding supplement (PROSource TF)  45 mL Per Tube TID   fludrocortisone  0.1 mg  Per Tube Daily   hydrocortisone sod succinate (SOLU-CORTEF) inj  100 mg Intravenous Q12H   hydrOXYzine  25 mg Per Tube TID   insulin aspart  2-6 Units Subcutaneous Q4H   ipratropium-albuterol  3 mL Nebulization Q6H   mouth rinse  15 mL Mouth Rinse 10 times per day   pantoprazole (PROTONIX) IV  40 mg Intravenous Daily   PHENObarbital  97.5 mg Intravenous Q8H   Followed by   Derrill Memo ON 12/22/2021] PHENObarbital  65 mg Intravenous Q8H   Followed by   Derrill Memo ON 12/24/2021] PHENObarbital  32.5 mg Intravenous Q8H   polyethylene glycol  17 g Per Tube Daily   QUEtiapine  200 mg Per Tube BID   senna  1 tablet Per Tube BID   sodium chloride flush  3 mL Intravenous Q12H   venlafaxine  12.5 mg Per Tube BID    Discontinued Meds:   Medications Discontinued During This Encounter  Medication Reason   Cholecalciferol (DIALYVITE VITAMIN D 5000) 125 MCG (5000 UT) capsule Patient Preference   Chlorhexidine Gluconate Cloth 2 % PADS 6 each Patient Transfer   Chlorhexidine Gluconate Cloth 2 % PADS 6 each Patient Transfer   lactated ringers infusion Patient Transfer   fentaNYL (SUBLIMAZE) injection 25-50 mcg Patient Transfer   potassium chloride SA (KLOR-CON M) CR tablet 20 mEq    phenylephrine (NEO-SYNEPHRINE) 23m/NS 251mpremix infusion    lidocaine-EPINEPHrine (XYLOCAINE W/EPI) 1 %-1:100000 (with pres) injection Patient Discharge   Surgifoam 1 Gm with Thrombin 5,000 units (5 ml) topical solution Patient Discharge   0.9 % irrigation (POUR BTL) Patient Discharge   docusate sodium (COLACE) capsule 10762g Duplicate   lactated ringers infusion    propofol (DIPRIVAN) 1000 MG/100ML infusion    fentaNYL 250061min NS 250m29m0mc9m) infusion-PREMIX    chlorhexidine gluconate (MEDLINE KIT) (PERIDEX) 0.12 % solution 15 mL    MEDLINE mouth rinse    midazolam (VERSED) 2 MG/2ML injection Returned to ADS   oxyCODONE (Oxy IR/ROXICODONE) immediate release tablet 5 mg    oxyCODONE (Oxy IR/ROXICODONE) immediate  release tablet 10 mg    cyclobenzaprine (FLEXERIL) tablet 10 mg    polyethylene glycol (MIRALAX / GLYCOLAX) packet 17 g    HYDROmorphone (DILAUDID) injection 1 mg    fentaNYL (SUBLIMAZE) bolus via infusion 50-100 mcg    lactated ringers infusion    fentaNYL (SUBLIMAZE) injection 50 mcg One time medication   Ampicillin-Sulbactam (UNASYN) 3 g in sodium chloride 0.9 % 100 mL IVPB Change in therapy   amiodarone (NEXTERONE PREMIX) 360-4.14 MG/200ML-% (1.8 mg/mL) IV infusion    fentaNYL (SUBLIMAZE) injection 50 mcg    fentaNYL 2500mcg57mNS 250mL (22mg/m25mnfusion-PREMIX    fentaNYL (SUBLIMAZE) bolus via infusion 25-100 mcg    docusate (COLACE) 50 MG/5ML liquid 100 mg D831icate   vancomycin (VANCOCIN) IVPB 1000 mg/200 mL premix    0.9 %  sodium chloride infusion    HYDROmorphone (DILAUDID)  injection 1 mg    QUEtiapine (SEROQUEL) tablet 50 mg    norepinephrine (LEVOPHED) 16 mg in 212m premix infusion    propofol (DIPRIVAN) 1000 MG/100ML infusion    menthol-cetylpyridinium (CEPACOL) lozenge 3 mg    phenol (CHLORASEPTIC) mouth spray 1 spray    docusate (COLACE) 50 MG/5ML liquid 100 mg    heparin injection 5,000 Units    dexmedetomidine (PRECEDEX) 400 MCG/100ML (4 mcg/mL) infusion    acetaminophen (TYLENOL) tablet 650 mg    acetaminophen (TYLENOL) suppository 650 mg    enoxaparin (LOVENOX) injection 40 mg    cyclobenzaprine (FLEXERIL) tablet 10 mg    HYDROmorphone (DILAUDID) 50 mg in sodium chloride 0.9 % 100 mL (0.5 mg/mL) infusion    HYDROmorphone (DILAUDID) bolus via infusion 0.25-2 mg    midazolam (VERSED) injection 2 mg    QUEtiapine (SEROQUEL) tablet 100 mg    furosemide (LASIX) injection 60 mg    furosemide (LASIX) 100 mg in dextrose 5 % 50 mL IVPB    senna (SENOKOT) tablet 8.6 mg    clonazePAM (KLONOPIN) tablet 2 mg    dextrose 10 % infusion    dexmedetomidine (PRECEDEX) 400 MCG/100ML (4 mcg/mL) infusion    polyethylene glycol (MIRALAX / GLYCOLAX) packet 17 g Duplicate    norepinephrine (LEVOPHED) 462min 25069m0.016 mg/mL) premix infusion    midazolam (VERSED) injection 1 mg    furosemide (LASIX) 100 mg in dextrose 5 % 50 mL IVPB    enoxaparin (LOVENOX) injection 70 mg    melatonin tablet 3 mg    norepinephrine (LEVOPHED) 16 mg in 250m51memix infusion    ceFEPIme (MAXIPIME) 2 g in sodium chloride 0.9 % 100 mL IVPB    phenylephrine (NEO-SYNEPHRINE) 20mg57m250mL 57mix infusion    clonazePAM (KLONOPIN) tablet 1 mg    fentaNYL 2500mcg 4mS 250mL (135m/ml30mfusion-PREMIX    fentaNYL (SUBLIMAZE) bolus via infusion 50-100 mcg    midazolam (VERSED) 100 mg/100 mL (1 mg/mL) premix infusion    midazolam (VERSED) bolus via infusion 0-5 mg    docusate (COLACE) 50 MG/5ML liquid 100 mg    hydrOXYzine (ATARAX) 10 MG/5ML syrup 25 mg    sodium chloride flush (NS) 0.9 % injection 10-40 mL 81-85cate   sodium chloride flush (NS) 0.9 % injection 10-40 mL 63-14cate   polyethylene glycol (MIRALAX / GLYCOLAX) packet 17 g Duplicate   vancomycin variable dose per unstable renal function (pharmacist dosing)    pantoprazole sodium (PROTONIX) 40 mg/20 mL oral suspension 40 mg    heparin 10,000 units/ 20 mL infusion syringe    ceFEPIme (MAXIPIME) 2 g in sodium chloride 0.9 % 100 mL IVPB     Social History:  reports that he has never smoked. He has never used smokeless tobacco. He reports current alcohol use. He reports that he does not currently use drugs after having used the following drugs: Marijuana, Cocaine, Methamphetamines, and LSD.  Family History:   Family History  Problem Relation Age of Onset   Heart attack Father 60       32ly 1   Hypertension Brother     Review of systems not obtained due to patient factors. Weight change:   Intake/Output Summary (Last 24 hours) at Oct 11, 202306/06/2023t data filed at Oct 11, 202306/06/23ss per 24 hour  Intake 3866.87 ml  Output 1995 ml  Net 1871.87 ml   BP (!) 77/67   Pulse (!) 103   Temp 99.8 F (37.7 C)   Resp (!)  32  Ht 6' (1.829 m)   Wt (!) 154.2 kg   SpO2 90%   BMI 46.11 kg/m  Vitals:   2022-01-13 1215 January 13, 2022 1230 13-Jan-2022 1245 2022/01/13 1400  BP: (!) 87/62   (!) 77/67  Pulse: 90 93 96 (!) 103  Resp: (!) 26 (!) 26 (!) 28 (!) 32  Temp:      TempSrc:      SpO2: 90% 91% 90% 90%  Weight:      Height:         General appearance: sedated and on vent via trach Head: Normocephalic, without obvious abnormality, atraumatic Resp: clear to auscultation bilaterally Cardio: tachycardic, faint HS GI: obese, soft, NT  Extremities: edema 1+ anasarca  Labs: Basic Metabolic Panel: Recent Labs  Lab 12/14/21 1712 12/15/21 0438 12/15/21 1127 12/17/21 0636 12/18/21 0786 12/18/21 0834 12/19/21 0626 12/19/21 2236 01/13/22 0321 2022/01/13 0451 13-Jan-2022 1135 Jan 13, 2022 1139  NA  --  141  --  139  --  143 143 143 144 139 143 144  K  --  3.7  --  3.0*  --  3.1* 4.1 4.3 4.3 4.2 6.0* 6.6*  CL  --  105  --  105  --  111 109 111 113* 100  --  110  CO2  --  29  --  28  --  _0 21* 19*  --  19*  GLUCOSE  --  159*  --  123*  --  155* 140* 162* 140* 593*  --  141*  BUN  --  23  --  16  --  19 27* 34* 36* 35*  --  43*  CREATININE  --  1.36*  --  1.17  --  1.21 1.58* 2.48* 3.18* 3.21*  --  4.11*  ALBUMIN  --   --   --   --   --   --   --   --  2.8* 2.5*  --   --   CALCIUM  --  7.9*  --  7.9*  --  7.5* 8.2* 8.8* 8.1* 7.5*  --  8.0*  PHOS 2.5 3.4 4.1 2.8 3.7  --  4.0  --  3.2  --   --   --    Liver Function Tests: Recent Labs  Lab 01-13-2022 0321 January 13, 2022 0451  AST 253* 270*  ALT 183* 184*  ALKPHOS 63 54  BILITOT 1.0 1.2  PROT 6.2* 5.5*  ALBUMIN 2.8* 2.5*   No results for input(s): LIPASE, AMYLASE in the last 168 hours. No results for input(s): AMMONIA in the last 168 hours. CBC: Recent Labs  Lab 12/14/21 0459 12/17/21 0636 12/19/21 0626 12/19/21 2236 Jan 13, 2022 1135  WBC 14.0* 7.9 4.0 7.7  --   NEUTROABS  --  5.7  --   --   --   HGB 13.4 11.2* 11.2* 11.8* 11.9*  HCT 39.2 33.9* 34.8* 37.3*  35.0*  MCV 86.3 88.5 89.9 92.1  --   PLT 250 183 195 250  --    PT/INR: _1 (inr:5) Cardiac Enzymes: )No results for input(s): CKTOTAL, CKMB, CKMBINDEX, TROPONINI in the last 168 hours. CBG: Recent Labs  Lab 12/19/21 1927 12/19/21 2332 01-13-22 0315 January 13, 2022 0729 Jan 13, 2022 1118  GLUCAP 123* 170* 150* 122* 138*    Iron Studies: No results for input(s): IRON, TIBC, TRANSFERRIN, FERRITIN in the last 168 hours.  Xrays/Other Studies: DG Abd 1 View  Result Date: 01/13/2022 CLINICAL DATA:  Confirm G-tube placement EXAM: ABDOMEN - 1 VIEW COMPARISON:  Chest x-ray obtained earlier today FINDINGS: G-tube in good position.  The tip projects over the gastric antrum. IMPRESSION: G-tube within the stomach, tip overlies the gastric antrum in good position. Electronically Signed   By: Jacqulynn Cadet M.D.   On: 12/23/21 10:05   DG Chest Port 1 View  Result Date: 23-Dec-2021 CLINICAL DATA:  Pneumonia EXAM: PORTABLE CHEST 1 VIEW COMPARISON:  12/19/2021 FINDINGS: Tracheostomy tube and right-sided PICC line remain in place. Stable cardiomegaly. Low lung volumes. Persistent small bilateral pleural effusions with associated bibasilar opacities. No pneumothorax. IMPRESSION: Persistent small bilateral pleural effusions with associated bibasilar opacities. Electronically Signed   By: Davina Poke D.O.   On: 12/23/2021 08:06   DG CHEST PORT 1 VIEW  Result Date: 12/19/2021 CLINICAL DATA:  Hypoxia, tracheostomy tube EXAM: PORTABLE CHEST 1 VIEW COMPARISON:  12/19/2021 at 9:40 a.m. FINDINGS: Single frontal view of the chest demonstrates interval removal of the enteric catheter. Tracheostomy tube and right-sided PICC unchanged. Cardiac silhouette is enlarged. Persistent bilateral veiling opacities, right greater than left. Decreased lung volumes since prior study. No pneumothorax. IMPRESSION: 1. Decreased lung volumes, with persistent bibasilar veiling opacities consistent with effusions and  consolidation. Electronically Signed   By: Randa Ngo M.D.   On: 12/19/2021 21:19   DG CHEST PORT 1 VIEW  Result Date: 12/19/2021 CLINICAL DATA:  Hypoxia, trach present. EXAM: PORTABLE CHEST 1 VIEW COMPARISON:  Radiograph Dec 16, 2021 FINDINGS: Tracheostomy tube overlies the central air column. Enteric feeding catheter courses below the diaphragm with tip obscured by collimation. Right upper extremity PICC with tip overlying the right atrium. The heart size and mediastinal contours are unchanged. Reduced lung volumes bilaterally. Similar veiling opacities in the bilateral lung bases consistent with pleural effusions and consolidations. No acute osseous abnormality. IMPRESSION: Similar veiling opacities in the bilateral lung bases consistent with pleural effusions and consolidations. Electronically Signed   By: Dahlia Bailiff M.D.   On: 12/19/2021 09:54   DG Abd Portable 1V  Result Date: 12/19/2021 CLINICAL DATA:  To check for the feeding tube placement EXAM: PORTABLE ABDOMEN - 1 VIEW COMPARISON:  Dec 17, 2021 FINDINGS: The tip of the feeding tube now seen at the distal gastric antrum in comparison to pylorus in the previous study. There is moderate gaseous distention of the small bowel loops seen with dilated small bowel loop measuring 5.8 cm in greatest dimension at the left upper abdomen. IMPRESSION: Tip of the feeding tube is seen at the distal gastric antrum in comparison to pylorus in the previous study. Moderate gaseous distention of the small bowel loops and has increased in the interim. Electronically Signed   By: Frazier Richards M.D.   On: 12/19/2021 09:52     Assessment/Plan:  ARF, non-oliguric - presumably ischemic ATN in setting of cardiac arrest and ongoing hypotension and need of vasopressors.  Given worsening hyperkalemia and metabolic acidosis will start CVVHD via right fem HD catheter placed by PCCM today.  Discussed case with his family who were at bedside and they agreed to proceed  with CVVHD. Will use 0K/2.5Ca dialysate, 2K/2.5Ca for replacement fluids and follow K, UF 100-200 ml/hr, heparin in circuit, fixed dose.  Hyperkalemia - as above. VDRF - s/p trach, management per PCCM Atrial fibrillation with RVR - converted to sinus tach with amiodarone. Hypotension - likely drug induced (large sedation due to AMS and agitation), however may have sepsis from aspiration pneumonia. AGMA - due to ARF, should improve with CVVHD. 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   Adam Moreno January 18, 2022, 2:04 PM

## 2022-01-03 NOTE — Progress Notes (Signed)
Pt agitated and anxious, RASS +3, with HR ST and went into Afib RVR. Witnessed by RN and [place] Crane, PA at bedside. EKG obtained and per PA to give versed and start amiodarone drip.

## 2022-01-03 NOTE — Progress Notes (Addendum)
Attempted to call patient's sister x 2 and left a message at his wife's request. Will attempt again in a few minutes.  Julian Hy, DO 12-23-21 5:42 PM [facility]   I updated Mr. Corlett sister Anderson Malta via phone that he passed away this evening.  Julian Hy, DO 2021/12/23 6:21 PM [facility]

## 2022-01-03 NOTE — Progress Notes (Signed)
ANTICOAGULATION CONSULT NOTE - Initial Consult  Pharmacy Consult for Heparin Indication: atrial fibrillation  No Known Allergies  Patient Measurements: Height: 6' (182.9 cm) Weight: (!) 154.2 kg (339 lb 15.2 oz) IBW/kg (Calculated) : 77.6 Heparin Dosing Weight: 115 kg  Vital Signs: Temp: 103.1 F (39.5 C) (05/18 0315) Temp Source: Oral (05/18 0315) BP: 101/86 (05/18 0530) Pulse Rate: 134 (05/18 0530)  Labs: Recent Labs    12/17/21 0636 12/18/21 0834 12/19/21 0626 12/19/21 2236 12/25/2021 0321  HGB 11.2*  --  11.2* 11.8*  --   HCT 33.9*  --  34.8* 37.3*  --   PLT 183  --  195 250  --   CREATININE 1.17   < > 1.58* 2.48* 3.18*   < > = values in this interval not displayed.    Estimated Creatinine Clearance: 35.9 mL/min (A) (by C-G formula based on SCr of 3.18 mg/dL (H)).   Medical History: Past Medical History:  Diagnosis Date   AKI (acute kidney injury) ([place])    Depression    Hypertension    Sleep apnea    doesn't wear CPAP    Medications:  Scheduled:   bethanechol  10 mg Per Tube TID   chlorhexidine gluconate (MEDLINE KIT)  15 mL Mouth Rinse BID   Chlorhexidine Gluconate Cloth  6 each Topical Daily   clonazePAM  1 mg Per Tube BID   docusate  100 mg Per Tube BID   feeding supplement (PROSource TF)  45 mL Per Tube TID   ipratropium-albuterol  3 mL Nebulization Q6H   mouth rinse  15 mL Mouth Rinse 10 times per day   pantoprazole sodium  40 mg Per Tube Daily   polyethylene glycol  17 g Per Tube Daily   QUEtiapine  200 mg Per Tube BID   senna  1 tablet Per Tube BID   sodium chloride flush  10-40 mL Intracatheter Q12H   sodium chloride flush  3 mL Intravenous Q12H    Assessment: 66 y.o. male with sepsis and new Afib for heparin.   Lovenox 70 mg given yesterday morning for DVT prophylaxis, though with AKI, may still affect heparin level   Goal of Therapy:  Heparin level 0.3-0.7 units/ml Monitor platelets by anticoagulation protocol: Yes   Plan:  Start  heparin 1600 units/hr Check heparin level in 8 hours.   [name] 2021/12/25,5:36 AM

## 2022-01-03 NOTE — Progress Notes (Signed)
PCCM Interval Progress Note  Called to bedside by E-Link to attempt A-line placement. RT x 2 had already attempted to place L radial A-line and L radial artery did not appear suitable for another attempt.  Attempted x 3 to place R radial A-line under ultrasound guidance. On all 3 attempts, blood return obtained and wire able to thread, but catheter unable to be threaded. Procedure aborted. Maintaining with cuff pressures at present.  Lestine Mount, PA-C [facility] 01/01/2022 5:47 AM  Please see [email or web address] for pager details.  From 7A-7P if no response, please call [phone] After hours, please call ELink [phone]

## 2022-01-03 NOTE — Progress Notes (Addendum)
NAME:  Jaimere Feutz, MRN:  [id number], DOB:  [birth year], LOS: 9 ADMISSION DATE:  12/29/2021 CONSULTATION DATE:  12/09/2021 REFERRING MD:  Zada Finders - NSGY CHIEF COMPLAINT:  Hypotension   History of Present Illness:  66 year old man who presented to [facility] 5/9 for planned laminectomy/surgical resection of C1-C2 nerve sheath tumor with NSGY (Dr. Zada Finders). 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  Patient presented for scheduled laminectomy/nerve sheath tumor resection 5/9. Post-induction/intubation prior to the procedure, patient became hypotensive and it was unclear if he had a pulse (true pulselessness versus instrumentation). Epi was administered x 1 and a short course of CPR was completed with return of palpable pulse. Case was aborted. Patient was successfully extubated in the OR with appropriate mental status. Placed on Neosynephrine for BP support. Patient was transferred to ICU for further workup. PCCM was consulted for help with evaluation and medical management  Pertinent Medical History:   Past Medical History:  Diagnosis Date   AKI (acute kidney injury) ([place])    Depression    Hypertension    Sleep apnea    doesn't wear CPAP   Significant Hospital Events: Including procedures, antibiotic start and stop dates in addition to other pertinent events   5/9 - Presented to [facility] for planned C1-C2 nerve sheath tumor resection/laminectomy. Hypotensive shortly after induction/intubation requiring case to be aborted. Extubated in OR. Transferred to ICU for further cardiac workup. 5/12 extubated, Afib, cardiac arrest, emergently reintubated 5/14 extubated, desaturated to 30s%, emergently reintubated> trached 5/15 started on empiric antibiotics, PICC placed, CVC & foley out 5/17 increasing seroquel. Stopping dilaudid gtt. Working on diuresis (9 liters +); cr climbing. Multiple ELINK and bedside  encounters for agitation. HR 140s, RR in 30s-40s pulled out Cor Trak again. Fent gtt added. Desaturating. Got hypotensive w/ versed; norepi added, Spiked temp to 103. Cultures from blood and sputum sent. Getting albumin. AF w/ RVR amiodarone gtt started. Vanc started ->changed to zyvox. Renal consulted. Started Phenobarb taper   Interim History / Subjective:  Rough night. Multiple ELINK and bedside encounters for agitation. HR 140s, RR in 30s-40s pulled out Cor Trak again. Fent gtt added. Desaturating. Got hypotensive w/ versed; norepi added, Spiked temp to 103. Cultures from blood and sputum sent. Getting albumin.  AF w/ RVR amiodarone gtt started  Objective:  Blood pressure (Abnormal) 89/61, pulse 92, temperature 99.2 F (37.3 C), resp. rate (Abnormal) 23, height 6' (1.829 m), weight (Abnormal) 154.2 kg, SpO2 95 %.    Vent Mode: PRVC FiO2 (%):  [40 %-100 %] 80 % Set Rate:  [18 bmp] 18 bmp Vt Set:  [630 mL] 630 mL PEEP:  [8 cmH20-14 cmH20] 14 cmH20 Plateau Pressure:  [19 cmH20-21 cmH20] 20 cmH20   Intake/Output Summary (Last 24 hours) at 01-01-22 0854 Last data filed at Jan 01, 2022 0700 [place] per 24 hour  Intake 2857.65 ml  Output 2920 ml  Net -62.35 ml   Filed Weights   12/31/2021 0950 12/14/21 0500 12/15/21 0500  Weight: (Abnormal) 147.4 kg (Abnormal) 149.6 kg (Abnormal) 154.2 kg   Physical Examination: General currently sedated on vent  HENT NCAT trach unremarkable  Pulm decreased bases. Clear otherwise. Currently on PEEP 14 (I increased from 10) PPlat 22 Pcxr personally reviewed. R>L basilar airspace disease. Looks to be mix of consolidation and effusion  Card rrr ->back in SR on amidarone Ext diffuse anasarca pulses are strong  Neuro now sedated GU foley w/ decreased UOP  Resolved Hospital Problem List:  Medication related Hypotension anesthesia  Hypokalemia/hypomagnesemia   Assessment & Plan:  Principal Problem:   Schwannoma of spinal cord (HCC) Active Problems:    Acute respiratory failure (HCC)   Tracheostomy dependence (HCC)   Aspiration pneumonia (HCC)   Fever   Atelectasis   Sleep apnea   Obesity, Class III, BMI 40-49.9 (morbid obesity) (HCC)   AKI (acute kidney injury) (HCC)   Hypotension due to medication   Volume overload   High anion gap metabolic acidosis   Oliguria   (HFpEF) heart failure with preserved ejection fraction (HCC)   Acute metabolic encephalopathy   PAF (paroxysmal atrial fibrillation) (HCC)   Depression   Mixed hyperlipidemia   Hypertension   Anemia   Acute hypoxic respiratory failure,  now  Trach dependent s/p failed extubation x2; suspect aspiration PNA superimposed on hypoventilation and OSA, complicated by delirium and anxiety and volume overload Plan Cont full vent support at HS w/ daily ATC trial See below RE: delirium rx VAP bundle Cefepime day 4; f/u pending cultures (resent) vanc started ->change to zyvox  Eval chest via [place] Am cxr  New fever 5/18 Plan Cultures sent; abx widened  F/u cbc, and trend pct  Acute metabolic encephalopathy (multifactorial) Anxiety Plan Change narcotic to diluadid PRN; DC versed gtt Resume precedex Clonaz increased back Cont Seroquel at 200 bid Add phenobarb taper & atarax-->goal to get off gtts QTc in am   New diagnosis of atrial fibrillation with RVR, converted to sinus rhythm Acute on chronic HFpEF Plan Tele Amiodarone IV heparin   Schwannoma of C1-C2 nerve sheath, s/p C1-C2 laminectomy Plan Per N-surg   Drug induced Hypotension Plan Norepi for MAP > 65, he's maxed on neo  Changing sedation rx   AKI progressive renal failure. Now oliguric  Plan Renal dose Meds Strict I&O Keep foley  Get renal US Send urine Na, creatinine and also urine eos.could this be drug related as well Renal consult  Change vanc to zyvox   Anion gap metabolic acidosis  Plan MAP goal > 65 Trend lactate Supportive care   Fluid and Electrolyte imbalance:  (intermittent) Plan Cont to trend and replace/correct as indicated  Morbid obesity OSA, noncompliant with CPAP Plan Tubefeeds  Anemia due to critical illness -no evidence of bleeding Plan Trend CBC Transfuse for hgb < 7  H/o Hypertension Home meds: amlodipine, lasix, metoprolol succ, valsartan-HCTZ Plan Holding home meds  Best Practice: (right click and "Reselect all SmartList Selections" daily)  Diet: TF GI: PPI DVT: enoxaparin Family updates:  wife updated at bedside 5/16  My cct 40 min   [name] ACNP-BC [place] Pager # [phone] OR # [phone] if no answer

## 2022-01-03 NOTE — Progress Notes (Addendum)
BP not improved with sedation held despite 3 pressors, stress dose steroids. EKG with TWI in septal precordial leads, no STEMI. Trop 8k On heparin for Afib  -Epi gtt added -STAT echoto re-eval for RWMA, BNP, coox ordered  Julian Hy, DO 01-03-2022 3:18 PM [facility]    ABG    Component Value Date/Time   PHART 7.195 (LL) 01/03/22 1532   PCO2ART 43.7 January 03, 2022 1532   PO2ART 82 (L) 01/03/22 1532   HCO3 16.9 (L) 2022/01/03 1532   TCO2 18 (L) Jan 03, 2022 1532   ACIDBASEDEF 11.0 (H) January 03, 2022 1532   O2SAT 62.7 01-03-2022 1538  Ionized calcium 0.99   Pushing 2 amps of bicarb 1 g calcium gluconate Increase RR on vent  Julian Hy, DO Jan 03, 2022 3:54 PM [place] [facility]

## 2022-01-03 NOTE — Progress Notes (Signed)
NAME:  Adam Moreno, MRN:  [id number], DOB:  1955/12/18, LOS: 9 ADMISSION DATE:  12/17/2021 CONSULTATION DATE:  12/17/2021 REFERRING MD:  Zada Finders - NSGY CHIEF COMPLAINT:  Hypotension   History of Present Illness:  66 year old man who presented to [facility] 5/9 for planned laminectomy/surgical resection of C1-C2 nerve sheath tumor with NSGY (Dr. Zada Finders). 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  Patient presented for scheduled laminectomy/nerve sheath tumor resection 5/9. Post-induction/intubation prior to the procedure, patient became hypotensive and it was unclear if he had a pulse (true pulselessness versus instrumentation). Epi was administered x 1 and a short course of CPR was completed with return of palpable pulse. Case was aborted. Patient was successfully extubated in the OR with appropriate mental status. Placed on Neosynephrine for BP support. Patient was transferred to ICU for further workup. PCCM was consulted for help with evaluation and medical management  Pertinent Medical History:   Past Medical History:  Diagnosis Date   AKI (acute kidney injury) ([place])    Depression    Hypertension    Sleep apnea    doesn't wear CPAP   Significant Hospital Events: Including procedures, antibiotic start and stop dates in addition to other pertinent events   5/9 - Presented to [facility] for planned C1-C2 nerve sheath tumor resection/laminectomy. Hypotensive shortly after induction/intubation requiring case to be aborted. Extubated in OR. Transferred to ICU for further cardiac workup. 12/30/2021 - . 5/11 s/p uneventful C1/C2 laminectomies and resection of tumor, prelim path schwannoma 5/12 extubated, Afib, cardiac arrest, emergently reintubated 5/14 extubated, desaturated to 30s%, emergently reintubated> trached 5/15 started on empiric antibiotics, PICC placed, CVC & foley out 5/17 increasing seroquel.  Stopping dilaudid gtt. Working on diuresis (9 liters +). STill agiated  Interim History / Subjective:    12/30/2021 - called to bedsdie. Now febrile 103F.n  A Flutter.  Overnight continued agitation. PREcedex gtt -> chagne dto fent gt -> still agiated. Intermitten hhypotneion - needed levophed but currently off. AKI dramatically worse (on high dose lasix) > creat doubled to 3.'18mg'$ % . Increased to 100% fio2. RN requesting Aline due to challenees  Objective:  Blood pressure 90/65, pulse (!) 129, temperature (!) 103.1 F (39.5 C), temperature source Oral, resp. rate (!) 41, height 6' (1.829 m), weight (!) 154.2 kg, SpO2 98 %.    Vent Mode: PRVC FiO2 (%):  [40 %-100 %] 100 % Set Rate:  [18 bmp] 18 bmp Vt Set:  [630 mL] 630 mL PEEP:  [8 cmH20-12 cmH20] 10 cmH20 Plateau Pressure:  [19 cmH20-21 cmH20] 21 cmH20   Intake/Output Summary (Last 24 hours) at 12-30-21 0509 Last data filed at 12-30-21 0300 [place] per 24 hour  Intake 1477.76 ml  Output 3270 ml  Net -1792.24 ml   Filed Weights   12/09/2021 0950 12/14/21 0500 12/15/21 0500  Weight: (!) 147.4 kg (!) 149.6 kg (!) 154.2 kg   Physical Examination: General Appearance:  Looks criticall ill OBESE - + Head:  Normocephalic, without obvious abnormality, atraumatic Eyes:  PERRL - yes, conjunctiva/corneas - muddy     Ears:  Normal external ear canals, both ears Nose:  G tube - NO has Bridal Throat:  ETT TUBE - no , OG tube - no. HS TRACH Neck:  Supple,  No enlargement/tenderness/nodules Lungs: Clear to auscultation bilaterally, Ventilator   Synchrony - NO, RR 30s and some paradoxical via trach Heart:  S1 and S2 normal, no murmur, CVP - no.  Pressors - intermiten Abdomen:  Soft, no masses, no organomegaly Genitalia / Rectal:  Not done Extremities:  Extremities- intact Skin:  ntact in exposed areas . Sacral area - not 4examined Neurologic:  Sedation - feng tt -> RASS - -3 . Moves all 4s - no. CAM-ICU - cannot test . Orientation -  not     Resolved Hospital Problem List:  Medication related Hypotension anesthesia  Hypokalemia/hypomagnesemia   Assessment & Plan:  Principal Problem:   Schwannoma of spinal cord (HCC) Active Problems:   Mixed hyperlipidemia   Hypertension   Sleep apnea   Depression   Acute respiratory failure (HCC)   Tracheostomy dependence (HCC)   Aspiration pneumonia (HCC)   Atelectasis   (HFpEF) heart failure with preserved ejection fraction (HCC)   Obesity, Class III, BMI 40-49.9 (morbid obesity) ([place])   Acute metabolic encephalopathy   PAF (paroxysmal atrial fibrillation) (HCC)   Anemia   AKI (acute kidney injury) ([place])   Acute hypoxic respiratory failure,  now  Trach (size 8) dependent s/p failed extubation x2; suspect aspiration PNA superimposed on hypoventilation and OSA, complicated by delirium and anxiety   5.18 - worsening respiratyr failure with new osnet SEpsis . cXR with LL atelectasis. On 100% fio2 , peep 10 -> pulse ox 95%  Plan Check ABG FuilDc dilaudid gtt Cont precedex  Cont PSV and ATC later today once he's off gtt VAP bundle  PAD Protocol Lasix today (will do albumin and lasix chaser)->may increase dosing tomorrow if still w. Sig volume excess Cefepime day 3 (abx day 7); prob stop cefepime at day 5 (total 10d)  Am CXR   Acute metabolic encephalopathy (multifactorial) Anxiety  5/18 worsening agitation with sepsis onset. Already on precedex, seroquel, clonazepa,m melatonin  Plan Changed precedex gtt to fent gtt Add versed prn and gtt Dc precedex Continue clonazepam Continue Seroquel to 200 bid (qtc mmonitoring )  DC melatonin  New diagnosis of atrial fibrillation with RVR, converted to sinus rhythm prior to 5/18/123 Acute on chronic HFpEF and on lasix   5/18 in   A flutter in setting of fever 103F  Plan Cont tele Chagne SQ lovenoix to IV heparin gtt Amio bolus and gtt    Schwannoma of C1-C2 nerve sheath, s/p C1-C2 laminectomy  5/18 - too  sick for PT  Plan PT/OT/SLP when able Post op per surg   AKI   - 1st episode 12/07/21 - 12/15/21 and resolve - 2nd episode 12/19/21   5/18 - doubling of creat to 3.'18mg'$ % in setting of high dose lasix due to volume ooverload and also sepsis fevver 103F  Plan Fluid bolus DC lasix Recheck BMET 11am Rrenal [place]  Cont to trend chems Avoid nephrotoxins Strict I&O Avoid hypotension     Hypertension - Prior to & Present on Admit. Home meds: amlodipine, lasix, metoprolol succ, valsartan-HCTZ   5/18 having intermittentl hypotension./shock needing pressors . Likely septic   Plan Holding PTA meds for now  Fluid bolus  Dc lasix Consider neo instead of levophed due to  A Fib  Can attempt a-l;ine  A Fib RVR 5/18/.23 recurrence  Plan  - amio bolus and gtt    SIRS/sEpsis syndrome - - 2022-01-06 - fevr 103. Concern for VAP. MEWS 10  5/18 - picked up fever while on day 3 of cefepeime   Plan  - check PCT, lactate,  - check blood culture - check  UA - add vanc 2022-01-06 - continue cefepime - might need NSGY input regarding incision or  spnal infections   Fluid and Electrolyte imbalance: (intermittent)    Plan Cont to trend and replace as indicated.   Morbid obesity OSA, noncompliant with CPAP   Plan Tubefeeds  Anemia due to critical illness -no evidence of bleeding  Plan Trend cbc Trigger for transfusion < 7  - PRBC for hgb </= 6.9gm%    - exceptions are   -  if ACS susepcted/confirmed then transfuse for hgb </= 8.0gm%,  or    -  active bleeding with hemodynamic instability, then transfuse regardless of hemoglobin value   At at all times try to transfuse 1 unit prbc as possible with exception of active hemorrhage   Transaminits - new onset 01-07-22  Plan  monitor   Best Practice: (right click and "Reselect all SmartList Selections" daily)  Diet: TF GI: PPI DVT: enoxaparin Family updates:  wife updated at bedside 5/16. Wife Mase Dhondt [phone] at  5:39 AM - 2022-01-07.       ATTESTATION & SIGNATURE   The patient Adam Moreno is critically ill with multiple organ systems failure and requires high complexity decision making for assessment and support, frequent evaluation and titration of therapies, application of advanced monitoring technologies and extensive interpretation of multiple databases.   Critical Care Time devoted to patient care services described in this note is  40  Minutes. This time reflects time of care of this signee Dr Brand Males. This critical care time does not reflect procedure time, or teaching time or supervisory time of PA/NP/Med student/Med Resident etc but could involve care discussion time     Dr. Brand Males, M.D., [facility].C.P Pulmonary and Critical Care Medicine Medical Director - [facility] ICU Staff Physician, [place] [facility] Pager: [phone], If no answer or between  15:00h - 7:00h: call [phone]  01/07/2022 5:09 AM    LABS    PULMONARY Recent Labs  Lab 12/19/21 2236  HCO3 24.7  O2SAT 71.3    CBC Recent Labs  Lab 12/17/21 0636 12/19/21 0626 12/19/21 2236  HGB 11.2* 11.2* 11.8*  HCT 33.9* 34.8* 37.3*  WBC 7.9 4.0 7.7  PLT 183 195 250    COAGULATION No results for input(s): INR in the last 168 hours.  CARDIAC  No results for input(s): TROPONINI in the last 168 hours. No results for input(s): PROBNP in the last 168 hours.   CHEMISTRY Recent Labs  Lab 12/15/21 1127 12/17/21 0636 12/18/21 2637 12/18/21 0834 12/19/21 0626 12/19/21 2236 January 07, 2022 0321  NA  --  139  --  143 143 143 144  K  --  3.0*  --  3.1* 4.1 4.3 4.3  CL  --  105  --  111 109 111 113*  CO2  --  28  --  '26 26 24 '$ 21*  GLUCOSE  --  123*  --  155* 140* 162* 140*  BUN  --  16  --  19 27* 34* 36*  CREATININE  --  1.17  --  1.21 1.58* 2.48* 3.18*  CALCIUM  --  7.9*  --  7.5* 8.2* 8.8* 8.1*  MG 1.9 1.8 2.0  --  2.1  --  1.9  PHOS 4.1  2.8 3.7  --  4.0  --  3.2   Estimated Creatinine Clearance: 35.9 mL/min (A) (by C-G formula based on SCr of 3.18 mg/dL (H)).   LIVER Recent Labs  Lab Jan 07, 2022 0321  AST 253*  ALT 183*  ALKPHOS 63  BILITOT 1.0  PROT 6.2*  ALBUMIN 2.8*     INFECTIOUS No results for input(s): LATICACIDVEN, PROCALCITON in the last 168 hours.   ENDOCRINE CBG (last 3)  Recent Labs    12/19/21 1927 12/19/21 2332 2022-01-15 0315  GLUCAP 123* 170* 150*         IMAGING x48h  - image(s) personally visualized  -   highlighted in bold DG CHEST PORT 1 VIEW  Result Date: 12/19/2021 CLINICAL DATA:  Hypoxia, tracheostomy tube EXAM: PORTABLE CHEST 1 VIEW COMPARISON:  12/19/2021 at 9:40 a.m. FINDINGS: Single frontal view of the chest demonstrates interval removal of the enteric catheter. Tracheostomy tube and right-sided PICC unchanged. Cardiac silhouette is enlarged. Persistent bilateral veiling opacities, right greater than left. Decreased lung volumes since prior study. No pneumothorax. IMPRESSION: 1. Decreased lung volumes, with persistent bibasilar veiling opacities consistent with effusions and consolidation. Electronically Signed   By: Randa Ngo M.D.   On: 12/19/2021 21:19   DG CHEST PORT 1 VIEW  Result Date: 12/19/2021 CLINICAL DATA:  Hypoxia, trach present. EXAM: PORTABLE CHEST 1 VIEW COMPARISON:  Radiograph Dec 16, 2021 FINDINGS: Tracheostomy tube overlies the central air column. Enteric feeding catheter courses below the diaphragm with tip obscured by collimation. Right upper extremity PICC with tip overlying the right atrium. The heart size and mediastinal contours are unchanged. Reduced lung volumes bilaterally. Similar veiling opacities in the bilateral lung bases consistent with pleural effusions and consolidations. No acute osseous abnormality. IMPRESSION: Similar veiling opacities in the bilateral lung bases consistent with pleural effusions and consolidations. Electronically Signed   By:  Dahlia Bailiff M.D.   On: 12/19/2021 09:54   DG Abd Portable 1V  Result Date: 12/19/2021 CLINICAL DATA:  To check for the feeding tube placement EXAM: PORTABLE ABDOMEN - 1 VIEW COMPARISON:  Dec 17, 2021 FINDINGS: The tip of the feeding tube now seen at the distal gastric antrum in comparison to pylorus in the previous study. There is moderate gaseous distention of the small bowel loops seen with dilated small bowel loop measuring 5.8 cm in greatest dimension at the left upper abdomen. IMPRESSION: Tip of the feeding tube is seen at the distal gastric antrum in comparison to pylorus in the previous study. Moderate gaseous distention of the small bowel loops and has increased in the interim. Electronically Signed   By: Frazier Richards M.D.   On: 12/19/2021 09:52

## 2022-01-03 NOTE — Progress Notes (Signed)
PT Cancellation Note  Patient Details Name: Adam Moreno MRN: [id number] DOB: 11/17/1955   Cancelled Treatment:    Reason Eval/Treat Not Completed: Medical issues which prohibited therapy; overnight events noted.  Will hold PT today.   [name] 2022-01-01, 9:10 AM Magda Kiel, PT Acute Rehabilitation Services DDUKG:[phone] [phone] 01-01-2022

## 2022-01-03 NOTE — Progress Notes (Addendum)
Echo with very dilated RV, new since last week. On 4 pressors with refractory shock. I discussed with Mrs. Karl the high risk that he has a massive PE and the option of treatment with TPA. He has no potential contraindications other than his recent surgery. D/w Dr. Zada Finders, who agrees with giving lytics. We discussed the bleeding risk a/w lytics. His wife and I both agree he is not stable to go to radiology for confirmatory CT chest. She agrees with empirically treating with lytics. D/w pharmacy, full dose TPA.  Julian Hy, DO 01-01-2022 4:42 PM [place]    Bradycardic and hypotensive before TPA could be given. Epi pushed x2 with minimal response. Additional bicarb given. TNK given rather than TPA. Additional amp of epinephrine given. Mrs. Spadafore has been at bedside throughout. DNR, no compressions given. Progressive bradycardia with dropped beats on A-line. CRRT stopped. Patient expired at 17:00.  Julian Hy, DO 01/01/22 5:11 PM [place] [facility]

## 2022-01-03 NOTE — Procedures (Signed)
Central Venous Catheter Insertion Procedure Note  Mckinnley Smithey  [id number]  08/19/1955  Date:12-22-21  Time:11:14 AM   Provider Performing:Pete Johnette Abraham Kary Kos   Procedure: Insertion of Non-tunneled Central Venous Catheter(36556)with US guidance (25956)    Indication(s) Hemodialysis  Consent Risks of the procedure as well as the alternatives and risks of each were explained to the patient and/or caregiver.  Consent for the procedure was obtained and is signed in the bedside chart  Anesthesia Topical only with 1% lidocaine   Timeout Verified patient identification, verified procedure, site/side was marked, verified correct patient position, special equipment/implants available, medications/allergies/relevant history reviewed, required imaging and test results available.  Sterile Technique Maximal sterile technique including full sterile barrier drape, hand hygiene, sterile gown, sterile gloves, mask, hair covering, sterile ultrasound probe cover (if used).  Procedure Description Area of catheter insertion was cleaned with chlorhexidine and draped in sterile fashion.   With real-time ultrasound guidance a HD catheter was placed into the right femoral vein.  Nonpulsatile blood flow and easy flushing noted in all ports.  The catheter was sutured in place and sterile dressing applied.  Complications/Tolerance None; patient tolerated the procedure well. Chest X-ray is ordered to verify placement for internal jugular or subclavian cannulation.  Chest x-ray is not ordered for femoral cannulation.  EBL Minimal  Specimen(s) None   [name] ACNP-BC [place] Pager # [phone] OR # [phone] if no answer

## 2022-01-03 DEATH — deceased

## 2022-02-06 ENCOUNTER — Ambulatory Visit: Payer: BC Managed Care – PPO | Admitting: Cardiology
